# Patient Record
Sex: Female | Born: 1950 | Race: White | Hispanic: No | Smoking: Never smoker
Health system: Southern US, Community
[De-identification: ages and names within clinical notes are randomized; demographics above are authoritative.]

## PROBLEM LIST (undated history)

## (undated) DIAGNOSIS — G894 Chronic pain syndrome: Secondary | ICD-10-CM

## (undated) DIAGNOSIS — F11129 Opioid abuse with intoxication, unspecified: Secondary | ICD-10-CM

## (undated) DIAGNOSIS — F32A Depression, unspecified: Secondary | ICD-10-CM

## (undated) DIAGNOSIS — R29818 Other symptoms and signs involving the nervous system: Secondary | ICD-10-CM

## (undated) DIAGNOSIS — F1920 Other psychoactive substance dependence, uncomplicated: Secondary | ICD-10-CM

## (undated) DIAGNOSIS — M419 Scoliosis, unspecified: Secondary | ICD-10-CM

## (undated) DIAGNOSIS — Z8719 Personal history of other diseases of the digestive system: Secondary | ICD-10-CM

## (undated) DIAGNOSIS — F1190 Opioid use, unspecified, uncomplicated: Secondary | ICD-10-CM

## (undated) DIAGNOSIS — K219 Gastro-esophageal reflux disease without esophagitis: Secondary | ICD-10-CM

## (undated) DIAGNOSIS — R000 Tachycardia, unspecified: Secondary | ICD-10-CM

## (undated) DIAGNOSIS — M1990 Unspecified osteoarthritis, unspecified site: Secondary | ICD-10-CM

## (undated) DIAGNOSIS — R9431 Abnormal electrocardiogram [ECG] [EKG]: Secondary | ICD-10-CM

## (undated) DIAGNOSIS — M797 Fibromyalgia: Secondary | ICD-10-CM

## (undated) DIAGNOSIS — F419 Anxiety disorder, unspecified: Secondary | ICD-10-CM

## (undated) DIAGNOSIS — D649 Anemia, unspecified: Secondary | ICD-10-CM

## (undated) DIAGNOSIS — F1021 Alcohol dependence, in remission: Secondary | ICD-10-CM

## (undated) DIAGNOSIS — I10 Essential (primary) hypertension: Secondary | ICD-10-CM

## (undated) HISTORY — PX: TONSILLECTOMY: SUR1361

## (undated) HISTORY — PX: HERNIA REPAIR: SHX51

## (undated) HISTORY — PX: COSMETIC SURGERY: SHX468

## (undated) HISTORY — PX: MOUTH SURGERY: SHX715

## (undated) HISTORY — PX: FRACTURE SURGERY: SHX138

---

## 2014-01-03 DIAGNOSIS — F1021 Alcohol dependence, in remission: Secondary | ICD-10-CM | POA: Insufficient documentation

## 2014-06-08 DIAGNOSIS — I10 Essential (primary) hypertension: Secondary | ICD-10-CM | POA: Insufficient documentation

## 2014-07-31 DIAGNOSIS — M170 Bilateral primary osteoarthritis of knee: Secondary | ICD-10-CM | POA: Insufficient documentation

## 2014-07-31 DIAGNOSIS — J302 Other seasonal allergic rhinitis: Secondary | ICD-10-CM | POA: Insufficient documentation

## 2014-12-15 DIAGNOSIS — M129 Arthropathy, unspecified: Secondary | ICD-10-CM | POA: Insufficient documentation

## 2014-12-15 DIAGNOSIS — J309 Allergic rhinitis, unspecified: Secondary | ICD-10-CM | POA: Insufficient documentation

## 2014-12-15 DIAGNOSIS — F5104 Psychophysiologic insomnia: Secondary | ICD-10-CM | POA: Insufficient documentation

## 2014-12-23 DIAGNOSIS — M797 Fibromyalgia: Secondary | ICD-10-CM | POA: Insufficient documentation

## 2014-12-23 DIAGNOSIS — G894 Chronic pain syndrome: Secondary | ICD-10-CM | POA: Insufficient documentation

## 2015-12-17 ENCOUNTER — Other Ambulatory Visit: Payer: Self-pay | Admitting: Family Medicine

## 2015-12-17 DIAGNOSIS — Z1231 Encounter for screening mammogram for malignant neoplasm of breast: Secondary | ICD-10-CM

## 2016-01-07 ENCOUNTER — Encounter: Payer: Self-pay | Admitting: Radiology

## 2016-01-07 ENCOUNTER — Ambulatory Visit
Admission: RE | Admit: 2016-01-07 | Discharge: 2016-01-07 | Disposition: A | Discharge status: Home / Self Care | Payer: BLUE CROSS/BLUE SHIELD | Source: Ambulatory Visit | Attending: Family Medicine | Admitting: Family Medicine

## 2016-01-07 DIAGNOSIS — Z1231 Encounter for screening mammogram for malignant neoplasm of breast: Secondary | ICD-10-CM | POA: Diagnosis not present

## 2016-03-31 ENCOUNTER — Other Ambulatory Visit: Payer: Self-pay | Admitting: Family Medicine

## 2016-03-31 DIAGNOSIS — M8588 Other specified disorders of bone density and structure, other site: Secondary | ICD-10-CM

## 2016-06-02 ENCOUNTER — Emergency Department
Admission: EM | Admit: 2016-06-02 | Discharge: 2016-06-02 | Disposition: A | Discharge status: Home / Self Care | Payer: BLUE CROSS/BLUE SHIELD | Attending: Emergency Medicine | Admitting: Emergency Medicine

## 2016-06-02 ENCOUNTER — Emergency Department: Payer: BLUE CROSS/BLUE SHIELD

## 2016-06-02 ENCOUNTER — Encounter: Payer: Self-pay | Admitting: Emergency Medicine

## 2016-06-02 DIAGNOSIS — S0990XA Unspecified injury of head, initial encounter: Secondary | ICD-10-CM | POA: Diagnosis present

## 2016-06-02 DIAGNOSIS — W19XXXA Unspecified fall, initial encounter: Secondary | ICD-10-CM

## 2016-06-02 DIAGNOSIS — W01198A Fall on same level from slipping, tripping and stumbling with subsequent striking against other object, initial encounter: Secondary | ICD-10-CM | POA: Diagnosis not present

## 2016-06-02 DIAGNOSIS — S0083XA Contusion of other part of head, initial encounter: Secondary | ICD-10-CM | POA: Diagnosis not present

## 2016-06-02 DIAGNOSIS — Y929 Unspecified place or not applicable: Secondary | ICD-10-CM | POA: Diagnosis not present

## 2016-06-02 DIAGNOSIS — Z79899 Other long term (current) drug therapy: Secondary | ICD-10-CM | POA: Diagnosis not present

## 2016-06-02 DIAGNOSIS — Y999 Unspecified external cause status: Secondary | ICD-10-CM | POA: Diagnosis not present

## 2016-06-02 DIAGNOSIS — I10 Essential (primary) hypertension: Secondary | ICD-10-CM | POA: Diagnosis not present

## 2016-06-02 DIAGNOSIS — Y939 Activity, unspecified: Secondary | ICD-10-CM | POA: Insufficient documentation

## 2016-06-02 HISTORY — DX: Unspecified osteoarthritis, unspecified site: M19.90

## 2016-06-02 HISTORY — DX: Essential (primary) hypertension: I10

## 2016-06-02 HISTORY — DX: Fibromyalgia: M79.7

## 2016-06-02 NOTE — Discharge Instructions (Signed)

## 2016-06-02 NOTE — ED Notes (Signed)
Patient taken to CT scan.

## 2016-06-02 NOTE — ED Triage Notes (Signed)
Patient presents to ED via POV after a fall she had last night. Patient states she tripped and hit her head on a dresser. Patient states she went to bed and woke up with "raccoon eyes". Perorbital edema noted. Patient denies any symptoms at this time. No pain, dizziness, or vision changes. A&O x4. Patient takes a daily asa.

## 2016-06-02 NOTE — ED Provider Notes (Signed)
[facility] Emergency Department Provider Note  ____________________________________________  Time seen: Approximately 11:28 AM  I have reviewed the triage vital signs and the nursing notes.   HISTORY  Chief Complaint Fall   HPI Gina Matthews is a 66 y.o. female who presents for evaluation of face trauma status post mechanical fall. Patient reports yesterday evening she tripped on a towel on the floor and fell hitting her for head onto a drawer that was open on her vanity. She denies LOC. She takes aspirin but no other blood thinners. This morning she noticed significant oozing on her forehead and periorbitally bilaterally. She went to her PCP who sent her over here for imaging. She denies pain, dizziness, headache, face pain, changes in vision, chest pain, back pain, neck pain, abdominal pain, extremity pain. She denies feeling dizzy or having palpitations prior to her fall.  Past Medical History:  Diagnosis Date  . Arthritis   . Fibromyalgia   . Hypertension     There are no active problems to display for this patient.   History reviewed. No pertinent surgical history.  Prior to Admission medications   Medication Sig Start Date End Date Taking? Authorizing Provider  celecoxib (CELEBREX) 200 MG capsule Take 200 mg by mouth 2 (two) times daily. 05/14/16   Historical Provider, MD  DULoxetine (CYMBALTA) 30 MG capsule Take 30 mg by mouth daily. 05/21/16   Historical Provider, MD  gabapentin (NEURONTIN) 600 MG tablet Take 600 mg by mouth 3 (three) times daily. 04/03/16   Historical Provider, MD  lisinopril (PRINIVIL,ZESTRIL) 20 MG tablet Take 20 mg by mouth daily. 04/28/16   Historical Provider, MD  traMADol (ULTRAM) 50 MG tablet Take 50 mg by mouth every 8 (eight) hours as needed. 05/21/16   Historical Provider, MD  traZODone (DESYREL) 50 MG tablet Take 50 mg by mouth at bedtime. 05/18/16   Historical Provider, MD    Allergies Patient has no known  allergies.  Family History  Problem Relation Age of Onset  . Breast cancer Neg Hx     Social History Social History  Substance Use Topics  . Smoking status: Not on file  . Smokeless tobacco: Not on file  . Alcohol use Not on file    Review of Systems Constitutional: Negative for fever. Eyes: Negative for visual changes. ENT: Negative for facial injury or neck injury Cardiovascular: Negative for chest injury. Respiratory: Negative for shortness of breath. Negative for chest wall injury. Gastrointestinal: Negative for abdominal pain or injury. Genitourinary: Negative for dysuria. Musculoskeletal: Negative for back injury, negative for arm or leg pain. Skin: Negative for laceration/abrasions. Neurological: + head injury.   ____________________________________________   PHYSICAL EXAM:  VITAL SIGNS: ED Triage Vitals [06/02/16 1125]  Enc Vitals Group     BP (!) 147/102     Pulse Rate 93     Resp 17     Temp 98 F (36.7 C)     Temp Source Oral     SpO2 99 %     Weight 155 lb (70.3 kg)     Height 5\' 6"  (1.676 m)     Head Circumference      Peak Flow      Pain Score      Pain Loc      Pain Edu?      Excl. in GC?     Constitutional: Alert and oriented. No acute distress. Does not appear intoxicated. HEENT Head: Normocephalic, large forehead hematoma. Face: ttp over the  nasal bridge. Stable midface Ears: No hemotympanum bilaterally. No Battle sign Eyes: No eye injury. PERRL. B/l periorbital hematoma without tarsal plate sparing Nose: Nontender. No epistaxis. No rhinorrhea Mouth/Throat: Mucous membranes are moist. No oropharyngeal blood. No dental injury. Airway patent without stridor. Normal voice. Neck: no C-collar in place. No midline c-spine tenderness.  Cardiovascular: Normal rate, regular rhythm. Normal and symmetric distal pulses are present in all extremities. Pulmonary/Chest: Chest wall is stable and nontender to palpation/compression. Normal respiratory  effort. Breath sounds are normal. No crepitus.  Abdominal: Soft, nontender, non distended. Musculoskeletal: Nontender with normal full range of motion in all extremities. No deformities. No thoracic or lumbar midline spinal tenderness. Pelvis is stable. Skin: Skin is warm, dry and intact. No abrasions or contutions. Psychiatric: Speech and behavior are appropriate. Neurological: Normal speech and language. Moves all extremities to command. No gross focal neurologic deficits are appreciated.  Glascow Coma Score: 4 - Opens eyes on own 6 - Follows simple motor commands 5 - Alert and oriented GCS: 15   ____________________________________________   LABS (all labs ordered are listed, but only abnormal results are displayed)  Labs Reviewed - No data to display ____________________________________________  EKG  none  ____________________________________________  RADIOLOGY  CT head/ c-spine/ face: No acute intracranial abnormality.  Soft tissue swelling over the forehead and orbits. No facial or orbital fracture.  Severe kyphosis at the C3-4 level, likely related to severe degenerative disc disease. No acute bony abnormality. ____________________________________________   PROCEDURES  Procedure(s) performed: None Procedures Critical Care performed:  None ____________________________________________   INITIAL IMPRESSION / ASSESSMENT AND PLAN / ED COURSE   66 y.o. female who presents for evaluation of face trauma status post mechanical fall. Patient is neurologically intact, has a large forehead hematoma with bilateral periorbital hematoma without tarsal plate sparing. No signs or symptoms of basilar skull fracture. We'll get a head CT, CT cervical spine, and CT face.  Clinical Course as of Jun 02 1213  Tue Jun 02, 2016  1205 Imaging with no acute findings other than forehead hematoma. Will dc home on supportive care. Discuss signs and symptoms of delayed bleeding and  recommended patient returns to the ER if those develop.  [CV]    Clinical Course User Index [CV] Nita Sicklearolina Deunte Bledsoe, MD    Pertinent labs & imaging results that were available during my care of the patient were reviewed by me and considered in my medical decision making (see chart for details).    ____________________________________________   FINAL CLINICAL IMPRESSION(S) / ED DIAGNOSES  Final diagnoses:  Fall, initial encounter  Contusion of face, initial encounter  Traumatic hematoma of forehead, initial encounter      NEW MEDICATIONS STARTED DURING THIS VISIT:  New Prescriptions   No medications on file     Note:  This document was prepared using Dragon voice recognition software and may include unintentional dictation errors.    Nita Sicklearolina Kvion Shapley, MD 06/02/16 1215

## 2016-11-20 DIAGNOSIS — E871 Hypo-osmolality and hyponatremia: Secondary | ICD-10-CM | POA: Insufficient documentation

## 2017-02-22 ENCOUNTER — Other Ambulatory Visit: Payer: Self-pay | Admitting: Family Medicine

## 2017-02-22 DIAGNOSIS — Z1231 Encounter for screening mammogram for malignant neoplasm of breast: Secondary | ICD-10-CM

## 2017-03-26 ENCOUNTER — Ambulatory Visit
Admission: RE | Admit: 2017-03-26 | Discharge: 2017-03-26 | Disposition: A | Discharge status: Home / Self Care | Payer: BLUE CROSS/BLUE SHIELD | Source: Ambulatory Visit | Attending: Family Medicine | Admitting: Family Medicine

## 2017-03-26 DIAGNOSIS — Z1231 Encounter for screening mammogram for malignant neoplasm of breast: Secondary | ICD-10-CM | POA: Insufficient documentation

## 2017-03-30 ENCOUNTER — Other Ambulatory Visit: Payer: Self-pay | Admitting: Family Medicine

## 2017-03-30 DIAGNOSIS — R928 Other abnormal and inconclusive findings on diagnostic imaging of breast: Secondary | ICD-10-CM

## 2017-04-16 ENCOUNTER — Ambulatory Visit
Admission: RE | Admit: 2017-04-16 | Discharge: 2017-04-16 | Disposition: A | Discharge status: Home / Self Care | Payer: BLUE CROSS/BLUE SHIELD | Source: Ambulatory Visit | Attending: Family Medicine | Admitting: Family Medicine

## 2017-04-16 DIAGNOSIS — N6489 Other specified disorders of breast: Secondary | ICD-10-CM | POA: Insufficient documentation

## 2017-04-16 DIAGNOSIS — R928 Other abnormal and inconclusive findings on diagnostic imaging of breast: Secondary | ICD-10-CM | POA: Diagnosis present

## 2017-04-19 ENCOUNTER — Other Ambulatory Visit: Payer: Self-pay | Admitting: Family Medicine

## 2017-04-19 DIAGNOSIS — R921 Mammographic calcification found on diagnostic imaging of breast: Secondary | ICD-10-CM

## 2017-04-19 DIAGNOSIS — R928 Other abnormal and inconclusive findings on diagnostic imaging of breast: Secondary | ICD-10-CM

## 2017-04-22 ENCOUNTER — Ambulatory Visit
Admission: RE | Admit: 2017-04-22 | Discharge: 2017-04-22 | Disposition: A | Discharge status: Home / Self Care | Payer: BLUE CROSS/BLUE SHIELD | Source: Ambulatory Visit | Attending: Family Medicine | Admitting: Family Medicine

## 2017-04-22 ENCOUNTER — Other Ambulatory Visit: Payer: Self-pay | Admitting: Family Medicine

## 2017-04-22 DIAGNOSIS — R921 Mammographic calcification found on diagnostic imaging of breast: Secondary | ICD-10-CM | POA: Diagnosis not present

## 2017-04-22 DIAGNOSIS — R928 Other abnormal and inconclusive findings on diagnostic imaging of breast: Secondary | ICD-10-CM | POA: Insufficient documentation

## 2017-04-22 HISTORY — PX: BREAST BIOPSY: SHX20

## 2017-07-30 DIAGNOSIS — G8929 Other chronic pain: Secondary | ICD-10-CM | POA: Insufficient documentation

## 2017-07-30 DIAGNOSIS — M40204 Unspecified kyphosis, thoracic region: Secondary | ICD-10-CM | POA: Insufficient documentation

## 2017-07-30 DIAGNOSIS — M545 Low back pain, unspecified: Secondary | ICD-10-CM | POA: Insufficient documentation

## 2017-08-02 ENCOUNTER — Other Ambulatory Visit: Payer: Self-pay | Admitting: Pediatrics

## 2017-08-02 DIAGNOSIS — M40204 Unspecified kyphosis, thoracic region: Secondary | ICD-10-CM

## 2017-08-02 DIAGNOSIS — Z780 Asymptomatic menopausal state: Secondary | ICD-10-CM

## 2017-12-01 ENCOUNTER — Ambulatory Visit
Admission: RE | Admit: 2017-12-01 | Discharge: 2017-12-01 | Disposition: A | Discharge status: Home / Self Care | Payer: Medicare Other | Source: Ambulatory Visit | Attending: Family Medicine | Admitting: Family Medicine

## 2017-12-01 ENCOUNTER — Other Ambulatory Visit: Payer: Self-pay | Admitting: Family Medicine

## 2017-12-01 DIAGNOSIS — M25561 Pain in right knee: Principal | ICD-10-CM

## 2017-12-01 DIAGNOSIS — G8929 Other chronic pain: Secondary | ICD-10-CM

## 2018-02-15 ENCOUNTER — Other Ambulatory Visit: Payer: Self-pay | Admitting: Pediatrics

## 2018-02-15 DIAGNOSIS — Z780 Asymptomatic menopausal state: Secondary | ICD-10-CM

## 2018-03-09 ENCOUNTER — Encounter: Payer: Self-pay | Admitting: Emergency Medicine

## 2018-03-09 ENCOUNTER — Emergency Department: Payer: Medicare Other

## 2018-03-09 ENCOUNTER — Other Ambulatory Visit: Payer: Self-pay

## 2018-03-09 ENCOUNTER — Emergency Department
Admission: EM | Admit: 2018-03-09 | Discharge: 2018-03-09 | Disposition: A | Discharge status: Home / Self Care | Payer: Medicare Other | Attending: Emergency Medicine | Admitting: Emergency Medicine

## 2018-03-09 DIAGNOSIS — Z041 Encounter for examination and observation following transport accident: Secondary | ICD-10-CM | POA: Diagnosis not present

## 2018-03-09 DIAGNOSIS — R51 Headache: Secondary | ICD-10-CM | POA: Diagnosis present

## 2018-03-09 DIAGNOSIS — I10 Essential (primary) hypertension: Secondary | ICD-10-CM | POA: Insufficient documentation

## 2018-03-09 DIAGNOSIS — Z79899 Other long term (current) drug therapy: Secondary | ICD-10-CM | POA: Insufficient documentation

## 2018-03-09 DIAGNOSIS — M549 Dorsalgia, unspecified: Secondary | ICD-10-CM | POA: Diagnosis not present

## 2018-03-09 MED ORDER — METHOCARBAMOL 500 MG PO TABS: 500.0000 mg | ORAL_TABLET | Freq: Three times a day (TID) | ORAL | 0 refills | Status: AC | PRN

## 2018-03-09 MED ORDER — ACETAMINOPHEN 325 MG PO TABS
650.0000 mg | ORAL_TABLET | Freq: Once | ORAL | Status: AC
  Administered 2018-03-09: 650 mg via ORAL
  Filled 2018-03-09: qty 2
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
## 2018-03-09 NOTE — ED Notes (Signed)
Pt reports tightness with inspiration but no sharp pains
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
## 2018-03-09 NOTE — ED Notes (Signed)
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
## 2018-06-20 ENCOUNTER — Other Ambulatory Visit: Payer: Medicare Other

## 2018-09-09 ENCOUNTER — Other Ambulatory Visit: Payer: Self-pay

## 2018-09-09 ENCOUNTER — Emergency Department: Payer: Medicare Other

## 2018-09-09 DIAGNOSIS — M549 Dorsalgia, unspecified: Secondary | ICD-10-CM | POA: Diagnosis not present

## 2018-09-09 DIAGNOSIS — Z79899 Other long term (current) drug therapy: Secondary | ICD-10-CM | POA: Diagnosis not present

## 2018-09-09 DIAGNOSIS — M79604 Pain in right leg: Secondary | ICD-10-CM | POA: Insufficient documentation

## 2018-09-09 DIAGNOSIS — M25561 Pain in right knee: Secondary | ICD-10-CM | POA: Insufficient documentation

## 2018-09-09 DIAGNOSIS — G8929 Other chronic pain: Secondary | ICD-10-CM | POA: Insufficient documentation

## 2018-09-09 DIAGNOSIS — I10 Essential (primary) hypertension: Secondary | ICD-10-CM | POA: Diagnosis not present

## 2018-09-09 DIAGNOSIS — M5431 Sciatica, right side: Secondary | ICD-10-CM | POA: Insufficient documentation

## 2018-09-09 NOTE — ED Notes (Signed)
Patient transported to Ultrasound 

## 2018-09-09 NOTE — ED Triage Notes (Signed)
Patient reports right leg pain that started at knee and radiates up into hip and down to foot.  Also reports feels numb and tingling.

## 2018-09-10 ENCOUNTER — Emergency Department
Admission: EM | Admit: 2018-09-10 | Discharge: 2018-09-10 | Disposition: A | Discharge status: Home / Self Care | Payer: Medicare Other | Attending: Emergency Medicine | Admitting: Emergency Medicine

## 2018-09-10 ENCOUNTER — Emergency Department: Payer: Medicare Other

## 2018-09-10 DIAGNOSIS — M25561 Pain in right knee: Secondary | ICD-10-CM | POA: Diagnosis not present

## 2018-09-10 DIAGNOSIS — M5431 Sciatica, right side: Secondary | ICD-10-CM

## 2018-09-10 LAB — CBC WITH DIFFERENTIAL/PLATELET
Abs Immature Granulocytes: 0.02 10*3/uL (ref 0.00–0.07)
Basophils Absolute: 0 10*3/uL (ref 0.0–0.1)
Basophils Relative: 0 %
Eosinophils Absolute: 0 10*3/uL (ref 0.0–0.5)
Eosinophils Relative: 1 %
HCT: 34.4 % — ABNORMAL LOW (ref 36.0–46.0)
Hemoglobin: 10.9 g/dL — ABNORMAL LOW (ref 12.0–15.0)
Immature Granulocytes: 0 %
Lymphocytes Relative: 11 %
Lymphs Abs: 0.7 10*3/uL (ref 0.7–4.0)
MCH: 29 pg (ref 26.0–34.0)
MCHC: 31.7 g/dL (ref 30.0–36.0)
MCV: 91.5 fL (ref 80.0–100.0)
Monocytes Absolute: 0.8 10*3/uL (ref 0.1–1.0)
Monocytes Relative: 12 %
Neutro Abs: 4.8 10*3/uL (ref 1.7–7.7)
Neutrophils Relative %: 76 %
Platelets: 303 10*3/uL (ref 150–400)
RBC: 3.76 MIL/uL — ABNORMAL LOW (ref 3.87–5.11)
RDW: 13.2 % (ref 11.5–15.5)
WBC: 6.4 10*3/uL (ref 4.0–10.5)
nRBC: 0 % (ref 0.0–0.2)

## 2018-09-10 LAB — BASIC METABOLIC PANEL
Anion gap: 12 (ref 5–15)
BUN: 13 mg/dL (ref 8–23)
CO2: 20 mmol/L — ABNORMAL LOW (ref 22–32)
Calcium: 9.1 mg/dL (ref 8.9–10.3)
Chloride: 100 mmol/L (ref 98–111)
Creatinine, Ser: 0.59 mg/dL (ref 0.44–1.00)
GFR calc Af Amer: >60 mL/min (ref >60)
GFR calc non Af Amer: >60 mL/min (ref >60)
Glucose, Bld: 114 mg/dL — ABNORMAL HIGH (ref 70–99)
Potassium: 5 mmol/L (ref 3.5–5.1)
Sodium: 132 mmol/L — ABNORMAL LOW (ref 135–145)

## 2018-09-10 MED ORDER — OXYCODONE HCL 5 MG PO TABS: 5.0000 mg | ORAL_TABLET | Freq: Once | ORAL | Status: DC

## 2018-09-10 MED ORDER — FENTANYL CITRATE (PF) 100 MCG/2ML IJ SOLN
100.0000 ug | Freq: Once | INTRAMUSCULAR | Status: AC
  Administered 2018-09-10: 100 ug via INTRAVENOUS
  Filled 2018-09-10: qty 2

## 2018-09-10 MED ORDER — ONDANSETRON HCL 4 MG/2ML IJ SOLN
4.0000 mg | Freq: Once | INTRAMUSCULAR | Status: AC
  Administered 2018-09-10: 4 mg via INTRAVENOUS
  Filled 2018-09-10: qty 2

## 2018-09-10 MED ORDER — IOHEXOL 300 MG/ML  SOLN
100.0000 mL | Freq: Once | INTRAMUSCULAR | Status: AC | PRN
  Administered 2018-09-10: 100 mL via INTRAVENOUS

## 2018-09-10 MED ORDER — ACETAMINOPHEN 500 MG PO TABS: 1000.0000 mg | ORAL_TABLET | Freq: Once | ORAL | Status: DC

## 2018-09-10 NOTE — ED Provider Notes (Signed)
CT of the knee shows a Baker's cyst CT of the back shows DJD.  No acute changes will have patient follow-up with her doctor as planned.  No changes to prior discussion with patient by Dr. Luberta Robertson, Regan Rakers, MD 09/10/18 [phone]

## 2018-09-10 NOTE — ED Provider Notes (Signed)
[facility] Emergency Department Provider Note  ____________________________________________  Time seen: Approximately 5:45 AM  I have reviewed the triage vital signs and the nursing notes.   HISTORY  Chief Complaint Leg Pain   HPI Gina Matthews is a 68 y.o. female with a history of chronic bilateral knee pain, arthritis, fibromyalgia, scoliosis, chronic back pain who presents for evaluation of right knee pain.  Patient reports the pain started at 4 PM today.  She reports that she always has pain in her knees and she thought it was due to arthritis.  Usually she takes her tramadol and nonsteroidal at home and is able to manage the pain but today the pain was not manageable.  The pain is severe, worse with weightbearing, located behind her right knee radiating up and down the leg.  She is also complained of tingling of the lower leg which is new for her. No changes in her chronic back pain. No saddle anesthesia, weakness of bilateral lower extremities, urinary or bowel incontinence or retention.  Past Medical History:  Diagnosis Date  . Arthritis   . Fibromyalgia   . Hypertension     There are no active problems to display for this patient.   Past Surgical History:  Procedure Laterality Date  . BREAST BIOPSY Right 04/22/2017   Affirm Bx- path pending    Prior to Admission medications   Medication Sig Start Date End Date Taking? Authorizing Provider  celecoxib (CELEBREX) 200 MG capsule Take 200 mg by mouth 2 (two) times daily. 05/14/16   [provider]  DULoxetine (CYMBALTA) 30 MG capsule Take 30 mg by mouth daily. 05/21/16   [provider]  gabapentin (NEURONTIN) 600 MG tablet Take 600 mg by mouth 3 (three) times daily. 04/03/16   [provider]  lisinopril (PRINIVIL,ZESTRIL) 20 MG tablet Take 20 mg by mouth daily. 04/28/16   [provider]  traMADol (ULTRAM) 50 MG tablet Take 50 mg by mouth every 8 (eight) hours as  needed. 05/21/16   [provider]  traZODone (DESYREL) 50 MG tablet Take 50 mg by mouth at bedtime. 05/18/16   [provider]    Allergies Patient has no known allergies.  Family History  Problem Relation Age of Onset  . Breast cancer Neg Hx     Social History Social History   Tobacco Use  . Smoking status: Never Smoker  . Smokeless tobacco: Never Used  Substance Use Topics  . Alcohol use: Never    Frequency: Never  . Drug use: Never    Review of Systems  Constitutional: Negative for fever. Eyes: Negative for visual changes. ENT: Negative for sore throat. Neck: No neck pain  Cardiovascular: Negative for chest pain. Respiratory: Negative for shortness of breath. Gastrointestinal: Negative for abdominal pain, vomiting or diarrhea. Genitourinary: Negative for dysuria. Musculoskeletal: Negative for back pain. + R knee pain Skin: Negative for rash. Neurological: Negative for headaches, weakness or numbness. Psych: No SI or HI  ____________________________________________   PHYSICAL EXAM:  VITAL SIGNS: ED Triage Vitals  Enc Vitals Group     BP 09/09/18 2211 (!) 121/102     Pulse Rate 09/09/18 2211 76     Resp 09/09/18 2211 (!) 22     Temp 09/09/18 2211 97.8 F (36.6 C)     Temp Source 09/09/18 2211 Oral     SpO2 09/09/18 2211 100 %     Weight 09/09/18 2203 160 lb (72.6 kg)     Height 09/09/18  2203 5\' 5"  (1.651 m)     Head Circumference --      Peak Flow --      Pain Score --      Pain Loc --      Pain Edu? --      Excl. in GC? --     Constitutional: Alert and oriented, looks uncomfortable due to pain. HEENT:      Head: Normocephalic and atraumatic.         Eyes: Conjunctivae are normal. Sclera is non-icteric.       Mouth/Throat: Mucous membranes are moist.       Neck: Supple with no signs of meningismus. Cardiovascular: Regular rate and rhythm. No murmurs, gallops, or rubs. 2+ symmetrical distal pulses are present in all extremities. No  JVD. Respiratory: Normal respiratory effort. Lungs are clear to auscultation bilaterally. No wheezes, crackles, or rhonchi.  Gastrointestinal: Soft, non tender, and non distended with positive bowel sounds. No rebound or guarding. Musculoskeletal: Large palpable mass in the R popliteal fossa, no erythema or warmth, not particularly tender to palpation, patient has significant ttp over the R sciatica notch. No midline C/T/L spine tenderness Neurologic: Normal speech and language. Face is symmetric. Moving all extremities. No gross focal neurologic deficits are appreciated. Skin: Skin is warm, dry and intact. No rash noted. Psychiatric: Mood and affect are normal. Speech and behavior are normal.  ____________________________________________   LABS (all labs ordered are listed, but only abnormal results are displayed)  Labs Reviewed  CBC WITH DIFFERENTIAL/PLATELET - Abnormal; Notable for the following components:      Result Value   RBC 3.76 (*)    Hemoglobin 10.9 (*)    HCT 34.4 (*)    All other components within normal limits  BASIC METABOLIC PANEL - Abnormal; Notable for the following components:   Sodium 132 (*)    CO2 20 (*)    Glucose, Bld 114 (*)    All other components within normal limits   ____________________________________________  EKG  none  ____________________________________________  RADIOLOGY  I have personally reviewed the images performed during this visit and I agree with the Radiologist's read.   Interpretation by Radiologist:  [place] Venous Img Lower Unilateral Right  Result Date: 09/09/2018 CLINICAL DATA:  Right leg pain with numbness. EXAM: RIGHT LOWER EXTREMITY VENOUS DOPPLER ULTRASOUND TECHNIQUE: Gray-scale sonography with graded compression, as well as color Doppler and duplex ultrasound were performed to evaluate the lower extremity deep venous systems from the level of the common femoral vein and including the common femoral, femoral, profunda femoral,  popliteal and calf veins including the posterior tibial, peroneal and gastrocnemius veins when visible. The superficial great saphenous vein was also interrogated. Spectral Doppler was utilized to evaluate flow at rest and with distal augmentation maneuvers in the common femoral, femoral and popliteal veins. COMPARISON:  None. FINDINGS: Contralateral Common Femoral Vein: Respiratory phasicity is normal and symmetric with the symptomatic side. No evidence of thrombus. Normal compressibility. Common Femoral Vein: No evidence of thrombus. Normal compressibility, respiratory phasicity and response to augmentation. Saphenofemoral Junction: No evidence of thrombus. Normal compressibility and flow on color Doppler imaging. Profunda Femoral Vein: No evidence of thrombus. Normal compressibility and flow on color Doppler imaging. Femoral Vein: No evidence of thrombus. Normal compressibility, respiratory phasicity and response to augmentation. Popliteal Vein: No evidence of thrombus. Normal compressibility, respiratory phasicity and response to augmentation. Calf Veins: No evidence of thrombus. Normal compressibility and flow on color Doppler imaging. Superficial Great Saphenous Vein: No  evidence of thrombus. Normal compressibility. Venous Reflux:  None. Other Findings: There is a complex fluid collection within the right popliteal fossa measuring 5.2 x 3.2 x 3.4 cm. IMPRESSION: No evidence of deep venous thrombosis. Large complex fluid collection within the right popliteal fossa. Electronically Signed   By: Ted Mcalpineobrinka  Dimitrova M.D.   On: 09/09/2018 22:59     ____________________________________________   PROCEDURES  Procedure(s) performed: None Procedures Critical Care performed:  None ____________________________________________   INITIAL IMPRESSION / ASSESSMENT AND PLAN / ED COURSE  68 y.o. female with a history of chronic bilateral knee pain, arthritis, fibromyalgia, scoliosis, chronic back pain who  presents for evaluation of right knee pain.  Patient initially complaining of right knee pain but now she is not sure if the pain is coming from her buttock down to her leg one from her knee up to her buttock.  She has a large palpable mobile mass in her right popliteal fossa.  According to patient she has had this mass for a long time however seems bigger.  There is no erythema, warmth.  Limb is warm and well-perfused.  Motor exam is limited due to pain.  She is significantly tender to palpation over the right sciatic notch which reproduces her pain but no midline spine tenderness.  She has bilateral 2+ patella DTRs.  Doppler ultrasound was done prior to my evaluation showing a large complex fluid collection measuring 5 x 3 x 3 cm in the right popliteal fossa.  This is most likely a Baker's cyst however will get a CT to rule out abscess or malignancy.  He seems based on my exam that her pain is mostly sciatica with positive straight leg raise and tenderness over the right sciatic notch.  She has had problems with sciatica pain in the past.  There is no signs of cauda equina. CT of the knee and CT of the lumbar spine pending. Patient given fentanyl for pain.     _________________________ 6:59 AM on 09/10/2018 -----------------------------------------  CTs pending. Care transferred to Dr. Darnelle CatalanMalinda.       As part of my medical decision making, I reviewed the following data within the electronic MEDICAL RECORD NUMBER History obtained from family, Labs reviewed , Old chart reviewed, Radiograph reviewed , Notes from prior ED visits and [place] Controlled Substance Database    Pertinent labs & imaging results that were available during my care of the patient were reviewed by me and considered in my medical decision making (see chart for details).    ____________________________________________   FINAL CLINICAL IMPRESSION(S) / ED DIAGNOSES  Final diagnoses:  Right knee pain, unspecified chronicity  Right  sided sciatica      NEW MEDICATIONS STARTED DURING THIS VISIT:  ED Discharge Orders    None       Note:  This document was prepared using Dragon voice recognition software and may include unintentional dictation errors.    Don PerkingVeronese, [place], MD 09/10/18 0700

## 2018-09-10 NOTE — ED Notes (Signed)
Resting, await CT results.

## 2018-09-10 NOTE — Discharge Instructions (Addendum)
Take your pain medications as prescribed.  Return to the emergency room if you develop back pain, weakness of your legs, numbness in your groin region when wiping or cleaning, difficulty going to the bathroom for urination or bowel movement or if you become incontinent.  Otherwise follow-up with your doctor for further evaluation.

## 2018-09-10 NOTE — ED Notes (Signed)
Patient given update on wait time. Patient verbalizes understanding.  

## 2018-09-14 ENCOUNTER — Other Ambulatory Visit: Payer: Medicare Other

## 2018-10-01 DIAGNOSIS — S02401A Maxillary fracture, unspecified, initial encounter for closed fracture: Secondary | ICD-10-CM | POA: Insufficient documentation

## 2018-10-01 DIAGNOSIS — R55 Syncope and collapse: Secondary | ICD-10-CM | POA: Insufficient documentation

## 2018-10-01 DIAGNOSIS — S0993XA Unspecified injury of face, initial encounter: Secondary | ICD-10-CM | POA: Insufficient documentation

## 2018-10-01 DIAGNOSIS — R9431 Abnormal electrocardiogram [ECG] [EKG]: Secondary | ICD-10-CM | POA: Insufficient documentation

## 2018-11-01 DIAGNOSIS — M1711 Unilateral primary osteoarthritis, right knee: Secondary | ICD-10-CM | POA: Insufficient documentation

## 2019-04-24 ENCOUNTER — Other Ambulatory Visit: Payer: Self-pay | Admitting: Pediatrics

## 2019-04-24 DIAGNOSIS — Z780 Asymptomatic menopausal state: Secondary | ICD-10-CM

## 2019-04-26 ENCOUNTER — Other Ambulatory Visit: Payer: Self-pay | Admitting: Pediatrics

## 2019-04-26 DIAGNOSIS — R928 Other abnormal and inconclusive findings on diagnostic imaging of breast: Secondary | ICD-10-CM

## 2019-04-28 ENCOUNTER — Other Ambulatory Visit: Payer: Self-pay | Admitting: Pediatrics

## 2019-04-28 DIAGNOSIS — Z1231 Encounter for screening mammogram for malignant neoplasm of breast: Secondary | ICD-10-CM

## 2019-04-28 DIAGNOSIS — R928 Other abnormal and inconclusive findings on diagnostic imaging of breast: Secondary | ICD-10-CM

## 2019-05-11 ENCOUNTER — Other Ambulatory Visit: Payer: Medicare Other

## 2019-05-30 ENCOUNTER — Other Ambulatory Visit: Payer: Medicare Other

## 2019-06-22 ENCOUNTER — Ambulatory Visit
Admission: RE | Admit: 2019-06-22 | Discharge: 2019-06-22 | Disposition: A | Discharge status: Home / Self Care | Payer: Medicare Other | Source: Ambulatory Visit | Attending: Pediatrics | Admitting: Pediatrics

## 2019-06-22 DIAGNOSIS — R928 Other abnormal and inconclusive findings on diagnostic imaging of breast: Secondary | ICD-10-CM | POA: Diagnosis present

## 2019-06-22 DIAGNOSIS — Z1231 Encounter for screening mammogram for malignant neoplasm of breast: Secondary | ICD-10-CM

## 2019-06-22 DIAGNOSIS — Z780 Asymptomatic menopausal state: Secondary | ICD-10-CM | POA: Diagnosis present

## 2019-06-27 ENCOUNTER — Other Ambulatory Visit: Payer: Medicare Other

## 2019-07-17 DIAGNOSIS — M810 Age-related osteoporosis without current pathological fracture: Secondary | ICD-10-CM | POA: Insufficient documentation

## 2019-11-23 DIAGNOSIS — F331 Major depressive disorder, recurrent, moderate: Secondary | ICD-10-CM | POA: Insufficient documentation

## 2020-06-28 DIAGNOSIS — R000 Tachycardia, unspecified: Secondary | ICD-10-CM | POA: Insufficient documentation

## 2020-07-24 ENCOUNTER — Other Ambulatory Visit: Payer: Self-pay | Admitting: Pediatrics

## 2020-07-24 DIAGNOSIS — Z1231 Encounter for screening mammogram for malignant neoplasm of breast: Secondary | ICD-10-CM

## 2020-07-29 ENCOUNTER — Other Ambulatory Visit: Payer: Self-pay

## 2020-07-29 ENCOUNTER — Ambulatory Visit
Admission: RE | Admit: 2020-07-29 | Discharge: 2020-07-29 | Disposition: A | Discharge status: Home / Self Care | Payer: Medicare Other | Source: Ambulatory Visit | Attending: Pediatrics | Admitting: Pediatrics

## 2020-07-29 DIAGNOSIS — Z1231 Encounter for screening mammogram for malignant neoplasm of breast: Secondary | ICD-10-CM | POA: Insufficient documentation

## 2020-10-03 DIAGNOSIS — K219 Gastro-esophageal reflux disease without esophagitis: Secondary | ICD-10-CM | POA: Insufficient documentation

## 2020-10-03 DIAGNOSIS — K449 Diaphragmatic hernia without obstruction or gangrene: Secondary | ICD-10-CM | POA: Insufficient documentation

## 2021-01-17 NOTE — Discharge Instructions (Signed)
Instructions after Total Knee Replacement   Gina Matthews, Jr., M.D.     Dept. of Orthopaedics & Sports Medicine  [facility]  [address]  [place], [place]  [address]  Phone: [phone]   Fax: [phone]    DIET: Drink plenty of non-alcoholic fluids. Resume your normal diet. Include foods high in fiber.  ACTIVITY:  You may use crutches or a walker with weight-bearing as tolerated, unless instructed otherwise. You may be weaned off of the walker or crutches by your Physical Therapist.  Do NOT place pillows under the knee. Anything placed under the knee could limit your ability to straighten the knee.   Continue doing gentle exercises. Exercising will reduce the pain and swelling, increase motion, and prevent muscle weakness.   Please continue to use the TED compression stockings for 6 weeks. You may remove the stockings at night, but should reapply them in the morning. Do not drive or operate any equipment until instructed.  WOUND CARE:  Continue to use the PolarCare or ice packs periodically to reduce pain and swelling. You may bathe or shower after the staples are removed at the first office visit following surgery.  MEDICATIONS: You may resume your regular medications. Please take the pain medication as prescribed on the medication. Do not take pain medication on an empty stomach. You have been given a prescription for a blood thinner (Lovenox or Coumadin). Please take the medication as instructed. (NOTE: After completing a 2 week course of Lovenox, take one Enteric-coated aspirin once a day. This along with elevation will help reduce the possibility of phlebitis in your operated leg.) Do not drive or drink alcoholic beverages when taking pain medications.  CALL THE OFFICE FOR: Temperature above 101 degrees Excessive bleeding or drainage on the dressing. Excessive swelling, coldness, or paleness of the toes. Persistent nausea and vomiting.  FOLLOW-UP:  You  should have an appointment to return to the office in 10-14 days after surgery. Arrangements have been made for continuation of Physical Therapy (either home therapy or outpatient therapy).   [facility] Department Directory         [email or web address]       [email or web address]          Cardiology  Appointments: [place] - [phone] [place] - [phone]  Endocrinology  Appointments: [place] - [phone] [place] - [phone]  Gastroenterology  Appointments: [place] - [phone] [place] - [phone]        General Surgery   Appointments: [place] - [phone]  Internal Medicine/Family Medicine  Appointments: [place] - [phone] [place] - [phone] [place] - [phone]  Metabolic and Weigh Loss Surgery  Appointments: [place] - [phone]        Neurology  Appointments: [place] - [phone] [place] - [phone]  Neurosurgery  Appointments: [place] - [phone]  Obstetrics & Gynecology  Appointments: [place] - [phone] [place] - [phone]        Pediatrics  Appointments: [place] - [phone] [place] - [phone]  Physiatry  Appointments: [place] -[phone]  Physical Therapy  Appointments: [place] - [phone] [place] - [phone]        Podiatry  Appointments: [place] - [phone] [place] - [phone]  Pulmonology  Appointments: [place] - [phone]  Rheumatology  Appointments: [place] - [phone]        [place] Location: [facility]  [address] [place], [place]  [address]  [place] Location: [facility] [address] [place], [place]  [address]  [place] Location: [facility] [address] [place],   [address]    

## 2021-01-21 ENCOUNTER — Other Ambulatory Visit: Payer: Self-pay

## 2021-01-21 ENCOUNTER — Encounter
Admission: RE | Admit: 2021-01-21 | Discharge: 2021-01-21 | Disposition: A | Discharge status: Home / Self Care | Payer: Medicare Other | Source: Ambulatory Visit | Attending: Orthopedic Surgery | Admitting: Orthopedic Surgery

## 2021-01-21 VITALS — BP 164/86 | HR 70 | Temp 97.2°F | Resp 16 | Ht 63.0 in | Wt 150.0 lb

## 2021-01-21 DIAGNOSIS — Z791 Long term (current) use of non-steroidal anti-inflammatories (NSAID): Secondary | ICD-10-CM | POA: Diagnosis not present

## 2021-01-21 DIAGNOSIS — M1711 Unilateral primary osteoarthritis, right knee: Secondary | ICD-10-CM | POA: Insufficient documentation

## 2021-01-21 DIAGNOSIS — Z01818 Encounter for other preprocedural examination: Secondary | ICD-10-CM | POA: Diagnosis present

## 2021-01-21 DIAGNOSIS — I10 Essential (primary) hypertension: Secondary | ICD-10-CM | POA: Diagnosis not present

## 2021-01-21 DIAGNOSIS — Z01812 Encounter for preprocedural laboratory examination: Secondary | ICD-10-CM

## 2021-01-21 DIAGNOSIS — Z01810 Encounter for preprocedural cardiovascular examination: Secondary | ICD-10-CM | POA: Diagnosis not present

## 2021-01-21 HISTORY — DX: Anemia, unspecified: D64.9

## 2021-01-21 HISTORY — DX: Personal history of other diseases of the digestive system: Z87.19

## 2021-01-21 LAB — COMPREHENSIVE METABOLIC PANEL
ALT: 21 U/L (ref 0–44)
AST: 21 U/L (ref 15–41)
Albumin: 4 g/dL (ref 3.5–5.0)
Alkaline Phosphatase: 86 U/L (ref 38–126)
Anion gap: 9 (ref 5–15)
BUN: 10 mg/dL (ref 8–23)
CO2: 22 mmol/L (ref 22–32)
Calcium: 8.8 mg/dL — ABNORMAL LOW (ref 8.9–10.3)
Chloride: 97 mmol/L — ABNORMAL LOW (ref 98–111)
Creatinine, Ser: 0.46 mg/dL (ref 0.44–1.00)
GFR, Estimated: >60 mL/min (ref >60)
Glucose, Bld: 85 mg/dL (ref 70–99)
Potassium: 4.5 mmol/L (ref 3.5–5.1)
Sodium: 128 mmol/L — ABNORMAL LOW (ref 135–145)
Total Bilirubin: 0.7 mg/dL (ref 0.3–1.2)
Total Protein: 6.7 g/dL (ref 6.5–8.1)

## 2021-01-21 LAB — CBC
HCT: 39.7 % (ref 36.0–46.0)
Hemoglobin: 13.4 g/dL (ref 12.0–15.0)
MCH: 32.2 pg (ref 26.0–34.0)
MCHC: 33.8 g/dL (ref 30.0–36.0)
MCV: 95.4 fL (ref 80.0–100.0)
Platelets: 316 10*3/uL (ref 150–400)
RBC: 4.16 MIL/uL (ref 3.87–5.11)
RDW: 11.8 % (ref 11.5–15.5)
WBC: 4.6 10*3/uL (ref 4.0–10.5)
nRBC: 0 % (ref 0.0–0.2)

## 2021-01-21 LAB — SEDIMENTATION RATE: Sed Rate: 4 mm/hr (ref 0–30)

## 2021-01-21 LAB — TYPE AND SCREEN
ABO/RH(D): B POS
Antibody Screen: NEGATIVE

## 2021-01-21 LAB — URINALYSIS, ROUTINE W REFLEX MICROSCOPIC
Bilirubin Urine: NEGATIVE
Glucose, UA: NEGATIVE mg/dL
Hgb urine dipstick: NEGATIVE
Ketones, ur: NEGATIVE mg/dL
Leukocytes,Ua: NEGATIVE
Nitrite: NEGATIVE
Protein, ur: NEGATIVE mg/dL
Specific Gravity, Urine: 1.02 (ref 1.005–1.030)
pH: 6 (ref 5.0–8.0)

## 2021-01-21 LAB — C-REACTIVE PROTEIN: CRP: 0.5 mg/dL (ref <1.0)

## 2021-01-21 LAB — SURGICAL PCR SCREEN
MRSA, PCR: NEGATIVE
Staphylococcus aureus: NEGATIVE

## 2021-01-21 NOTE — Patient Instructions (Addendum)
Your procedure is scheduled on: Mon. 11/7 Report to registration desk in the medical mall then to Day Surgery. To find out your arrival time please call [phone] between 1PM - 3PM on Friday 11/4  Remember: Instructions that are not followed completely may result in serious medical risk,  up to and including death, or upon the discretion of your surgeon and anesthesiologist your  surgery may need to be rescheduled.     _X__ 1. Do not eat food after midnight the night before your procedure.                 No chewing gum or hard candies. You may drink clear liquids up to 2 hours                 before you are scheduled to arrive for your surgery- DO not drink clear                 liquids within 2 hours of the start of your surgery.                 Clear Liquids include:  water, apple juice without pulp, clear Gatorade, G2 or                  Gatorade Zero (avoid Red/Purple/Blue), Black Coffee or Tea (Do not add                 anything to coffee or tea). ___x__2.   Complete the "Ensure Clear Pre-surgery Clear Carbohydrate Drink"provided to you, 2 hours before arrival. **If you are diabetic you will be  provided with an alternative drink, Gatorade Zero or G2.  __X__2.  On the morning of surgery brush your teeth with toothpaste and water, you                may rinse your mouth with mouthwash if you wish.  Do not swallow any   toothpaste of mouthwash.     ___ 3.  No Alcohol for 24 hours before or after surgery.   ___ 4.  Do Not Smoke or use e-cigarettes For 24 Hours Prior to Your Surgery.                 Do not use any chewable tobacco products for at least 6 hours prior to                 Surgery.  __  5.  Do not use any recreational drugs (marijuana, cocaine, heroin, ecstasy,  MDMA or other) For at least one week prior to your surgery.    Combination of these drugs with anesthesia may have life threatening results.  ____  6.  Bring all medications with you on  the day of surgery if instructed.   __x__  7.  Notify your doctor if there is any change in your medical condition      (cold, fever, infections).     Do not wear jewelry, make-up, hairpins, clips or nail polish. Do not wear lotions, powders, or perfumes. You may wear deodorant. Do not shave 48 hours prior to surgery. Men may shave face and neck. Do not bring valuables to the hospital.    [facility] is not responsible for any belongings or valuables.  Contacts, dentures or bridgework may not be worn into surgery. Leave your suitcase in the car. After surgery it may be brought to your room. For patients admitted to the hospital, discharge time is determined  by your treatment team.   Patients discharged the day of surgery will not be allowed to drive home.   Make arrangements for someone to be with you for the first 24 hours of your Same Day Discharge.    Please read over the following fact sheets that you were given:   Incentive pirometer    _x___ Take these medicines the morning of surgery with A SIP OF WATER: Will Receive Gabapentin and Celebrex in Preop   1.  2. carvedilol (COREG) 6.25 MG tablet  3. cetirizine (ZYRTEC) 10 MG tablet if needed  4.DULoxetine HCl 60 MG CSDR  5.traMADol (ULTRAM) 50 MG tablet if needed  6.  ____ Fleet Enema (as directed)   __x__ Use CHG Soap (or wipes) as directed  ____ Use Benzoyl Peroxide Gel as instructed  ____ Use inhalers on the day of surgery  ____ Stop metformin 2 days prior to surgery    ____ Take 1/2 of usual insulin dose the night before surgery. No insulin the morning          of surgery.   ____ Stop Coumadin/Plavix/aspirin on   __x__ Stop Anti-inflammatories ibuprofen (ADVIL) 200 MG tablet aleve or aspirin products until after surgery   __x__ Stop supplements until after surgery.  Biotin 81275 MCG TABS, Multiple Vitamins-Minerals (MULTIVITAMIN WITH MINERALS) tablet  ____ Bring C-Pap to the hospital.    If you have any  questions regarding your pre-procedure instructions,  Please call Pre-admit Testing at [phone]

## 2021-01-23 ENCOUNTER — Other Ambulatory Visit
Admission: RE | Admit: 2021-01-23 | Discharge: 2021-01-23 | Disposition: A | Discharge status: Home / Self Care | Payer: Medicare Other | Source: Ambulatory Visit | Attending: Orthopedic Surgery | Admitting: Orthopedic Surgery

## 2021-01-23 ENCOUNTER — Other Ambulatory Visit: Payer: Self-pay

## 2021-01-23 DIAGNOSIS — Z20822 Contact with and (suspected) exposure to covid-19: Secondary | ICD-10-CM | POA: Insufficient documentation

## 2021-01-23 DIAGNOSIS — Z01812 Encounter for preprocedural laboratory examination: Secondary | ICD-10-CM | POA: Insufficient documentation

## 2021-01-24 LAB — SARS CORONAVIRUS 2 (TAT 6-24 HRS): SARS Coronavirus 2: NEGATIVE

## 2021-01-24 NOTE — H&P (Signed)
ORTHOPAEDIC HISTORY & PHYSICAL Michelene Gardener, [place] - 01/23/2021 9:30 AM EDT Formatting of this note is different from the original. [facility] Chief Complaint:   Chief Complaint  Patient presents with   Knee Pain  H & P RIGHT KNEE   History of Present Illness:   Gina Matthews is a 70 y.o. female that presents to clinic today for her preoperative history and evaluation. Patient presents unaccompanied. The patient is scheduled to undergo a right total knee arthroplasty on 01/27/21 by Dr. Ernest Pine. Her pain began several years ago. The pain is located primarily along the anterior aspect of the knee. She describes her pain as worse with stairs, as well as rising after sitting and squatting. She reports associated swelling with some giving way of the knee. She denies associated numbness or tingling, denies locking.   The patient's symptoms have progressed to the point that they decrease her quality of life. The patient has previously undergone conservative treatment including NSAIDS and injections to the knee without adequate control of her symptoms.  Patient states she can have a friend stay with her following surgery. Also states that her children have expressed a strong preference for patient to go to rehab facility following surgery for 1 week.  Denies significant cardiac history, history of blood clots, or lumbar surgery.   Past Medical, Surgical, Family, Social History, Allergies, Medications:   Past Medical History:  Past Medical History:  Diagnosis Date   Arthritis   Chronic pain of right knee 07/30/2017   Fibromyalgia   Gastroesophageal reflux disease with hiatal hernia 10/03/2020   Hypertension   Scoliosis   Past Surgical History:  Past Surgical History:  Procedure Laterality Date   CESAREAN SECTION 1980, 1982, 1984, 1986  x 4   foreign object extracted from stomach  swallowed an arrow as a young child   HERNIA REPAIR Left   inguinal   RHINOPLASTY  age 57 fracture repair   right foot surgery  age 52; open reduction of fracture   Current Medications:  Current Outpatient Medications  Medication Sig Dispense Refill   acetaminophen (TYLENOL) 500 MG tablet Take 1,000 mg by mouth every 8 (eight) hours as needed for Pain   alendronate (FOSAMAX) 70 MG tablet Take 70 mg by mouth every 7 (seven) days   amLODIPine (NORVASC) 5 MG tablet Take 1 tablet (5 mg total) by mouth once daily 90 tablet 3   b complex vitamins capsule Take 1 capsule by mouth once daily   biotin 10 mg Tab Take 1 tablet by mouth once daily   CALCIUM-VITAMIN D3 ORAL Take 1 caplet by mouth once daily   carvediloL (COREG) 6.25 MG tablet Take 1 tablet (6.25 mg total) by mouth 2 (two) times daily with meals 180 tablet 2   celecoxib (CELEBREX) 200 MG capsule Take 1 capsule by mouth twice daily 60 capsule 10   cetirizine (ZYRTEC) 10 mg capsule Take 10 mg by mouth once daily   cholecalciferol (VITAMIN D3) 1000 unit tablet Take 1,000 Units by mouth once daily   DULoxetine (CYMBALTA) 60 MG DR capsule Take 1 capsule (60 mg total) by mouth once daily 90 capsule 2   famotidine (PEPCID) 20 MG tablet Take 20 mg by mouth 2 (two) times daily as needed   ferrous sulfate 142 mg (45 mg iron) TbER Take 1 tablet by mouth once daily   gabapentin (NEURONTIN) 600 MG tablet 1 tab in AM, 1 at noon, 2 tabs PO  in PM 120 tablet 6   geriatric multivitamin-min tablet Take 1 tablet by mouth once daily   ibuprofen (MOTRIN) 200 MG tablet Take 600 mg by mouth 2 (two) times daily   lisinopriL (ZESTRIL) 40 MG tablet Take 1 tablet (40 mg total) by mouth once daily 90 tablet 2   traMADoL (ULTRAM) 50 mg tablet Take 1 tablet (50 mg total) by mouth Every 4 (four) hours for 304 days 180 tablet 0   traZODone (DESYREL) 50 MG tablet Take 1 tablet (50 mg total) by mouth nightly 90 tablet 2   cyanocobalamin, vitamin B-12, 1,000 mcg/mL Drop Place 1 drop under the tongue once daily   traMADoL  (ULTRAM) 50 mg tablet Take 1 tablet (50 mg total) by mouth Every 4 (four) hours for 304 days 180 tablet 0   traMADoL (ULTRAM) 50 mg tablet Take 1 tablet (50 mg total) by mouth Every 4 (four) hours for 304 days 180 tablet 0   No current facility-administered medications for this visit.   Allergies: No Known Allergies  Social History:  Social History   Socioeconomic History   Marital status: Widowed   Number of children: 4   Years of education: 14   Highest education level: Associate degree: occupational, Scientist, product/process development, or vocational program  Occupational History   Occupation: Retired- Charity fundraiser  Tobacco Use   Smoking status: Never Smoker   Smokeless tobacco: Never Used  Building services engineer Use: Never used  Substance and Sexual Activity   Alcohol use: No  Alcohol/week: 0.0 standard drinks   Drug use: No   Sexual activity: Defer  Partners: Male  Social History Narrative  Married, 4 children; 9 grandchildren  Used to live in [place], [place]  Cares for husband with Alzheimer's  LPN in outpt peds; formerly long-term care  Denies tobacco/ETOH/drug use   06/04/2020  Military Service: None  Likes/Enjoys/What fills your day?: Narrative  Home: One Story with bonus room  Your Bedrooms is on: First Level  Fewest Steps to enter the home: 5 with railings  Other persons in the home: Live alone  Pets: none  Medical equipment you use daily: None  Medical equipment available in the home: None  Dental: significant dental problems. Last screening Date: February 2022 Screening is: Up to date  Vision: Wears Reading glasses and Prescription Glasses. Last screening Date: March 2020 Screening is: Up to date  Hearing: Ringing in her ears. Last screening Date: March 2021 Screening is: Up to date  Dermatology: Denies any areas of concern on skin. Last screening Date: March 2020 Screening is: Up to date    Family History:  Family History  Problem Relation Age of Onset   Anxiety Mother   Depression Mother    Diabetes type II Mother   Hyperlipidemia (Elevated cholesterol) Mother   High blood pressure (Hypertension) Mother   Osteoarthritis Mother   Thyroid disease Mother   Coronary Artery Disease (Blocked arteries around heart) Father   Review of Systems:   A 10+ ROS was performed, reviewed, and the pertinent orthopaedic findings are documented in the HPI.   Physical Examination:   BP 124/70 (BP Location: Left upper arm, Patient Position: Sitting, BP Cuff Size: Adult)  Ht 165.1 cm (5\' 5" )  Wt 66.5 kg (146 lb 9.6 oz)  BMI 24.40 kg/m   Patient is a well-developed, well-nourished female in no acute distress. Patient has normal mood and affect. Patient is alert and oriented to person, place, and time.   HEENT: Atraumatic, normocephalic. Pupils  equal and reactive to light. Extraocular motion intact. Noninjected sclera.  Cardiovascular: Regular rate and rhythm, with no murmurs, rubs, or gallops. Distal pulses palpable. No carotid bruits.  Respiratory: Lungs clear to auscultation bilaterally.   Right Knee: Soft tissue swelling: mild Effusion: none Erythema: none Crepitance: moderate Tenderness: anterior Alignment: relative valgus Mediolateral laxity: stable Posterior sag: negative Patellar tracking: Good tracking without evidence of subluxation or tilt Atrophy: No significant atrophy.  Quadriceps tone was fair to good. Range of motion: 0/2/121 degrees  Sensation intact over the saphenous, lateral sural cutaneous, superficial fibular, and deep fibular nerve distributions.  Tests Performed/Reviewed:  X-rays  Anteroposterior, lateral, and sunrise views of the right knee were obtained. Images reveal severe loss of lateral compartment joint space with near bone-on-bone contact and significant osteophyte formation. Subchondral sclerosis is noted. Complete loss of patellofemoral joint space with erosion of the lateral trochlea also noted.  Impression:   ICD-10-CM  1. Primary  osteoarthritis of right knee M17.11   Plan:   The patient has end-stage degenerative changes of the right knee. It was explained to the patient that the condition is progressive in nature. Having failed conservative treatment, the patient has elected to proceed with a total joint arthroplasty. The patient will undergo a total joint arthroplasty with Dr. Ernest Pine. The risks of surgery, including blood clot and infection, were discussed with the patient. Measures to reduce these risks, including the use of anticoagulation, perioperative antibiotics, and early ambulation were discussed. The importance of postoperative physical therapy was discussed with the patient. The patient elects to proceed with surgery. The patient is instructed to stop all blood thinners prior to surgery. The patient is instructed to call the hospital the day before surgery to learn of the proper arrival time.   I did speak briefly with patient's son at the end of the appointment. I explained that final decision for going to rehab facility would be based on patient's progress with physical therapy in the hospital following surgery, but that as a rule we find that patients do better when they go home as long as there is adequate support.  Contact our office with any questions or concerns. Follow up as indicated, or sooner should any new problems arise, if conditions worsen, or if they are otherwise concerned.   Michelene Gardener, PA -C [facility] [address] [place], [place] [address] Phone: [phone]  This note was generated in part with voice recognition software and I apologize for any typographical errors that were not detected and corrected.  Electronically signed by Michelene Gardener, PA at 01/23/2021 5:10 PM EDT

## 2021-01-27 ENCOUNTER — Other Ambulatory Visit: Payer: Self-pay

## 2021-01-27 ENCOUNTER — Observation Stay
Admission: RE | Admit: 2021-01-27 | Discharge: 2021-01-29 | Disposition: A | Discharge status: Home / Self Care | Payer: Medicare Other | Source: Ambulatory Visit | Attending: Orthopedic Surgery | Admitting: Orthopedic Surgery

## 2021-01-27 ENCOUNTER — Ambulatory Visit: Payer: Medicare Other | Admitting: Urgent Care

## 2021-01-27 ENCOUNTER — Observation Stay: Payer: Medicare Other

## 2021-01-27 ENCOUNTER — Ambulatory Visit: Payer: Medicare Other | Admitting: Certified Registered Nurse Anesthetist

## 2021-01-27 ENCOUNTER — Encounter
Admission: RE | Disposition: A | Discharge status: Home / Self Care | Payer: Self-pay | Source: Ambulatory Visit | Attending: Orthopedic Surgery

## 2021-01-27 ENCOUNTER — Encounter: Payer: Self-pay | Admitting: Orthopedic Surgery

## 2021-01-27 DIAGNOSIS — Z96659 Presence of unspecified artificial knee joint: Secondary | ICD-10-CM

## 2021-01-27 DIAGNOSIS — Z79899 Other long term (current) drug therapy: Secondary | ICD-10-CM | POA: Insufficient documentation

## 2021-01-27 DIAGNOSIS — I10 Essential (primary) hypertension: Secondary | ICD-10-CM | POA: Diagnosis not present

## 2021-01-27 DIAGNOSIS — M1711 Unilateral primary osteoarthritis, right knee: Secondary | ICD-10-CM | POA: Diagnosis not present

## 2021-01-27 DIAGNOSIS — Z96651 Presence of right artificial knee joint: Secondary | ICD-10-CM

## 2021-01-27 DIAGNOSIS — M797 Fibromyalgia: Secondary | ICD-10-CM

## 2021-01-27 DIAGNOSIS — Z791 Long term (current) use of non-steroidal anti-inflammatories (NSAID): Secondary | ICD-10-CM

## 2021-01-27 HISTORY — PX: KNEE ARTHROPLASTY: SHX992

## 2021-01-27 LAB — ABO/RH: ABO/RH(D): B POS

## 2021-01-27 SURGERY — ARTHROPLASTY, KNEE, TOTAL, USING IMAGELESS COMPUTER-ASSISTED NAVIGATION
Anesthesia: Spinal | Site: Knee | Laterality: Right

## 2021-01-27 MED ORDER — PHENYLEPHRINE HCL (PRESSORS) 10 MG/ML IV SOLN
INTRAVENOUS | Status: DC | PRN
  Administered 2021-01-27 (×3): 100 ug via INTRAVENOUS

## 2021-01-27 MED ORDER — CELECOXIB 200 MG PO CAPS: 400.0000 mg | ORAL_CAPSULE | Freq: Once | ORAL | Status: AC

## 2021-01-27 MED ORDER — ALUM & MAG HYDROXIDE-SIMETH 200-200-20 MG/5ML PO SUSP
30.0000 mL | Freq: Once | ORAL | Status: AC
  Administered 2021-01-27: 30 mL via ORAL
  Filled 2021-01-27: qty 30

## 2021-01-27 MED ORDER — CARVEDILOL 12.5 MG PO TABS
ORAL_TABLET | ORAL | Status: AC
  Administered 2021-01-27: 6.25 mg via ORAL
  Filled 2021-01-27: qty 1

## 2021-01-27 MED ORDER — FENTANYL CITRATE (PF) 100 MCG/2ML IJ SOLN
INTRAMUSCULAR | Status: AC
  Filled 2021-01-27: qty 2

## 2021-01-27 MED ORDER — PHENYLEPHRINE HCL (PRESSORS) 10 MG/ML IV SOLN
INTRAVENOUS | Status: AC
  Filled 2021-01-27: qty 1

## 2021-01-27 MED ORDER — PROPOFOL 10 MG/ML IV BOLUS
INTRAVENOUS | Status: DC | PRN
  Administered 2021-01-27: 50 mg via INTRAVENOUS
  Administered 2021-01-27: 30 mg via INTRAVENOUS

## 2021-01-27 MED ORDER — DEXAMETHASONE SODIUM PHOSPHATE 10 MG/ML IJ SOLN: 8.0000 mg | Freq: Once | INTRAMUSCULAR | Status: DC

## 2021-01-27 MED ORDER — ACETAMINOPHEN 10 MG/ML IV SOLN
INTRAVENOUS | Status: DC | PRN
  Administered 2021-01-27: 1000 mg via INTRAVENOUS

## 2021-01-27 MED ORDER — GABAPENTIN 600 MG PO TABS
600.0000 mg | ORAL_TABLET | Freq: Two times a day (BID) | ORAL | Status: DC
  Administered 2021-01-28 – 2021-01-29 (×4): 600 mg via ORAL
  Filled 2021-01-27 (×4): qty 1

## 2021-01-27 MED ORDER — LORATADINE 10 MG PO TABS
10.0000 mg | ORAL_TABLET | Freq: Every day | ORAL | Status: DC
  Administered 2021-01-28 – 2021-01-29 (×2): 10 mg via ORAL
  Filled 2021-01-27 (×3): qty 1

## 2021-01-27 MED ORDER — FAMOTIDINE 20 MG PO TABS
ORAL_TABLET | ORAL | Status: AC
  Administered 2021-01-27: 20 mg via ORAL
  Filled 2021-01-27: qty 1

## 2021-01-27 MED ORDER — TRAMADOL HCL 50 MG PO TABS
50.0000 mg | ORAL_TABLET | ORAL | Status: DC | PRN
  Administered 2021-01-28: 50 mg via ORAL

## 2021-01-27 MED ORDER — PANTOPRAZOLE SODIUM 40 MG PO TBEC
40.0000 mg | DELAYED_RELEASE_TABLET | Freq: Two times a day (BID) | ORAL | Status: DC
  Administered 2021-01-27 – 2021-01-29 (×4): 40 mg via ORAL
  Filled 2021-01-27 (×6): qty 1

## 2021-01-27 MED ORDER — OXYCODONE HCL 5 MG PO TABS
10.0000 mg | ORAL_TABLET | ORAL | Status: DC | PRN
  Administered 2021-01-28 (×2): 10 mg via ORAL

## 2021-01-27 MED ORDER — CELECOXIB 200 MG PO CAPS
ORAL_CAPSULE | ORAL | Status: AC
  Administered 2021-01-27: 200 mg via ORAL
  Filled 2021-01-27: qty 1

## 2021-01-27 MED ORDER — MIDAZOLAM HCL 5 MG/5ML IJ SOLN
INTRAMUSCULAR | Status: DC | PRN
  Administered 2021-01-27: 2 mg via INTRAVENOUS

## 2021-01-27 MED ORDER — MENTHOL 3 MG MT LOZG
1.0000 | LOZENGE | OROMUCOSAL | Status: DC | PRN
  Filled 2021-01-27: qty 9

## 2021-01-27 MED ORDER — SODIUM CHLORIDE 0.9 % IV SOLN
INTRAVENOUS | Status: DC | PRN
  Administered 2021-01-27: 60 mL

## 2021-01-27 MED ORDER — OXYCODONE HCL 5 MG PO TABS
ORAL_TABLET | ORAL | Status: AC
  Administered 2021-01-27: 10 mg via ORAL
  Filled 2021-01-27: qty 2

## 2021-01-27 MED ORDER — LISINOPRIL 20 MG PO TABS
40.0000 mg | ORAL_TABLET | Freq: Every day | ORAL | Status: DC
  Administered 2021-01-28 – 2021-01-29 (×2): 40 mg via ORAL
  Filled 2021-01-27 (×3): qty 2

## 2021-01-27 MED ORDER — LACTATED RINGERS IV SOLN: INTRAVENOUS | Status: DC

## 2021-01-27 MED ORDER — CELECOXIB 200 MG PO CAPS
ORAL_CAPSULE | ORAL | Status: AC
  Administered 2021-01-27: 400 mg via ORAL
  Filled 2021-01-27: qty 2

## 2021-01-27 MED ORDER — DIPHENHYDRAMINE HCL 12.5 MG/5ML PO ELIX
12.5000 mg | ORAL_SOLUTION | ORAL | Status: DC | PRN
  Filled 2021-01-27: qty 10

## 2021-01-27 MED ORDER — TRAZODONE HCL 50 MG PO TABS
50.0000 mg | ORAL_TABLET | Freq: Every day | ORAL | Status: DC
  Administered 2021-01-27 – 2021-01-28 (×2): 50 mg via ORAL
  Filled 2021-01-27 (×3): qty 1

## 2021-01-27 MED ORDER — ONDANSETRON HCL 4 MG/2ML IJ SOLN: 4.0000 mg | Freq: Once | INTRAMUSCULAR | Status: DC | PRN

## 2021-01-27 MED ORDER — ACETAMINOPHEN 10 MG/ML IV SOLN
1000.0000 mg | Freq: Four times a day (QID) | INTRAVENOUS | Status: AC
  Administered 2021-01-28: 1000 mg via INTRAVENOUS
  Filled 2021-01-27: qty 100

## 2021-01-27 MED ORDER — PRONTOSAN WOUND IRRIGATION OPTIME
TOPICAL | Status: DC | PRN
  Administered 2021-01-27: 1

## 2021-01-27 MED ORDER — NEOMYCIN-POLYMYXIN B GU 40-200000 IR SOLN
Status: DC | PRN
  Administered 2021-01-27: 14 mL

## 2021-01-27 MED ORDER — BISACODYL 10 MG RE SUPP
10.0000 mg | Freq: Every day | RECTAL | Status: DC | PRN
  Filled 2021-01-27: qty 1

## 2021-01-27 MED ORDER — OXYCODONE HCL 5 MG PO TABS: 5.0000 mg | ORAL_TABLET | ORAL | Status: DC | PRN

## 2021-01-27 MED ORDER — FERROUS SULFATE 325 (65 FE) MG PO TABS
325.0000 mg | ORAL_TABLET | Freq: Two times a day (BID) | ORAL | Status: DC
  Administered 2021-01-28 – 2021-01-29 (×3): 325 mg via ORAL
  Filled 2021-01-27 (×4): qty 1

## 2021-01-27 MED ORDER — TRANEXAMIC ACID-NACL 1000-0.7 MG/100ML-% IV SOLN
1000.0000 mg | INTRAVENOUS | Status: AC
  Administered 2021-01-27: 1000 mg via INTRAVENOUS

## 2021-01-27 MED ORDER — METOCLOPRAMIDE HCL 10 MG PO TABS
10.0000 mg | ORAL_TABLET | Freq: Three times a day (TID) | ORAL | Status: DC
  Administered 2021-01-28: 10 mg via ORAL

## 2021-01-27 MED ORDER — VITAMIN D3 25 MCG (1000 UNIT) PO TABS
1000.0000 [IU] | ORAL_TABLET | Freq: Every day | ORAL | Status: DC
  Administered 2021-01-28 – 2021-01-29 (×2): 1000 [IU] via ORAL
  Filled 2021-01-27 (×3): qty 1

## 2021-01-27 MED ORDER — TRANEXAMIC ACID-NACL 1000-0.7 MG/100ML-% IV SOLN: 1000.0000 mg | Freq: Once | INTRAVENOUS | Status: AC

## 2021-01-27 MED ORDER — 0.9 % SODIUM CHLORIDE (POUR BTL) OPTIME
TOPICAL | Status: DC | PRN
  Administered 2021-01-27: 500 mL

## 2021-01-27 MED ORDER — FLEET ENEMA 7-19 GM/118ML RE ENEM: 1.0000 | ENEMA | Freq: Once | RECTAL | Status: DC | PRN

## 2021-01-27 MED ORDER — ACETAMINOPHEN 10 MG/ML IV SOLN
INTRAVENOUS | Status: AC
  Filled 2021-01-27: qty 100

## 2021-01-27 MED ORDER — MIDAZOLAM HCL 2 MG/2ML IJ SOLN
INTRAMUSCULAR | Status: AC
  Filled 2021-01-27: qty 2

## 2021-01-27 MED ORDER — CEFAZOLIN SODIUM-DEXTROSE 2-4 GM/100ML-% IV SOLN
2.0000 g | INTRAVENOUS | Status: AC
  Administered 2021-01-27: 2 g via INTRAVENOUS

## 2021-01-27 MED ORDER — OXYCODONE HCL 5 MG/5ML PO SOLN: 5.0000 mg | Freq: Once | ORAL | Status: DC | PRN

## 2021-01-27 MED ORDER — PROPOFOL 10 MG/ML IV BOLUS
INTRAVENOUS | Status: AC
  Filled 2021-01-27: qty 20

## 2021-01-27 MED ORDER — OXYCODONE HCL 5 MG PO TABS: 5.0000 mg | ORAL_TABLET | Freq: Once | ORAL | Status: DC | PRN

## 2021-01-27 MED ORDER — FAMOTIDINE 20 MG PO TABS: 20.0000 mg | ORAL_TABLET | Freq: Once | ORAL | Status: AC

## 2021-01-27 MED ORDER — LIDOCAINE HCL (CARDIAC) PF 100 MG/5ML IV SOSY
PREFILLED_SYRINGE | INTRAVENOUS | Status: DC | PRN
  Administered 2021-01-27: 60 mg via INTRAVENOUS

## 2021-01-27 MED ORDER — TRANEXAMIC ACID-NACL 1000-0.7 MG/100ML-% IV SOLN
INTRAVENOUS | Status: AC
  Filled 2021-01-27: qty 100

## 2021-01-27 MED ORDER — DULOXETINE HCL 60 MG PO CPEP
60.0000 mg | ORAL_CAPSULE | Freq: Every day | ORAL | Status: DC
  Administered 2021-01-28 – 2021-01-29 (×2): 60 mg via ORAL
  Filled 2021-01-27 (×3): qty 1

## 2021-01-27 MED ORDER — DEXAMETHASONE SODIUM PHOSPHATE 10 MG/ML IJ SOLN
INTRAMUSCULAR | Status: AC
  Filled 2021-01-27: qty 1

## 2021-01-27 MED ORDER — AMLODIPINE BESYLATE 5 MG PO TABS
5.0000 mg | ORAL_TABLET | Freq: Every day | ORAL | Status: DC
  Administered 2021-01-28 – 2021-01-29 (×2): 5 mg via ORAL
  Filled 2021-01-27 (×3): qty 1

## 2021-01-27 MED ORDER — TRANEXAMIC ACID-NACL 1000-0.7 MG/100ML-% IV SOLN
INTRAVENOUS | Status: AC
  Administered 2021-01-27: 1000 mg via INTRAVENOUS
  Filled 2021-01-27: qty 100

## 2021-01-27 MED ORDER — CARVEDILOL 12.5 MG PO TABS: 6.2500 mg | ORAL_TABLET | Freq: Two times a day (BID) | ORAL | Status: DC

## 2021-01-27 MED ORDER — PHENYLEPHRINE HCL-NACL 20-0.9 MG/250ML-% IV SOLN
INTRAVENOUS | Status: DC | PRN
  Administered 2021-01-27: 35 ug/min via INTRAVENOUS

## 2021-01-27 MED ORDER — CEFAZOLIN SODIUM-DEXTROSE 2-4 GM/100ML-% IV SOLN
INTRAVENOUS | Status: AC
  Filled 2021-01-27: qty 100

## 2021-01-27 MED ORDER — GABAPENTIN 300 MG PO CAPS
ORAL_CAPSULE | ORAL | Status: AC
  Administered 2021-01-27: 300 mg via ORAL
  Filled 2021-01-27: qty 1

## 2021-01-27 MED ORDER — FENTANYL CITRATE (PF) 100 MCG/2ML IJ SOLN
INTRAMUSCULAR | Status: DC | PRN
  Administered 2021-01-27 (×2): 50 ug via INTRAVENOUS

## 2021-01-27 MED ORDER — PROPOFOL 500 MG/50ML IV EMUL
INTRAVENOUS | Status: DC | PRN
  Administered 2021-01-27: 75 ug/kg/min via INTRAVENOUS

## 2021-01-27 MED ORDER — ONDANSETRON HCL 4 MG PO TABS: 4.0000 mg | ORAL_TABLET | Freq: Four times a day (QID) | ORAL | Status: DC | PRN

## 2021-01-27 MED ORDER — ENSURE PRE-SURGERY PO LIQD
296.0000 mL | Freq: Once | ORAL | Status: DC
  Filled 2021-01-27: qty 296

## 2021-01-27 MED ORDER — ADULT MULTIVITAMIN W/MINERALS CH
1.0000 | ORAL_TABLET | Freq: Every day | ORAL | Status: DC
Start: 2021-01-28 — End: 2021-01-29
  Administered 2021-01-28 – 2021-01-29 (×2): 1 via ORAL
  Filled 2021-01-27 (×3): qty 1

## 2021-01-27 MED ORDER — LIDOCAINE HCL (PF) 1 % IJ SOLN
INTRAMUSCULAR | Status: DC | PRN
  Administered 2021-01-27 (×3): 1 mL

## 2021-01-27 MED ORDER — DEXMEDETOMIDINE (PRECEDEX) IN NS 20 MCG/5ML (4 MCG/ML) IV SYRINGE
PREFILLED_SYRINGE | INTRAVENOUS | Status: DC | PRN
  Administered 2021-01-27: 4 ug via INTRAVENOUS

## 2021-01-27 MED ORDER — BUPIVACAINE HCL (PF) 0.25 % IJ SOLN
INTRAMUSCULAR | Status: DC | PRN
  Administered 2021-01-27: 60 mL

## 2021-01-27 MED ORDER — CHLORHEXIDINE GLUCONATE 0.12 % MT SOLN: 15.0000 mL | Freq: Once | OROMUCOSAL | Status: AC

## 2021-01-27 MED ORDER — FENTANYL CITRATE (PF) 100 MCG/2ML IJ SOLN: 25.0000 ug | INTRAMUSCULAR | Status: DC | PRN

## 2021-01-27 MED ORDER — SENNOSIDES-DOCUSATE SODIUM 8.6-50 MG PO TABS
1.0000 | ORAL_TABLET | Freq: Two times a day (BID) | ORAL | Status: DC
  Administered 2021-01-27 – 2021-01-29 (×4): 1 via ORAL
  Filled 2021-01-27 (×6): qty 1

## 2021-01-27 MED ORDER — ENOXAPARIN SODIUM 30 MG/0.3ML IJ SOSY
30.0000 mg | PREFILLED_SYRINGE | Freq: Two times a day (BID) | INTRAMUSCULAR | Status: DC
  Administered 2021-01-28 – 2021-01-29 (×3): 30 mg via SUBCUTANEOUS
  Filled 2021-01-27 (×5): qty 0.3

## 2021-01-27 MED ORDER — ACETAMINOPHEN 325 MG PO TABS: 325.0000 mg | ORAL_TABLET | Freq: Four times a day (QID) | ORAL | Status: DC | PRN

## 2021-01-27 MED ORDER — GABAPENTIN 600 MG PO TABS
1200.0000 mg | ORAL_TABLET | Freq: Every day | ORAL | Status: DC
  Administered 2021-01-27 – 2021-01-28 (×2): 1200 mg via ORAL
  Filled 2021-01-27 (×3): qty 2

## 2021-01-27 MED ORDER — ONDANSETRON HCL 4 MG/2ML IJ SOLN: 4.0000 mg | Freq: Four times a day (QID) | INTRAMUSCULAR | Status: DC | PRN

## 2021-01-27 MED ORDER — CELECOXIB 200 MG PO CAPS: 200.0000 mg | ORAL_CAPSULE | Freq: Two times a day (BID) | ORAL | Status: DC

## 2021-01-27 MED ORDER — SODIUM CHLORIDE 0.9 % IR SOLN
Status: DC | PRN
  Administered 2021-01-27: 3000 mL

## 2021-01-27 MED ORDER — MAGNESIUM HYDROXIDE 400 MG/5ML PO SUSP: 30.0000 mL | Freq: Every day | ORAL | Status: DC

## 2021-01-27 MED ORDER — PHENOL 1.4 % MT LIQD
1.0000 | OROMUCOSAL | Status: DC | PRN
  Filled 2021-01-27: qty 177

## 2021-01-27 MED ORDER — BUPIVACAINE HCL (PF) 0.5 % IJ SOLN
INTRAMUSCULAR | Status: DC | PRN
  Administered 2021-01-27: 3 mL via INTRATHECAL

## 2021-01-27 MED ORDER — CEFAZOLIN SODIUM-DEXTROSE 2-4 GM/100ML-% IV SOLN: 2.0000 g | Freq: Four times a day (QID) | INTRAVENOUS | Status: AC

## 2021-01-27 MED ORDER — GABAPENTIN 300 MG PO CAPS: 300.0000 mg | ORAL_CAPSULE | Freq: Once | ORAL | Status: AC

## 2021-01-27 MED ORDER — CEFAZOLIN SODIUM-DEXTROSE 2-4 GM/100ML-% IV SOLN
INTRAVENOUS | Status: AC
  Administered 2021-01-27: 2 g via INTRAVENOUS
  Filled 2021-01-27: qty 100

## 2021-01-27 MED ORDER — ONDANSETRON HCL 4 MG/2ML IJ SOLN
INTRAMUSCULAR | Status: DC | PRN
  Administered 2021-01-27: 4 mg via INTRAVENOUS

## 2021-01-27 MED ORDER — PROPOFOL 10 MG/ML IV BOLUS
INTRAVENOUS | Status: AC
  Filled 2021-01-27: qty 40

## 2021-01-27 MED ORDER — ORAL CARE MOUTH RINSE: 15.0000 mL | Freq: Once | OROMUCOSAL | Status: AC

## 2021-01-27 MED ORDER — ALUM & MAG HYDROXIDE-SIMETH 200-200-20 MG/5ML PO SUSP
30.0000 mL | ORAL | Status: DC | PRN
  Administered 2021-01-28: 30 mL via ORAL

## 2021-01-27 MED ORDER — SODIUM CHLORIDE 0.9 % IV SOLN: INTRAVENOUS | Status: DC

## 2021-01-27 MED ORDER — HYDROMORPHONE HCL 1 MG/ML IJ SOLN: 0.5000 mg | INTRAMUSCULAR | Status: DC | PRN

## 2021-01-27 MED ORDER — ACETAMINOPHEN 10 MG/ML IV SOLN
INTRAVENOUS | Status: AC
  Administered 2021-01-27: 1000 mg via INTRAVENOUS
  Filled 2021-01-27: qty 100

## 2021-01-27 MED ORDER — PROPOFOL 1000 MG/100ML IV EMUL
INTRAVENOUS | Status: AC
  Filled 2021-01-27: qty 100

## 2021-01-27 MED ORDER — CHLORHEXIDINE GLUCONATE 0.12 % MT SOLN
OROMUCOSAL | Status: AC
  Administered 2021-01-27: 15 mL via OROMUCOSAL
  Filled 2021-01-27: qty 15

## 2021-01-27 SURGICAL SUPPLY — 83 items
ATTUNE MED DOME PAT 32 KNEE (Knees) ×1 IMPLANT
ATTUNE MED DOME PAT 32MM KNEE (Knees) ×1 IMPLANT
ATTUNE PSFEM RTSZ5 NARCEM KNEE (Femur) ×2 IMPLANT
ATTUNE PSRP INSR SZ5 8 KNEE (Insert) ×1 IMPLANT
ATTUNE PSRP INSR SZ5 8MM KNEE (Insert) ×1 IMPLANT
BASE TIBIAL ROT PLAT SZ 5 KNEE (Knees) IMPLANT
BATTERY INSTRU NAVIGATION (MISCELLANEOUS) ×12 IMPLANT
BLADE SAW 70X12.5 (BLADE) ×3 IMPLANT
BLADE SAW 90X13X1.19 OSCILLAT (BLADE) ×3 IMPLANT
BLADE SAW 90X25X1.19 OSCILLAT (BLADE) ×3 IMPLANT
BONE CEMENT GENTAMICIN (Cement) ×6 IMPLANT
CEMENT BONE GENTAMICIN 40 (Cement) IMPLANT
COOLER POLAR GLACIER W/PUMP (MISCELLANEOUS) ×3 IMPLANT
CUFF TOURN SGL QUICK 24 (TOURNIQUET CUFF)
CUFF TOURN SGL QUICK 34 (TOURNIQUET CUFF)
CUFF TRNQT CYL 24X4X16.5-23 (TOURNIQUET CUFF) IMPLANT
CUFF TRNQT CYL 34X4.125X (TOURNIQUET CUFF) IMPLANT
DRAPE 3/4 80X56 (DRAPES) ×3 IMPLANT
DRAPE INCISE IOBAN 66X45 STRL (DRAPES) ×3 IMPLANT
DRSG DERMACEA 8X12 NADH (GAUZE/BANDAGES/DRESSINGS) ×3 IMPLANT
DRSG MEPILEX SACRM 8.7X9.8 (GAUZE/BANDAGES/DRESSINGS) ×3 IMPLANT
DRSG OPSITE POSTOP 4X14 (GAUZE/BANDAGES/DRESSINGS) ×3 IMPLANT
DRSG TEGADERM 4X4.75 (GAUZE/BANDAGES/DRESSINGS) ×3 IMPLANT
DURAPREP 26ML APPLICATOR (WOUND CARE) ×6 IMPLANT
ELECT CAUTERY BLADE 6.4 (BLADE) ×3 IMPLANT
ELECT REM PT RETURN 9FT ADLT (ELECTROSURGICAL) ×3
ELECTRODE REM PT RTRN 9FT ADLT (ELECTROSURGICAL) ×1 IMPLANT
EX-PIN ORTHOLOCK NAV 4X150 (PIN) ×6 IMPLANT
GLOVE SRG 8 PF TXTR STRL LF DI (GLOVE) ×1 IMPLANT
GLOVE SURG ENC TEXT LTX SZ7.5 (GLOVE) ×6 IMPLANT
GLOVE SURG UNDER POLY LF SZ7.5 (GLOVE) ×3 IMPLANT
GLOVE SURG UNDER POLY LF SZ8 (GLOVE) ×2
GOWN STRL REUS W/ TWL LRG LVL3 (GOWN DISPOSABLE) ×2 IMPLANT
GOWN STRL REUS W/ TWL XL LVL3 (GOWN DISPOSABLE) ×1 IMPLANT
GOWN STRL REUS W/TWL LRG LVL3 (GOWN DISPOSABLE) ×4
GOWN STRL REUS W/TWL XL LVL3 (GOWN DISPOSABLE) ×2
HEMOVAC 400CC 10FR (MISCELLANEOUS) ×3 IMPLANT
HOLDER FOLEY CATH W/STRAP (MISCELLANEOUS) ×3 IMPLANT
IRRIGATION SURGIPHOR STRL (IV SOLUTION) ×3 IMPLANT
IV NS IRRIG 3000ML ARTHROMATIC (IV SOLUTION) ×3 IMPLANT
KIT TURNOVER KIT A (KITS) ×3 IMPLANT
KNIFE SCULPS 14X20 (INSTRUMENTS) ×3 IMPLANT
LABEL OR SOLS (LABEL) ×3 IMPLANT
MANIFOLD NEPTUNE II (INSTRUMENTS) ×6 IMPLANT
NDL SAFETY ECLIPSE 18X1.5 (NEEDLE) ×1 IMPLANT
NDL SPNL 20GX3.5 QUINCKE YW (NEEDLE) ×2 IMPLANT
NEEDLE HYPO 18GX1.5 SHARP (NEEDLE) ×2
NEEDLE SPNL 20GX3.5 QUINCKE YW (NEEDLE) ×6 IMPLANT
NS IRRIG 500ML POUR BTL (IV SOLUTION) ×3 IMPLANT
PACK TOTAL KNEE (MISCELLANEOUS) ×3 IMPLANT
PAD ABD DERMACEA PRESS 5X9 (GAUZE/BANDAGES/DRESSINGS) ×4 IMPLANT
PAD CAST CTTN 4X4 STRL (SOFTGOODS) IMPLANT
PAD WRAPON POLAR KNEE (MISCELLANEOUS) ×1 IMPLANT
PADDING CAST COTTON 4X4 STRL (SOFTGOODS) ×2
PENCIL SMOKE EVACUATOR COATED (MISCELLANEOUS) ×3 IMPLANT
PIN DRILL FIX HALF THREAD (BIT) ×6 IMPLANT
PIN DRILL QUICK PACK ×4 IMPLANT
PIN FIXATION 1/8DIA X 3INL (PIN) ×3 IMPLANT
PULSAVAC PLUS IRRIG FAN TIP (DISPOSABLE) ×3
SOL PREP PVP 2OZ (MISCELLANEOUS) ×3
SOLUTION PREP PVP 2OZ (MISCELLANEOUS) ×1 IMPLANT
SPONGE DRAIN TRACH 4X4 STRL 2S (GAUZE/BANDAGES/DRESSINGS) ×3 IMPLANT
SPONGE T-LAP 18X18 ~~LOC~~+RFID (SPONGE) ×9 IMPLANT
STAPLER SKIN PROX 35W (STAPLE) ×3 IMPLANT
STOCKINETTE BIAS CUT 6 980064 (GAUZE/BANDAGES/DRESSINGS) ×2 IMPLANT
STOCKINETTE IMPERV 14X48 (MISCELLANEOUS) ×2 IMPLANT
STRAP TIBIA SHORT (MISCELLANEOUS) ×3 IMPLANT
SUCTION FRAZIER HANDLE 10FR (MISCELLANEOUS) ×2
SUCTION TUBE FRAZIER 10FR DISP (MISCELLANEOUS) ×1 IMPLANT
SUT VIC AB 0 CT1 36 (SUTURE) ×6 IMPLANT
SUT VIC AB 1 CT1 36 (SUTURE) ×6 IMPLANT
SUT VIC AB 2-0 CT2 27 (SUTURE) ×3 IMPLANT
SYR 20ML LL LF (SYRINGE) ×3 IMPLANT
SYR 30ML LL (SYRINGE) ×6 IMPLANT
TAPE PAPER 3X10 WHT MICROPORE (GAUZE/BANDAGES/DRESSINGS) ×2 IMPLANT
TIBIAL BASE ROT PLAT SZ 5 KNEE (Knees) ×3 IMPLANT
TIP FAN IRRIG PULSAVAC PLUS (DISPOSABLE) ×1 IMPLANT
TOWEL OR 17X26 4PK STRL BLUE (TOWEL DISPOSABLE) ×3 IMPLANT
TOWER CARTRIDGE SMART MIX (DISPOSABLE) ×3 IMPLANT
TRAY FOLEY MTR SLVR 16FR STAT (SET/KITS/TRAYS/PACK) ×3 IMPLANT
WATER STERILE IRR 1000ML POUR (IV SOLUTION) ×4 IMPLANT
WATER STERILE IRR 500ML POUR (IV SOLUTION) ×3 IMPLANT
WRAPON POLAR PAD KNEE (MISCELLANEOUS) ×3

## 2021-01-27 NOTE — Anesthesia Procedure Notes (Signed)
Spinal  Patient location during procedure: OR Start time: 01/27/2021 12:38 PM End time: 01/27/2021 12:58 PM Reason for block: surgical anesthesia Staffing Performed: anesthesiologist and resident/CRNA  Anesthesiologist: Ramsdell, Kathryn F, MD Resident/CRNA: Smith, Adam, CRNA Preanesthetic Checklist Completed: patient identified, IV checked, site marked, risks and benefits discussed, surgical consent, monitors and equipment checked, pre-op evaluation and timeout performed Spinal Block Patient position: sitting Prep: Betadine and ChloraPrep Patient monitoring: heart rate, cardiac monitor, continuous pulse ox and blood pressure Approach: midline Location: L4-5 Injection technique: single-shot Needle Needle type: Whitacre and Introducer  Needle gauge: 24 G Needle length: 9 cm Assessment Events: CSF return Additional Notes Negative paresthesia. Negative blood return. Positive free-flowing CSF. Expiration date of kit checked and confirmed. Patient tolerated procedure well, without complications. Difficult spinal due to scoliosis, required several attempts.        

## 2021-01-27 NOTE — H&P (Signed)
The patient has been re-examined, and the chart reviewed, and there have been no interval changes to the documented history and physical.    The risks, benefits, and alternatives have been discussed at length. The patient expressed understanding of the risks benefits and agreed with plans for surgical intervention.  [place]ans[place] Sigmon P. Mai Longnecker, Jr. M.D.    

## 2021-01-27 NOTE — Anesthesia Preprocedure Evaluation (Addendum)
Anesthesia Evaluation  Patient identified by MRN, date of birth, ID band Patient awake    Reviewed: Allergy & Precautions, H&P , NPO status , Patient's Chart, lab work & pertinent test results  History of Anesthesia Complications Negative for: history of anesthetic complications  Airway Mallampati: II  TM Distance: >3 FB Neck ROM: Full    Dental   Implants:   Pulmonary neg pulmonary ROS, neg sleep apnea, neg COPD,    Pulmonary exam normal        Cardiovascular hypertension, (-) angina(-) Past MI and (-) Cardiac Stents Normal cardiovascular exam(-) dysrhythmias      Neuro/Psych PSYCHIATRIC DISORDERS Depression Chronic pain negative neurological ROS     GI/Hepatic Neg liver ROS, hiatal hernia, GERD  ,  Endo/Other  negative endocrine ROS  Renal/GU negative Renal ROS  negative genitourinary   Musculoskeletal  (+) Arthritis , Fibromyalgia -  Abdominal   Peds  Hematology negative hematology ROS (+)   Anesthesia Other Findings Past Medical History: No date: Anemia No date: Arthritis No date: Fibromyalgia No date: History of hiatal hernia No date: Hypertension  Past Surgical History: No date: CESAREAN SECTION     Comment:  x 4 No date: COSMETIC SURGERY     Comment:  lip injury No date: FRACTURE SURGERY; Right     Comment:  foot ORIF No date: HERNIA REPAIR; Left     Comment:  inguinal No date: MOUTH SURGERY     Comment:  implants No date: TONSILLECTOMY     Reproductive/Obstetrics negative OB ROS                            Anesthesia Physical Anesthesia Plan  ASA: 2  Anesthesia Plan: Spinal   Post-op Pain Management:    Induction:   PONV Risk Score and Plan: Propofol infusion  Airway Management Planned: Natural Airway  Additional Equipment:   Intra-op Plan:   Post-operative Plan:   Informed Consent: I have reviewed the patients History and Physical, chart, labs and  discussed the procedure including the risks, benefits and alternatives for the proposed anesthesia with the patient or authorized representative who has indicated his/her understanding and acceptance.     Dental Advisory Given  Plan Discussed with: Anesthesiologist, CRNA and Surgeon  Anesthesia Plan Comments:        Anesthesia Quick Evaluation

## 2021-01-27 NOTE — Transfer of Care (Signed)
Immediate Anesthesia Transfer of Care Note  Patient: Gina Matthews  Procedure(s) Performed: COMPUTER ASSISTED TOTAL KNEE ARTHROPLASTY (Right: Knee)  Patient Location: PACU  Anesthesia Type:Spinal  Level of Consciousness: awake, alert  and oriented  Airway & Oxygen Therapy: Patient Spontanous Breathing  Post-op Assessment: Report given to RN and Post -op Vital signs reviewed and stable  Post vital signs: Reviewed and stable  Last Vitals:  Vitals Value Taken Time  BP 112/78 01/27/21 1647  Temp    Pulse 86 01/27/21 1650  Resp 23 01/27/21 1650  SpO2 96 % 01/27/21 1650  Vitals shown include unvalidated device data.  Last Pain:  Vitals:   01/27/21 1154  TempSrc: Temporal  PainSc: 0-No pain         Complications: No notable events documented.

## 2021-01-27 NOTE — Anesthesia Postprocedure Evaluation (Signed)
Anesthesia Post Note  Patient: Konni Kesinger  Procedure(s) Performed: COMPUTER ASSISTED TOTAL KNEE ARTHROPLASTY (Right: Knee)  Patient location during evaluation: PACU Anesthesia Type: Combined General/Spinal Level of consciousness: awake and alert Pain management: pain level controlled Vital Signs Assessment: post-procedure vital signs reviewed and stable Respiratory status: spontaneous breathing, nonlabored ventilation and respiratory function stable Cardiovascular status: blood pressure returned to baseline and stable Postop Assessment: no apparent nausea or vomiting and spinal receding Anesthetic complications: no   No notable events documented.   Last Vitals:  Vitals:   01/27/21 1825 01/27/21 2000  BP: 113/75 119/73  Pulse: 72 81  Resp: 18 18  Temp: (!) 36.3 C (!) 36.1 C  SpO2: 99% 99%    Last Pain:  Vitals:   01/27/21 2000  TempSrc: Temporal  PainSc:                  Foye Deer

## 2021-01-27 NOTE — Op Note (Signed)
OPERATIVE NOTE  DATE OF SURGERY:  01/27/2021  PATIENT NAME:  Gina Matthews   DOB: [birth year]  MRN: [id number]  PRE-OPERATIVE DIAGNOSIS: Degenerative arthrosis of the right knee, primary  POST-OPERATIVE DIAGNOSIS:  Same  PROCEDURE:  Right total knee arthroplasty using computer-assisted navigation  SURGEON:  Jena Gauss. M.D.  ASSISTANT: Baldwin Jamaica, PA-C (present and scrubbed throughout the case, critical for assistance with exposure, retraction, instrumentation, and closure)  ANESTHESIA: spinal  ESTIMATED BLOOD LOSS: 75 mL  FLUIDS REPLACED: 1300 mL of crystalloid  TOURNIQUET TIME: 98 minutes  DRAINS: 2 medium Hemovac drains  SOFT TISSUE RELEASES: Anterior cruciate ligament, posterior cruciate ligament, deep  medial collateral ligament, patellofemoral ligament  IMPLANTS UTILIZED: DePuy Attune size 5N posterior stabilized femoral component (cemented), size 5 rotating platform tibial component (cemented), 32 mm medialized dome patella (cemented), and an 8 mm stabilized rotating platform polyethylene insert.  INDICATIONS FOR SURGERY: Gina Matthews is a 70 y.o. year old female with a long history of progressive knee pain. X-rays demonstrated severe degenerative changes in tricompartmental fashion. The patient had not seen any significant improvement despite conservative nonsurgical intervention. After discussion of the risks and benefits of surgical intervention, the patient expressed understanding of the risks benefits and agree with plans for total knee arthroplasty.   The risks, benefits, and alternatives were discussed at length including but not limited to the risks of infection, bleeding, nerve injury, stiffness, blood clots, the need for revision surgery, cardiopulmonary complications, among others, and they were willing to proceed.  PROCEDURE IN DETAIL: The patient was brought into the operating room and, after adequate spinal anesthesia was achieved, a tourniquet was  placed on the patient's upper thigh. The patient's knee and leg were cleaned and prepped with alcohol and DuraPrep and draped in the usual sterile fashion. A "timeout" was performed as per usual protocol. The lower extremity was exsanguinated using an Esmarch, and the tourniquet was inflated to 300 mmHg. An anterior longitudinal incision was made followed by a standard mid vastus approach. The deep fibers of the medial collateral ligament were elevated in a subperiosteal fashion off of the medial flare of the tibia so as to maintain a continuous soft tissue sleeve. The patella was subluxed laterally and the patellofemoral ligament was incised. Inspection of the knee demonstrated severe degenerative changes with full-thickness loss of articular cartilage. Osteophytes were debrided using a rongeur. Anterior and posterior cruciate ligaments were excised. Two 4.0 mm Schanz pins were inserted in the femur and into the tibia for attachment of the array of trackers used for computer-assisted navigation. Hip center was identified using a circumduction technique. Distal landmarks were mapped using the computer. The distal femur and proximal tibia were mapped using the computer. The distal femoral cutting guide was positioned using computer-assisted navigation so as to achieve a 5 distal valgus cut. The femur was sized and it was felt that a size 5N femoral component was appropriate. A size 5 femoral cutting guide was positioned and the anterior cut was performed and verified using the computer. This was followed by completion of the posterior and chamfer cuts. Femoral cutting guide for the central box was then positioned in the center box cut was performed.  Attention was then directed to the proximal tibia. Medial and lateral menisci were excised. The extramedullary tibial cutting guide was positioned using computer-assisted navigation so as to achieve a 0 varus-valgus alignment and 3 posterior slope. The cut was  performed and verified using the computer. The proximal tibia was sized  and it was felt that a size 5 tibial tray was appropriate. Tibial and femoral trials were inserted followed by insertion of an 8 mm polyethylene insert. This allowed for excellent mediolateral soft tissue balancing both in flexion and in full extension. Finally, the patella was cut and prepared so as to accommodate a 32 mm medialized dome patella. A patella trial was placed and the knee was placed through a range of motion with excellent patellar tracking appreciated. The femoral trial was removed after debridement of posterior osteophytes. The central post-hole for the tibial component was reamed followed by insertion of a keel punch. Tibial trials were then removed. Cut surfaces of bone were irrigated with copious amounts of normal saline using pulsatile lavage and then suctioned dry. Polymethylmethacrylate cement with gentamicin was prepared in the usual fashion using a vacuum mixer. Cement was applied to the cut surface of the proximal tibia as well as along the undersurface of a size 5 rotating platform tibial component. Tibial component was positioned and impacted into place. Excess cement was removed using Personal assistant. Cement was then applied to the cut surfaces of the femur as well as along the posterior flanges of the size 5N femoral component. The femoral component was positioned and impacted into place. Excess cement was removed using Personal assistant. An 8 mm polyethylene trial was inserted and the knee was brought into full extension with steady axial compression applied. Finally, cement was applied to the backside of a 32 mm medialized dome patella and the patellar component was positioned and patellar clamp applied. Excess cement was removed using Personal assistant. After adequate curing of the cement, the tourniquet was deflated after a total tourniquet time of 98 minutes. Hemostasis was achieved using electrocautery. The knee  was irrigated with copious amounts of normal saline using pulsatile lavage followed by 500 ml of Surgiphor and then suctioned dry. 20 mL of 1.3% Exparel and 60 mL of 0.25% Marcaine in 40 mL of normal saline was injected along the posterior capsule, medial and lateral gutters, and along the arthrotomy site. An 8 mm stabilized rotating platform polyethylene insert was inserted and the knee was placed through a range of motion with excellent mediolateral soft tissue balancing appreciated and excellent patellar tracking noted. 2 medium drains were placed in the wound bed and brought out through separate stab incisions. The medial parapatellar portion of the incision was reapproximated using interrupted sutures of #1 Vicryl. Subcutaneous tissue was approximated in layers using first #0 Vicryl followed #2-0 Vicryl. The skin was approximated with skin staples. A sterile dressing was applied.  The patient tolerated the procedure well and was transported to the recovery room in stable condition.    Shahzad Thomann P. Angie Fava., M.D.

## 2021-01-28 ENCOUNTER — Encounter: Payer: Self-pay | Admitting: Orthopedic Surgery

## 2021-01-28 DIAGNOSIS — M1711 Unilateral primary osteoarthritis, right knee: Secondary | ICD-10-CM | POA: Diagnosis not present

## 2021-01-28 MED ORDER — OXYCODONE HCL 5 MG PO TABS
ORAL_TABLET | ORAL | Status: AC
  Administered 2021-01-28: 10 mg via ORAL
  Filled 2021-01-28: qty 2

## 2021-01-28 MED ORDER — TRAMADOL HCL 50 MG PO TABS
ORAL_TABLET | ORAL | Status: AC
  Filled 2021-01-28: qty 1

## 2021-01-28 MED ORDER — TRAMADOL HCL 50 MG PO TABS
ORAL_TABLET | ORAL | Status: AC
  Administered 2021-01-28: 50 mg via ORAL
  Filled 2021-01-28: qty 1

## 2021-01-28 MED ORDER — CELECOXIB 200 MG PO CAPS
ORAL_CAPSULE | ORAL | Status: AC
  Administered 2021-01-28: 200 mg via ORAL
  Filled 2021-01-28: qty 1

## 2021-01-28 MED ORDER — METOCLOPRAMIDE HCL 10 MG PO TABS
ORAL_TABLET | ORAL | Status: AC
  Administered 2021-01-28: 10 mg via ORAL
  Filled 2021-01-28: qty 1

## 2021-01-28 MED ORDER — OXYCODONE HCL 5 MG PO TABS
ORAL_TABLET | ORAL | Status: AC
  Filled 2021-01-28: qty 2

## 2021-01-28 MED ORDER — ALUM & MAG HYDROXIDE-SIMETH 200-200-20 MG/5ML PO SUSP
ORAL | Status: AC
  Filled 2021-01-28: qty 30

## 2021-01-28 MED ORDER — CARVEDILOL 12.5 MG PO TABS
ORAL_TABLET | ORAL | Status: AC
  Administered 2021-01-28: 6.25 mg via ORAL
  Filled 2021-01-28: qty 1

## 2021-01-28 MED ORDER — ACETAMINOPHEN 10 MG/ML IV SOLN
INTRAVENOUS | Status: AC
  Administered 2021-01-28: 1000 mg via INTRAVENOUS
  Filled 2021-01-28: qty 100

## 2021-01-28 MED ORDER — SODIUM CHLORIDE FLUSH 0.9 % IV SOLN
INTRAVENOUS | Status: AC
  Filled 2021-01-28: qty 10

## 2021-01-28 MED ORDER — CEFAZOLIN SODIUM-DEXTROSE 2-4 GM/100ML-% IV SOLN
INTRAVENOUS | Status: AC
  Administered 2021-01-28: 2 g via INTRAVENOUS
  Filled 2021-01-28: qty 100

## 2021-01-28 MED ORDER — METOCLOPRAMIDE HCL 10 MG PO TABS
ORAL_TABLET | ORAL | Status: AC
  Filled 2021-01-28: qty 1

## 2021-01-28 MED ORDER — MAGNESIUM HYDROXIDE 400 MG/5ML PO SUSP
ORAL | Status: AC
  Filled 2021-01-28: qty 30

## 2021-01-28 NOTE — Progress Notes (Signed)
Physical Therapy Treatment Patient Details Name: Gina Matthews MRN: [id number] DOB: 16-May-1950 Today's Date: 01/28/2021   History of Present Illness admitted for acute hospitalization s/p R TKA, WBAT (01/27/21)    PT Comments    Progressive increase in gait distance, cga/close sup to complete distance of 150' with RW.  Slow and deliberate, but no overt buckling or LOB. Good R knee control in loading. Will plan to initiate stair training required for discharge next date.     Recommendations for follow up therapy are one component of a multi-disciplinary discharge planning process, led by the attending physician.  Recommendations may be updated based on patient status, additional functional criteria and insurance authorization.  Follow Up Recommendations  Follow physician's recommendations for discharge plan and follow up therapies     Assistance Recommended at Discharge PRN  Equipment Recommendations  Rolling walker (2 wheels);BSC/3in1    Recommendations for Other Services       Precautions / Restrictions Precautions Precautions: None Restrictions Weight Bearing Restrictions: Yes RLE Weight Bearing: Weight bearing as tolerated     Mobility  Bed Mobility Overal bed mobility: Modified Independent Bed Mobility: Supine to Sit           General bed mobility comments: self-assists R LE as needed    Transfers Overall transfer level: Needs assistance Equipment used: Rolling walker (2 wheels) Transfers: Sit to/from Stand Sit to Stand: Min guard;Supervision           General transfer comment: mild use of momentum for initial lift off; definite use of hands required    Ambulation/Gait Ambulation/Gait assistance: Min guard Gait Distance (Feet): 150 Feet Assistive device: Rolling walker (2 wheels)         General Gait Details: step to gait pattern, intermittent reciprocal stepping pattern.  Decreased step height/length and overall foot clearance bilat; L LE  limited by decreased stance time/WBing R LE.  Maintains significant forward trunk flexion, mod WBing bilat UEs.  1-2 standing rest periods required to complete distance.   Stairs             Wheelchair Mobility    Modified Rankin (Stroke Patients Only)       Balance Overall balance assessment: Needs assistance Sitting-balance support: No upper extremity supported;Feet supported Sitting balance-Leahy Scale: Normal     Standing balance support: Bilateral upper extremity supported Standing balance-Leahy Scale: Fair                              Cognition Arousal/Alertness: Awake/alert Behavior During Therapy: WFL for tasks assessed/performed Overall Cognitive Status: Within Functional Limits for tasks assessed                                          Exercises Other Exercises Other Exercises: Seated LE therex, 1x10, active ROM: ankle pumps, LAQs with emphasis on knee flexion as able, marching. Good isolated muscle control with seated therex.    General Comments        Pertinent Vitals/Pain Pain Assessment: Faces Faces Pain Scale: Hurts little more Pain Location: R knee Pain Descriptors / Indicators: Aching;Guarding Pain Intervention(s): Limited activity within patient's tolerance;Premedicated before session;Monitored during session;Repositioned    Home Living                          Prior Function  PT Goals (current goals can now be found in the care plan section) Acute Rehab PT Goals Patient Stated Goal: to go home tomorrow when my support is available PT Goal Formulation: With patient Time For Goal Achievement: 02/11/21 Potential to Achieve Goals: Good Progress towards PT goals: Progressing toward goals    Frequency    BID      PT Plan Current plan remains appropriate    Co-evaluation              AM-PAC PT "6 Clicks" Mobility   Outcome Measure  Help needed turning from your back to  your side while in a flat bed without using bedrails?: None   Help needed moving to and from a bed to a chair (including a wheelchair)?: None Help needed standing up from a chair using your arms (e.g., wheelchair or bedside chair)?: A Little Help needed to walk in hospital room?: A Little Help needed climbing 3-5 steps with a railing? : A Little 6 Click Score: 17    End of Session Equipment Utilized During Treatment: Gait belt Activity Tolerance: Patient tolerated treatment well Patient left: in chair;with call bell/phone within reach Nurse Communication: Mobility status PT Visit Diagnosis: Other abnormalities of gait and mobility (R26.89);Pain Pain - Right/Left: Right Pain - part of body: Knee     Time: 1637-1700 PT Time Calculation (min) (ACUTE ONLY): 23 min  Charges:  $Gait Training: 8-22 mins $Therapeutic Exercise: 8-22 mins                     Graham Hyun H. Manson Passey, PT, DPT, NCS 01/28/21, 5:19 PM [phone]

## 2021-01-28 NOTE — Progress Notes (Signed)
  Subjective: 1 Day Post-Op Procedure(s) (LRB): COMPUTER ASSISTED TOTAL KNEE ARTHROPLASTY (Right) Patient reports pain as well-controlled.   Patient is well, and has had no acute complaints or problems Plan is to go Home after hospital stay. Negative for chest pain and shortness of breath Fever: no Gastrointestinal: negative for nausea and vomiting.  Patient has not had a bowel movement.  Objective: Vital signs in last 24 hours: Temp:  [96.7 F (35.9 C)-97.7 F (36.5 C)] 97.3 F (36.3 C) (11/08 0725) Pulse Rate:  [62-88] 68 (11/08 0725) Resp:  [10-23] 17 (11/08 0725) BP: (109-140)/(66-85) 118/68 (11/08 0725) SpO2:  [94 %-100 %] 98 % (11/08 0725) Weight:  [65.8 kg] 65.8 kg (11/07 1154)  Intake/Output from previous day:  Intake/Output Summary (Last 24 hours) at 01/28/2021 0835 Last data filed at 01/28/2021 0539 Gross per 24 hour  Intake 2420 ml  Output 1435 ml  Net 985 ml    Intake/Output this shift: No intake/output data recorded.  Labs: No results for input(s): HGB in the last 72 hours. No results for input(s): WBC, RBC, HCT, PLT in the last 72 hours. No results for input(s): NA, K, CL, CO2, BUN, CREATININE, GLUCOSE, CALCIUM in the last 72 hours. No results for input(s): LABPT, INR in the last 72 hours.   EXAM General - Patient is Alert, Appropriate, and Oriented Extremity - Neurovascular intact Dorsiflexion/Plantar flexion intact Compartment soft Dressing/Incision -Postoperative dressing remains in place., Polar Care in place and working. , Hemovac in place.  Motor Function - intact, moving foot and toes well on exam. Able to perform independent SLR.  Cardiovascular- Regular rate and rhythm, no murmurs/rubs/gallops Respiratory- Lungs clear to auscultation bilaterally Gastrointestinal- soft, nontender, and active bowel sounds   Assessment/Plan: 1 Day Post-Op Procedure(s) (LRB): COMPUTER ASSISTED TOTAL KNEE ARTHROPLASTY (Right) Active Problems:   Total knee  replacement status  Estimated body mass index is 24.89 kg/m as calculated from the following:   Height as of this encounter: 5\' 4"  (1.626 m).   Weight as of this encounter: 65.8 kg. Advance diet Up with therapy  Patient does not have support at home until tomorrow. Will monitor progress with therapy today.     DVT Prophylaxis - Lovenox, Ted hose, and foot pumps Weight-Bearing as tolerated to right leg  , PA-C [facility] 01/28/2021, 8:35 AM

## 2021-01-28 NOTE — Evaluation (Signed)
Physical Therapy Evaluation Patient Details Name: Gina Matthews MRN: [id number] DOB: [birth year] Today's Date: 01/28/2021  History of Present Illness  admitted for acute hospitalization s/p R TKA, WBAT (01/27/21)  Clinical Impression  Resting in bed upon arrival to room; alert and oriented, eager for OOB mobility (but quick to voice goal of staying overnight until support arrives next date).  Meds received prior to session; pain well-controlled (FACES 4/10) to R knee.  Demonstrates good post-op strength (at least 3-/5) and ROM (8-73 degrees) to R knee; excellent R quad activation and control.  Easily completes SLR with minimal/no lag. Able to complete bed mobility with close sup; sit/stand, basic transfers and gait (50') with RW, cga/min assist.  Demonstrates   step to progressing towards partially reciprocal stepping pattern; limited stance time and WBing R LE, but no overt buckling.  Mod WBing bilat UEs with forward flexed trunk posture (baseline scoliosis).  Anticipate good gait/mobility progression in subsequent sessions. Would benefit from skilled PT to address above deficits and promote optimal return to PLOF.; recommend discharge to home with follow up as recommended by attending physician.     Recommendations for follow up therapy are one component of a multi-disciplinary discharge planning process, led by the attending physician.  Recommendations may be updated based on patient status, additional functional criteria and insurance authorization.  Follow Up Recommendations Follow physician's recommendations for discharge plan and follow up therapies    Assistance Recommended at Discharge PRN  Functional Status Assessment Patient has had a recent decline in their functional status and demonstrates the ability to make significant improvements in function in a reasonable and predictable amount of time.  Equipment Recommendations  Rolling walker (2 wheels);BSC/3in1    Recommendations for  Other Services       Precautions / Restrictions Precautions Precautions: None Restrictions Weight Bearing Restrictions: Yes RLE Weight Bearing: Weight bearing as tolerated      Mobility  Bed Mobility Overal bed mobility: Needs Assistance Bed Mobility: Supine to Sit     Supine to sit: Supervision          Transfers Overall transfer level: Needs assistance Equipment used: Rolling walker (2 wheels) Transfers: Sit to/from Stand Sit to Stand: Min guard           General transfer comment: cuing for hand placement; educated in R LE positioning and WBing    Ambulation/Gait Ambulation/Gait assistance: Min guard Gait Distance (Feet): 50 Feet Assistive device: Rolling walker (2 wheels)         General Gait Details: step to progressing towards partially reciprocal stepping pattern; limited stance time and WBing R LE, but no overt buckling.  Mod WBing bilat UEs with forward flexed trunk posture (baseline scoliosis)  Stairs            Wheelchair Mobility    Modified Rankin (Stroke Patients Only)       Balance Overall balance assessment: Needs assistance Sitting-balance support: No upper extremity supported;Feet supported Sitting balance-Leahy Scale: Normal     Standing balance support: Bilateral upper extremity supported Standing balance-Leahy Scale: Fair                               Pertinent Vitals/Pain Pain Assessment: Faces Faces Pain Scale: Hurts little more Pain Location: R knee Pain Descriptors / Indicators: Aching;Guarding Pain Intervention(s): Limited activity within patient's tolerance;Monitored during session;Premedicated before session;Repositioned    Home Living Family/patient expects to be discharged to:: Private residence Living  Arrangements: Alone Available Help at Discharge: Family;Available PRN/intermittently Type of Home: House Home Access: Stairs to enter Entrance Stairs-Rails: Right Entrance Stairs-Number of  Steps: 5   Home Layout: One level (Bonus room upstairs; no essential needs upstairs) Home Equipment: None      Prior Function Prior Level of Function : 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  L LE grossly WFL.  Full sensory return noted)       Communication   Communication: No difficulties  Cognition Arousal/Alertness: Awake/alert Behavior During Therapy: WFL for tasks assessed/performed Overall Cognitive Status: Within Functional Limits for tasks assessed                                          General Comments      Exercises Total Joint Exercises Goniometric ROM: R knee: 8-73 degrees Other Exercises Other Exercises: Educated in role of PT and progressive mobility; reviewed WBing restrictions and ROM goals.  Patient voiced understanding. Other Exercises: Supine/seated LE therex, 1x5, for muscular strength/flexibility.  Good R quad activation and control; good SLR with minimal/no lag.   Assessment/Plan    PT Assessment Patient needs continued PT services  PT Problem List Decreased strength;Decreased range of motion;Decreased activity tolerance;Decreased balance;Decreased mobility;Decreased knowledge of use of DME;Decreased knowledge of precautions;Decreased safety awareness;Pain       PT Treatment Interventions DME instruction;Gait training;Stair training;Functional mobility training;Therapeutic activities;Therapeutic exercise;Balance training;Patient/family education    PT Goals (Current goals can be found in the Care Plan section)  Acute Rehab PT Goals Patient Stated Goal: to go home tomorrow when my support is available PT Goal Formulation: With  patient Time For Goal Achievement: 02/11/21 Potential to Achieve Goals: Good    Frequency BID   Barriers to discharge        Co-evaluation               AM-PAC PT "6 Clicks" Mobility  Outcome Measure Help needed turning from your back to your side while in a flat bed without using bedrails?: None Help needed moving from lying on your back to sitting on the side of a flat bed without using bedrails?: None Help needed moving to and from a bed to a chair (including a wheelchair)?: A Little Help needed standing up from a chair using your arms (e.g., wheelchair or bedside chair)?: A Little Help needed to walk in hospital room?: A Little Help needed climbing 3-5 steps with a railing? : A Little 6 Click Score: 20    End of Session Equipment Utilized During Treatment: Gait belt Activity Tolerance: Patient tolerated treatment well Patient left: in chair;with call bell/phone within reach Nurse Communication: Mobility status PT Visit Diagnosis: Other abnormalities of gait and mobility (R26.89);Pain Pain - Right/Left: Right Pain - part of body: Knee    Time: 0925-0952 PT Time Calculation (min) (ACUTE ONLY): 27 min   Charges:   PT Evaluation $PT Eval Moderate Complexity: 1 Mod PT Treatments $Therapeutic Exercise: 8-22 mins      Ravon Mortellaro H. Manson Passey, PT, DPT, NCS 01/28/21,  10:14 AM [phone]

## 2021-01-28 NOTE — Progress Notes (Signed)
Occupational Therapy Evaluation Patient Details Name: Gina Matthews MRN: [id number] DOB: 02/21/1951 Today's Date: 01/28/2021   History of Present Illness admitted for acute hospitalization s/p R TKA, WBAT (01/27/21)   Clinical Impression   Ms. Meuth was seen for OT evaluation this date. Prior to hospital admission, pt was independent. Pt lives alone but has friends who can assist during her recovery. Currently pt demonstrates impairments as described below (See OT problem list) which functionally limit her ability to perform ADL/self-care tasks. Pt currently requires MOD I for donning/doffing socks, sitting in chair. Pt requires CGA + RW + VCs for toilet t/f to regular height toilet, ambulating~ 69ft. Pt IND in perihygeine. Close SUP for grooming, toothbrushing standing sinkside, ~ 2 min. Pt would benefit from skilled OT services to address noted impairments and functional limitations (see below for any additional details) in order to maximize safety and independence while minimizing falls risk and caregiver burden. Upon hospital discharge, recommend no follow up OT.     Recommendations for follow up therapy are one component of a multi-disciplinary discharge planning process, led by the attending physician.  Recommendations may be updated based on patient status, additional functional criteria and insurance authorization.   Follow Up Recommendations  No OT follow up    Assistance Recommended at Discharge PRN  Functional Status Assessment  Patient has had a recent decline in their functional status and demonstrates the ability to make significant improvements in function in a reasonable and predictable amount of time.  Equipment Recommendations  BSC/3in1;Other (comment) (requests RW)    Recommendations for Other Services       Precautions / Restrictions Precautions Precautions: None Restrictions Weight Bearing Restrictions: Yes RLE Weight Bearing: Weight bearing as tolerated       Mobility Bed Mobility Overal bed mobility: Needs Assistance Bed Mobility: Supine to Sit     Supine to sit: Supervision     General bed mobility comments: Received and left in chair.    Transfers Overall transfer level: Needs assistance Equipment used: Rolling walker (2 wheels) Transfers: Sit to/from Stand Sit to Stand: Supervision           General transfer comment: cuing for hand placement; educated in R LE positioning and WBing      Balance Overall balance assessment: Needs assistance Sitting-balance support: No upper extremity supported;Feet supported Sitting balance-Leahy Scale: Normal     Standing balance support: Bilateral upper extremity supported Standing balance-Leahy Scale: Fair                             ADL either performed or assessed with clinical judgement   ADL Overall ADL's : Needs assistance/impaired                                       General ADL Comments: MOD I don/doff socks, sitting in chair. CGA + RW + VCs for functional mobility, ambulating household distances ~ 40 ft. CGA + RW + VCs for toilet t/f to reg height toilet. Close SUP for grooming, toothbrushing standing sinkside, ~ 2 min.      Pertinent Vitals/Pain Pain Assessment: 0-10 Pain Score: 7  Faces Pain Scale: Hurts little more Pain Location: R knee Pain Descriptors / Indicators: Aching;Guarding Pain Intervention(s): Limited activity within patient's tolerance;Premedicated before session;Repositioned     Hand Dominance     Extremity/Trunk Assessment Upper Extremity Assessment  Upper Extremity Assessment: Overall WFL for tasks assessed   Lower Extremity Assessment Lower Extremity Assessment: RLE deficits/detail RLE Deficits / Details: s/p R knee arthroplasty       Communication Communication Communication: No difficulties   Cognition Arousal/Alertness: Awake/alert Behavior During Therapy: WFL for tasks assessed/performed Overall  Cognitive Status: Within Functional Limits for tasks assessed                                       General Comments       Exercises Exercises: Other exercises Total Joint Exercises Goniometric ROM: R knee: 8-73 degrees Other Exercises Other Exercises: Pt educ re: OT role, polar care, d/c recs, falls prevention Other Exercises: Sit<>stand, polar care, toilet t/f, toothbrushing   Shoulder Instructions      Home Living Family/patient expects to be discharged to:: Private residence Living Arrangements: Alone Available Help at Discharge: Family;Available PRN/intermittently Type of Home: House Home Access: Stairs to enter [employer] of Steps: 5 Entrance Stairs-Rails: Right Home Layout: One level;Other (Comment) (Bonus room upstairs; no essential needs upstairs)     Bathroom Shower/Tub: Walk-in shower;Other (comment) (w/ grab bars)   Bathroom Toilet: Standard     Home Equipment: None          Prior Functioning/Environment Prior Level of Function : Independent/Modified Independent                        OT Problem List: Decreased strength;Decreased activity tolerance;Impaired balance (sitting and/or standing);Decreased knowledge of use of DME or AE      OT Treatment/Interventions:      OT Goals(Current goals can be found in the care plan section) Acute Rehab OT Goals Patient Stated Goal: to go home OT Goal Formulation: With patient Time For Goal Achievement: 02/11/21 Potential to Achieve Goals: Good ADL Goals Pt Will Perform Grooming: Independently;standing Pt Will Perform Lower Body Dressing: Independently;sit to/from stand Pt Will Transfer to Toilet: Independently;regular height toilet  OT Frequency:     Barriers to D/C:            Co-evaluation              AM-PAC OT "6 Clicks" Daily Activity     Outcome Measure Help from another person eating meals?: None Help from another person taking care of personal  grooming?: A Little Help from another person toileting, which includes using toliet, bedpan, or urinal?: A Little Help from another person bathing (including washing, rinsing, drying)?: A Little Help from another person to put on and taking off regular upper body clothing?: None Help from another person to put on and taking off regular lower body clothing?: A Little 6 Click Score: 20   End of Session Equipment Utilized During Treatment: Gait belt;Rolling walker (2 wheels) Nurse Communication: Patient requests pain meds  Activity Tolerance: Patient tolerated treatment well Patient left: in chair  OT Visit Diagnosis: Unsteadiness on feet (R26.81);Other abnormalities of gait and mobility (R26.89);Muscle weakness (generalized) (M62.81)                Time: 7628-3151 OT Time Calculation (min): 25 min Charges:    [employer], Adine Madura 01/28/2021, 12:15 PM

## 2021-01-28 NOTE — Progress Notes (Signed)
Patient up to restroom. Ambulating well. Using walker. Patient denies any needs at this time. Patient back in bed with bone foam in place, foot pumps removed for allotted time. Polar care refilled with ice.

## 2021-01-29 DIAGNOSIS — M1711 Unilateral primary osteoarthritis, right knee: Secondary | ICD-10-CM | POA: Diagnosis not present

## 2021-01-29 MED ORDER — CARVEDILOL 12.5 MG PO TABS
ORAL_TABLET | ORAL | Status: AC
  Administered 2021-01-29: 6.25 mg via ORAL
  Filled 2021-01-29: qty 1

## 2021-01-29 MED ORDER — GABAPENTIN 300 MG PO CAPS
ORAL_CAPSULE | ORAL | Status: AC
  Administered 2021-01-29: 600 mg
  Filled 2021-01-29: qty 2

## 2021-01-29 MED ORDER — OXYCODONE HCL 5 MG PO TABS
ORAL_TABLET | ORAL | Status: AC
  Administered 2021-01-29: 10 mg via ORAL
  Filled 2021-01-29: qty 2

## 2021-01-29 MED ORDER — ENOXAPARIN SODIUM 40 MG/0.4ML IJ SOSY: 40.0000 mg | PREFILLED_SYRINGE | INTRAMUSCULAR | 0 refills | Status: DC

## 2021-01-29 MED ORDER — METOCLOPRAMIDE HCL 10 MG PO TABS
ORAL_TABLET | ORAL | Status: AC
  Administered 2021-01-29: 10 mg via ORAL
  Filled 2021-01-29: qty 1

## 2021-01-29 MED ORDER — OXYCODONE HCL 5 MG PO TABS
5.0000 mg | ORAL_TABLET | ORAL | 0 refills | Status: DC | PRN
Start: 2021-01-29 — End: 2022-04-02

## 2021-01-29 MED ORDER — CELECOXIB 200 MG PO CAPS
ORAL_CAPSULE | ORAL | Status: AC
  Administered 2021-01-29: 200 mg via ORAL
  Filled 2021-01-29: qty 1

## 2021-01-29 MED ORDER — TRAMADOL HCL 50 MG PO TABS: 50.0000 mg | ORAL_TABLET | ORAL | 0 refills | Status: DC | PRN

## 2021-01-29 MED ORDER — CELECOXIB 200 MG PO CAPS: 200.0000 mg | ORAL_CAPSULE | Freq: Two times a day (BID) | ORAL | 0 refills | Status: DC

## 2021-01-29 MED ORDER — TRAMADOL HCL 50 MG PO TABS
ORAL_TABLET | ORAL | Status: AC
  Filled 2021-01-29: qty 2

## 2021-01-29 NOTE — TOC Initial Note (Signed)
Transition of Care [facility]) - Initial/Assessment Note    Patient Details  Name: Vala Raffo MRN: [id number] Date of Birth: 06-Dec-1950  [facility] of [facility]) CM/SW Contact:    Joseph Art, LCSWA Phone Number: 01/29/2021, 11:55 AM  Clinical Narrative:                        Patient will be discharging with DME: bedside commode and rolling walker.  DME request sent.  Patient Goals and CMS Choice        Expected Discharge Plan and Services           Expected Discharge Date: 01/29/21                                    Prior Living Arrangements/Services                       Activities of Daily Living Home Assistive Devices/Equipment: None ADL Screening (condition at time of admission) Patient's cognitive ability adequate to safely complete daily activities?: Yes Is the patient deaf or have difficulty hearing?: No Does the patient have difficulty seeing, even when wearing glasses/contacts?: No Does the patient have difficulty concentrating, remembering, or making decisions?: No Patient able to express need for assistance with ADLs?: Yes Does the patient have difficulty dressing or bathing?: No Independently performs ADLs?: Yes (appropriate for developmental age) Does the patient have difficulty walking or climbing stairs?: No Weakness of Legs: None Weakness of Arms/Hands: None  Permission Sought/Granted                  Emotional Assessment              Admission diagnosis:  Total knee replacement status [Z96.659] Patient Active Problem List   Diagnosis Date Noted   Total knee replacement status 01/27/2021   Gastroesophageal reflux disease with hiatal hernia 10/03/2020   Tachycardia 06/28/2020   Major depressive disorder, recurrent, moderate (HCC) 11/23/2019   Age-related osteoporosis without current pathological fracture 07/17/2019   Primary osteoarthritis of right knee 11/01/2018   Abnormal electrocardiogram (ECG) (EKG)  10/01/2018   Dental injury 10/01/2018   Maxillary fracture (HCC) 10/01/2018   Syncope 10/01/2018   Kyphosis of thoracic region 07/30/2017   Chronic midline low back pain without sciatica 07/30/2017   Hyponatremia 11/20/2016   Chronic pain syndrome 12/23/2014   Fibromyalgia muscle pain 12/23/2014   Allergic rhinitis 12/15/2014   Arthritis involving multiple sites 12/15/2014   Chronic insomnia 12/15/2014   Arthritis of both knees 07/31/2014   Seasonal allergies 07/31/2014   Hypertension, essential, benign 06/08/2014   Alcohol dependence in early, early partial, sustained full, or sustained partial remission (HCC) 01/03/2014   PCP:  Orene Desanctis, MD Pharmacy:   [facility] [address] ([address]), [place] - [address] [place]) [place] [address] Phone: [phone] Fax: [phone]     Social Determinants of Health (SDOH) Interventions    Readmission Risk Interventions No flowsheet data found.

## 2021-01-29 NOTE — Progress Notes (Signed)
Physical Therapy Treatment Patient Details Name: Gina Matthews MRN: [id number] DOB: 1950/10/18 Today's Date: 01/29/2021   History of Present Illness admitted for acute hospitalization s/p R TKA, WBAT (01/27/21)    PT Comments    Pt was awake, alert and oriented and sitting in chair upon PT arrival. Pt was able to perform STS with use of hands, ambulate 200 feet, and perform stair navigation to simulate entering her home upon discharge. Pt  did require CGA with stair navigation. Pt has met all of her initial PT goals and has been cleared for D/C by PA. Ot will continue to require further stair navigation training with [facility] PT but is currently safe to access her home with assistance via her daughter. Pt was provided with extensive HEP as well as instruction in HEP to be completed 2-3 times per day and all her and her and her daughter's questions were addressed and answered prior to end of therapy session. Pt will benefit from continued PT in the home setting following d/c from hospital.   Recommendations for follow up therapy are one component of a multi-disciplinary discharge planning process, led by the attending physician.  Recommendations may be updated based on patient status, additional functional criteria and insurance authorization.  Follow Up Recommendations  Follow physician's recommendations for discharge plan and follow up therapies     Assistance Recommended at Discharge PRN  Equipment Recommendations  Rolling walker (2 wheels);BSC/3in1    Recommendations for Other Services       Precautions / Restrictions       Mobility  Bed Mobility Overal bed mobility: Independent Bed Mobility: Sit to Supine       Sit to supine: Independent        Transfers Overall transfer level: Modified independent Equipment used: Rolling walker (2 wheels) Transfers: Sit to/from Stand Sit to Stand: Min guard           General transfer comment: definite use of hands required     Ambulation/Gait Ambulation/Gait assistance: Min guard Gait Distance (Feet): 200 Feet Assistive device: Rolling walker (2 wheels) Gait Pattern/deviations: Step-to pattern;Decreased step length - right;Decreased step length - left       General Gait Details: step to gait pattern, intermittent reciprocal stepping pattern.  Decreased step height/length and overall foot clearance bilat; L LE limited by decreased stance time/WBing R LE.  Maintains significant forward trunk flexion, mod WBing bilat UEs.  1-2 standing rest periods required to complete distance.   Stairs Stairs: Yes Stairs assistance: Min guard Stair Management: One rail Right;Step to pattern (utlized B UE on R handrail) Number of Stairs: 4 General stair comments: Significant B UE reliance on R handrail   Wheelchair Mobility    Modified Rankin (Stroke Patients Only)       Balance Overall balance assessment: Needs assistance Sitting-balance support: No upper extremity supported;Feet supported Sitting balance-Leahy Scale: Normal     Standing balance support: Bilateral upper extremity supported Standing balance-Leahy Scale: Fair                              Cognition Arousal/Alertness: Awake/alert Behavior During Therapy: WFL for tasks assessed/performed Overall Cognitive Status: Within Functional Limits for tasks assessed                                          Exercises Total Joint  Exercises Quad Sets: Strengthening;Right;10 reps Heel Slides: Right;AROM;10 reps Straight Leg Raises: 5 reps;Strengthening;Right Goniometric ROM: R knee 6-93    General Comments        Pertinent Vitals/Pain Pain Assessment: 0-10 Pain Score: 7  Pain Location: R knee Pain Descriptors / Indicators: Aching;Guarding Pain Intervention(s): Limited activity within patient's tolerance;Monitored during session    Home Living Family/patient expects to be discharged to:: Private residence Living  Arrangements: Children                      Prior Function            PT Goals (current goals can now be found in the care plan section) Acute Rehab PT Goals Patient Stated Goal: D/C today PT Goal Formulation: With patient Time For Goal Achievement: 02/11/21 Potential to Achieve Goals: Good Progress towards PT goals: Goals met and updated - see care plan    Frequency    BID      PT Plan Current plan remains appropriate    Co-evaluation              AM-PAC PT "6 Clicks" Mobility   Outcome Measure  Help needed turning from your back to your side while in a flat bed without using bedrails?: None Help needed moving from lying on your back to sitting on the side of a flat bed without using bedrails?: None Help needed moving to and from a bed to a chair (including a wheelchair)?: None   Help needed to walk in hospital room?: A Little Help needed climbing 3-5 steps with a railing? : A Little 6 Click Score: 18    End of Session Equipment Utilized During Treatment: Gait belt Activity Tolerance: Patient tolerated treatment well Patient left: in bed;with call bell/phone within reach Nurse Communication: Mobility status PT Visit Diagnosis: Other abnormalities of gait and mobility (R26.89);Pain Pain - Right/Left: Right Pain - part of body: Knee     Time: 4276-7011 PT Time Calculation (min) (ACUTE ONLY): 32 min  Charges:  $Therapeutic Exercise: 8-22 mins $Therapeutic Activity: 8-22 mins                     Gina Matthews PT, DPT     Gina Matthews 01/29/2021, 12:57 PM

## 2021-01-29 NOTE — Progress Notes (Signed)
  Subjective: 2 Days Post-Op Procedure(s) (LRB): COMPUTER ASSISTED TOTAL KNEE ARTHROPLASTY (Right) Patient reports pain as moderate.   Patient is well, and has had no acute complaints or problems Plan is to go Home after hospital stay. Negative for chest pain and shortness of breath Fever: no Gastrointestinal: negative for nausea and vomiting.  Patient has not had a bowel movement.  Objective: Vital signs in last 24 hours: Temp:  [97.3 F (36.3 C)-97.9 F (36.6 C)] 97.9 F (36.6 C) (11/09 0400) Pulse Rate:  [68-76] 75 (11/09 0400) Resp:  [16-17] 16 (11/09 0400) BP: (112-142)/(60-74) 142/74 (11/09 0000) SpO2:  [98 %-100 %] 100 % (11/09 0400)  Intake/Output from previous day:  Intake/Output Summary (Last 24 hours) at 01/29/2021 0707 Last data filed at 01/29/2021 0653 Gross per 24 hour  Intake 800 ml  Output 950 ml  Net -150 ml    Intake/Output this shift: No intake/output data recorded.  Labs: No results for input(s): HGB in the last 72 hours. No results for input(s): WBC, RBC, HCT, PLT in the last 72 hours. No results for input(s): NA, K, CL, CO2, BUN, CREATININE, GLUCOSE, CALCIUM in the last 72 hours. No results for input(s): LABPT, INR in the last 72 hours.   EXAM General - Patient is Alert, Appropriate, and Oriented Extremity - Neurovascular intact Dorsiflexion/Plantar flexion intact Compartment soft Dressing/Incision -Postoperative dressing remains in place., Polar Care in place and working. , Hemovac in place. , Following removal of post-op dressing, no bloody drainage noted  Motor Function - intact, moving foot and toes well on exam.    Cardiovascular- Regular rate and rhythm, no murmurs/rubs/gallops Respiratory- Lungs clear to auscultation bilaterally Gastrointestinal- soft, nontender, and active bowel sounds   Assessment/Plan: 2 Days Post-Op Procedure(s) (LRB): COMPUTER ASSISTED TOTAL KNEE ARTHROPLASTY (Right) Active Problems:   Total knee replacement  status  Estimated body mass index is 24.89 kg/m as calculated from the following:   Height as of this encounter: 5\' 4"  (1.626 m).   Weight as of this encounter: 65.8 kg. Advance diet Up with therapy  D/c after completion of PT goals and BM.  Work on  Post-op dressing removed. , Hemovac removed., and Mini compression dressing applied.   DVT Prophylaxis - Lovenox, Ted hose, and foot pumps Weight-Bearing as tolerated to right leg  [employer], PA-C [facility] Orthopaedic Surgery 01/29/2021, 7:07 AM

## 2021-01-29 NOTE — Discharge Summary (Signed)
Physician Discharge Summary  Patient ID: Gina Matthews MRN: [id number] DOB/AGE: Feb 10, 1951 70 y.o.  Admit date: 01/27/2021 Discharge date: 01/29/2021  Admission Diagnoses:  Total knee replacement status [Z96.659]  Surgeries:Procedure(s): Right total knee arthroplasty using computer-assisted navigation   SURGEON:  Jena Gauss. M.D.   ASSISTANT: Baldwin Jamaica, PA-C (present and scrubbed throughout the case, critical for assistance with exposure, retraction, instrumentation, and closure)   ANESTHESIA: spinal   ESTIMATED BLOOD LOSS: 75 mL   FLUIDS REPLACED: 1300 mL of crystalloid   TOURNIQUET TIME: 98 minutes   DRAINS: 2 medium Hemovac drains   SOFT TISSUE RELEASES: Anterior cruciate ligament, posterior cruciate ligament, deep  medial collateral ligament, patellofemoral ligament   IMPLANTS UTILIZED: DePuy Attune size 5N posterior stabilized femoral component (cemented), size 5 rotating platform tibial component (cemented), 32 mm medialized dome patella (cemented), and an 8 mm stabilized rotating platform polyethylene insert.  Discharge Diagnoses: Patient Active Problem List   Diagnosis Date Noted   Total knee replacement status 01/27/2021   Gastroesophageal reflux disease with hiatal hernia 10/03/2020   Tachycardia 06/28/2020   Major depressive disorder, recurrent, moderate (HCC) 11/23/2019   Age-related osteoporosis without current pathological fracture 07/17/2019   Primary osteoarthritis of right knee 11/01/2018   Abnormal electrocardiogram (ECG) (EKG) 10/01/2018   Dental injury 10/01/2018   Maxillary fracture (HCC) 10/01/2018   Syncope 10/01/2018   Kyphosis of thoracic region 07/30/2017   Chronic midline low back pain without sciatica 07/30/2017   Hyponatremia 11/20/2016   Chronic pain syndrome 12/23/2014   Fibromyalgia muscle pain 12/23/2014   Allergic rhinitis 12/15/2014   Arthritis involving multiple sites 12/15/2014   Chronic insomnia 12/15/2014   Arthritis of  both knees 07/31/2014   Seasonal allergies 07/31/2014   Hypertension, essential, benign 06/08/2014   Alcohol dependence in early, early partial, sustained full, or sustained partial remission (HCC) 01/03/2014    Past Medical History:  Diagnosis Date   Anemia    Arthritis    Fibromyalgia    History of hiatal hernia    Hypertension      Transfusion:    Consultants (if any):   Discharged Condition: Improved  Hospital Course: Gina Matthews is an 70 y.o. female who was admitted 01/27/2021 with a diagnosis of right knee osteoarthritis and went to the operating room on 01/27/2021 and underwent right total knee arthroplasty. The patient received perioperative antibiotics for prophylaxis (see below). The patient tolerated the procedure well and was transported to PACU in stable condition. After meeting PACU criteria, the patient was subsequently transferred to the Orthopaedics/Rehabilitation unit.   The patient received DVT prophylaxis in the form of early mobilization, Lovenox, Foot Pumps, and TED hose. A sacral pad had been placed and heels were elevated off of the bed with rolled towels in order to protect skin integrity. Foley catheter was discontinued on postoperative day #0. Wound drains were discontinued on postoperative day #2. The surgical incision was healing well without signs of infection.  Physical therapy was initiated postoperatively for transfers, gait training, and strengthening. Occupational therapy was initiated for activities of daily living and evaluation for assisted devices. Rehabilitation goals were reviewed in detail with the patient. The patient made steady progress with physical therapy and physical therapy recommended discharge to Home.   The patient achieved the preliminary goals of this hospitalization and was felt to be medically and orthopaedically appropriate for discharge.  She was given perioperative antibiotics:  Anti-infectives (From admission, onward)     Start     Dose/Rate Route  Frequency Ordered Stop   01/27/21 2000  ceFAZolin (ANCEF) IVPB 2g/100 mL premix        2 g 200 mL/hr over 30 Minutes Intravenous Every 6 hours 01/27/21 1848 01/28/21 0313   01/27/21 1949  ceFAZolin (ANCEF) 2-4 GM/100ML-% IVPB       Note to Pharmacy: Rayann Heman   : cabinet override      01/27/21 1949 01/28/21 0114   01/27/21 1127  ceFAZolin (ANCEF) 2-4 GM/100ML-% IVPB       Note to Pharmacy: Mikey Bussing   : cabinet override      01/27/21 1127 01/27/21 2021   01/27/21 0600  ceFAZolin (ANCEF) IVPB 2g/100 mL premix        2 g 200 mL/hr over 30 Minutes Intravenous On call to O.R. 01/27/21 0406 01/27/21 1305     .  Recent vital signs:  Vitals:   01/29/21 0000 01/29/21 0400  BP: (!) 142/74   Pulse: 68 75  Resp: 16 16  Temp: 97.7 F (36.5 C) 97.9 F (36.6 C)  SpO2: 99% 100%    Recent laboratory studies:  No results for input(s): WBC, HGB, HCT, PLT, K, CL, CO2, BUN, CREATININE, GLUCOSE, CALCIUM, LABPT, INR in the last 72 hours.  Diagnostic Studies: DG Knee Right Port  Result Date: 01/27/2021 CLINICAL DATA:  Postop right knee EXAM: PORTABLE RIGHT KNEE - 1-2 VIEW COMPARISON:  CT right knee dated 09/10/2018 FINDINGS: Right knee arthroplasty. Overlying skin staples, mild soft tissue gas, and surgical drain. No fracture or dislocation is seen. IMPRESSION: Status post right knee arthroplasty, without evidence of complication. Electronically Signed   By: Charline Bills M.D.   On: 01/27/2021 17:22    Discharge Medications:   Allergies as of 01/29/2021   No Known Allergies      Medication List     STOP taking these medications    ibuprofen 200 MG tablet Commonly known as: ADVIL       TAKE these medications    acetaminophen 500 MG tablet Commonly known as: TYLENOL Take 1,000 mg by mouth every 6 (six) hours as needed for mild pain or moderate pain.   alendronate 70 MG tablet Commonly known as: FOSAMAX Take 70 mg by mouth once a week. Take  with a full glass of water on an empty stomach.   amLODipine 5 MG tablet Commonly known as: NORVASC Take 5 mg by mouth daily.   b complex vitamins capsule Take 1 capsule by mouth daily.   Biotin 10272 MCG Tabs Take 10,000 mcg by mouth daily.   carvedilol 6.25 MG tablet Commonly known as: COREG Take 6.25 mg by mouth 2 (two) times daily.   celecoxib 200 MG capsule Commonly known as: CELEBREX Take 1 capsule (200 mg total) by mouth 2 (two) times daily.   cetirizine 10 MG tablet Commonly known as: ZYRTEC Take 10 mg by mouth daily as needed for allergies.   DULoxetine HCl 60 MG Csdr Take 60 mg by mouth daily.   enoxaparin 40 MG/0.4ML injection Commonly known as: LOVENOX Inject 0.4 mLs (40 mg total) into the skin daily for 14 days.   gabapentin 600 MG tablet Commonly known as: NEURONTIN Take 600-1,200 mg by mouth See admin instructions. Take 600 mg in the morning and noon and 1200 mg at bedtime   lisinopril 40 MG tablet Commonly known as: ZESTRIL Take 40 mg by mouth daily.   multivitamin with minerals tablet Take 1 tablet by mouth daily. Senior   oxyCODONE 5 MG immediate  release tablet Commonly known as: Oxy IR/ROXICODONE Take 1 tablet (5 mg total) by mouth every 4 (four) hours as needed for severe pain.   SLOW FE PO Take 1 tablet by mouth daily.   traMADol 50 MG tablet Commonly known as: ULTRAM Take 50 mg by mouth every 4 (four) hours. What changed: Another medication with the same name was added. Make sure you understand how and when to take each.   traMADol 50 MG tablet Commonly known as: ULTRAM Take 1 tablet (50 mg total) by mouth every 4 (four) hours as needed for moderate pain. What changed: You were already taking a medication with the same name, and this prescription was added. Make sure you understand how and when to take each.   traZODone 50 MG tablet Commonly known as: DESYREL Take 50 mg by mouth at bedtime.   Vitamin D3 25 MCG tablet Commonly known  as: Vitamin D Take 1,000 Units by mouth daily.               Durable Medical Equipment  (From admission, onward)           Start     Ordered   01/27/21 1849  DME Walker rolling  Once       Question:  Patient needs a walker to treat with the following condition  Answer:  Total knee replacement status   01/27/21 1848   01/27/21 1849  DME Bedside commode  Once       Question:  Patient needs a bedside commode to treat with the following condition  Answer:  Total knee replacement status   01/27/21 1848            Disposition: Home with home health PT     Follow-up Information     Madelyn Flavors, PA-C Follow up on 02/11/2021.   Specialty: Orthopedic Surgery Why: at 10:45am Contact information: [address] [facility] [place] [address] [phone]         Donato Heinz, MD Follow up on 03/13/2021.   Specialty: Orthopedic Surgery Why: at 10:00am Contact information: [address] [facility] [address] [facility] [place] [address] [phone]                  Lasandra Beech, PA-C 01/29/2021, 7:10 AM

## 2021-02-21 DIAGNOSIS — Z8619 Personal history of other infectious and parasitic diseases: Secondary | ICD-10-CM | POA: Insufficient documentation

## 2021-07-24 ENCOUNTER — Other Ambulatory Visit: Payer: Self-pay | Admitting: Pediatrics

## 2021-07-24 DIAGNOSIS — Z1231 Encounter for screening mammogram for malignant neoplasm of breast: Secondary | ICD-10-CM

## 2021-08-25 ENCOUNTER — Ambulatory Visit
Admission: RE | Admit: 2021-08-25 | Discharge: 2021-08-25 | Disposition: A | Discharge status: Home / Self Care | Payer: Medicare Other | Source: Ambulatory Visit | Attending: Pediatrics | Admitting: Pediatrics

## 2021-08-25 DIAGNOSIS — Z1231 Encounter for screening mammogram for malignant neoplasm of breast: Secondary | ICD-10-CM | POA: Diagnosis not present

## 2022-02-01 DIAGNOSIS — M1612 Unilateral primary osteoarthritis, left hip: Secondary | ICD-10-CM | POA: Insufficient documentation

## 2022-03-23 NOTE — Discharge Instructions (Signed)
Instructions after Total Hip Replacement     Eola Waldrep P. Becker Christopher, Jr., M.D.     Dept. of Orthopaedics & Sports Medicine  [facility]  [address]  [place], [place]  [address]  Phone: [phone]   Fax: [phone]    DIET: . Drink plenty of non-alcoholic fluids. . Resume your normal diet. Include foods high in fiber.  ACTIVITY:  . You may use crutches or a walker with weight-bearing as tolerated, unless instructed otherwise. . You may be weaned off of the walker or crutches by your Physical Therapist.  . Do NOT reach below the level of your knees or cross your legs until allowed.    . Continue doing gentle exercises. Exercising will reduce the pain and swelling, increase motion, and prevent muscle weakness.   . Please continue to use the TED compression stockings for 6 weeks. You may remove the stockings at night, but should reapply them in the morning. . Do not drive or operate any equipment until instructed.  WOUND CARE:  . Continue to use ice packs periodically to reduce pain and swelling. . Keep the incision clean and dry. . You may bathe or shower after the staples are removed at the first office visit following surgery.  MEDICATIONS: . You may resume your regular medications. . Please take the pain medication as prescribed on the medication. . Do not take pain medication on an empty stomach. . You have been given a prescription for a blood thinner to prevent blood clots. Please take the medication as instructed. (NOTE: After completing a 2 week course of Lovenox, take one Enteric-coated aspirin once a day.) . Pain medications and iron supplements can cause constipation. Use a stool softener (Senokot or Colace) on a daily basis and a laxative (dulcolax or miralax) as needed. . Do not drive or drink alcoholic beverages when taking pain medications.  CALL THE OFFICE FOR: . Temperature above 101 degrees . Excessive bleeding or drainage on the dressing. . Excessive  swelling, coldness, or paleness of the toes. . Persistent nausea and vomiting.  FOLLOW-UP:  . You should have an appointment to return to the office in 6 weeks after surgery. . Arrangements have been made for continuation of Physical Therapy (either home therapy or outpatient therapy).     [facility] Department Directory         [email or web address]       [email or web address]          Cardiology  Appointments: [place] - [phone] [place] - [phone]  Endocrinology  Appointments: [place] - [phone] [place] - [phone]  Gastroenterology  Appointments: [place] - [phone] [place] - [phone]        General Surgery   Appointments: [place] - [phone]  Internal Medicine/Family Medicine  Appointments: [place] - [phone] [place] - [phone] [place] - [phone]  Metabolic and Weigh Loss Surgery  Appointments: [place] - [phone]        Neurology  Appointments: [place] - [phone] [place] - [phone]  Neurosurgery  Appointments: [place] - [phone]  Obstetrics & Gynecology  Appointments: [place] - [phone] [place] - [phone]        Pediatrics  Appointments: [place] - [phone] [place] - [phone]  Physiatry  Appointments: [place] -[phone]  Physical Therapy  Appointments: [place] - [phone] [place] - [phone]        Podiatry  Appointments: [place] - [phone] [place] - [phone]  Pulmonology  Appointments: [place] - [phone]  Rheumatology  Appointments: [place] - [phone]         Location: [facility]  [address] [place], [place]  [address]  [place] Location: [facility] [address] [place], [place]  [address]  [place] Location: [facility] [address] [place], [place]  [address]    

## 2022-04-02 ENCOUNTER — Encounter
Admission: RE | Admit: 2022-04-02 | Discharge: 2022-04-02 | Disposition: A | Discharge status: Home / Self Care | Payer: Medicare Other | Source: Ambulatory Visit | Attending: Orthopedic Surgery | Admitting: Orthopedic Surgery

## 2022-04-02 VITALS — BP 108/75 | HR 63 | Resp 14 | Ht 64.0 in | Wt 159.8 lb

## 2022-04-02 DIAGNOSIS — M1612 Unilateral primary osteoarthritis, left hip: Secondary | ICD-10-CM | POA: Diagnosis not present

## 2022-04-02 DIAGNOSIS — R9431 Abnormal electrocardiogram [ECG] [EKG]: Secondary | ICD-10-CM | POA: Diagnosis not present

## 2022-04-02 DIAGNOSIS — R000 Tachycardia, unspecified: Secondary | ICD-10-CM | POA: Insufficient documentation

## 2022-04-02 DIAGNOSIS — F1021 Alcohol dependence, in remission: Secondary | ICD-10-CM | POA: Insufficient documentation

## 2022-04-02 DIAGNOSIS — E871 Hypo-osmolality and hyponatremia: Secondary | ICD-10-CM | POA: Insufficient documentation

## 2022-04-02 DIAGNOSIS — I10 Essential (primary) hypertension: Secondary | ICD-10-CM | POA: Insufficient documentation

## 2022-04-02 DIAGNOSIS — Z01818 Encounter for other preprocedural examination: Secondary | ICD-10-CM | POA: Diagnosis present

## 2022-04-02 DIAGNOSIS — Z01812 Encounter for preprocedural laboratory examination: Secondary | ICD-10-CM

## 2022-04-02 HISTORY — DX: Abnormal electrocardiogram (ECG) (EKG): R94.31

## 2022-04-02 HISTORY — DX: Scoliosis, unspecified: M41.9

## 2022-04-02 HISTORY — DX: Tachycardia, unspecified: R00.0

## 2022-04-02 HISTORY — DX: Gastro-esophageal reflux disease without esophagitis: K21.9

## 2022-04-02 HISTORY — DX: Depression, unspecified: F32.A

## 2022-04-02 LAB — SEDIMENTATION RATE: Sed Rate: 4 mm/hr (ref 0–30)

## 2022-04-02 LAB — URINALYSIS, ROUTINE W REFLEX MICROSCOPIC
Bilirubin Urine: NEGATIVE
Glucose, UA: NEGATIVE mg/dL
Hgb urine dipstick: NEGATIVE
Ketones, ur: NEGATIVE mg/dL
Leukocytes,Ua: NEGATIVE
Nitrite: NEGATIVE
Protein, ur: NEGATIVE mg/dL
Specific Gravity, Urine: 1.024 (ref 1.005–1.030)
pH: 5 (ref 5.0–8.0)

## 2022-04-02 LAB — CBC WITH DIFFERENTIAL/PLATELET
Abs Immature Granulocytes: 0.01 10*3/uL (ref 0.00–0.07)
Basophils Absolute: 0.1 10*3/uL (ref 0.0–0.1)
Basophils Relative: 1 %
Eosinophils Absolute: 0.2 10*3/uL (ref 0.0–0.5)
Eosinophils Relative: 4 %
HCT: 39.7 % (ref 36.0–46.0)
Hemoglobin: 12.8 g/dL (ref 12.0–15.0)
Immature Granulocytes: 0 %
Lymphocytes Relative: 26 %
Lymphs Abs: 1.6 10*3/uL (ref 0.7–4.0)
MCH: 31.1 pg (ref 26.0–34.0)
MCHC: 32.2 g/dL (ref 30.0–36.0)
MCV: 96.6 fL (ref 80.0–100.0)
Monocytes Absolute: 0.7 10*3/uL (ref 0.1–1.0)
Monocytes Relative: 10 %
Neutro Abs: 3.8 10*3/uL (ref 1.7–7.7)
Neutrophils Relative %: 59 %
Platelets: 302 10*3/uL (ref 150–400)
RBC: 4.11 MIL/uL (ref 3.87–5.11)
RDW: 11.8 % (ref 11.5–15.5)
WBC: 6.4 10*3/uL (ref 4.0–10.5)
nRBC: 0 % (ref 0.0–0.2)

## 2022-04-02 LAB — TYPE AND SCREEN
ABO/RH(D): B POS
Antibody Screen: NEGATIVE

## 2022-04-02 LAB — SURGICAL PCR SCREEN
MRSA, PCR: NEGATIVE
Staphylococcus aureus: NEGATIVE

## 2022-04-02 LAB — COMPREHENSIVE METABOLIC PANEL
ALT: 17 U/L (ref 0–44)
AST: 21 U/L (ref 15–41)
Albumin: 3.9 g/dL (ref 3.5–5.0)
Alkaline Phosphatase: 63 U/L (ref 38–126)
Anion gap: 8 (ref 5–15)
BUN: 13 mg/dL (ref 8–23)
CO2: 25 mmol/L (ref 22–32)
Calcium: 8.9 mg/dL (ref 8.9–10.3)
Chloride: 101 mmol/L (ref 98–111)
Creatinine, Ser: 0.61 mg/dL (ref 0.44–1.00)
GFR, Estimated: >60 mL/min (ref >60)
Glucose, Bld: 83 mg/dL (ref 70–99)
Potassium: 4.1 mmol/L (ref 3.5–5.1)
Sodium: 134 mmol/L — ABNORMAL LOW (ref 135–145)
Total Bilirubin: 0.5 mg/dL (ref 0.3–1.2)
Total Protein: 6.6 g/dL (ref 6.5–8.1)

## 2022-04-02 LAB — C-REACTIVE PROTEIN: CRP: <0.5 mg/dL (ref <1.0)

## 2022-04-02 NOTE — Patient Instructions (Addendum)
Your procedure is scheduled on:04-15-22 Wednesday Report to the Registration Desk on the 1st floor of the [facility].Then proceed to the 2nd floor Surgery Desk To find out your arrival time, please call [phone] between 1PM - 3PM on:04-14-22 Tuesday If your arrival time is 6:00 am, do not arrive prior to that time as the [facility] entrance doors do not open until 6:00 am.  REMEMBER: Instructions that are not followed completely may result in serious medical risk, up to and including death; or upon the discretion of your surgeon and anesthesiologist your surgery may need to be rescheduled.  Do not eat food after midnight the night before surgery.  No gum chewing, lozengers or hard candies.  You may however, drink CLEAR liquids up to 2 hours before you are scheduled to arrive for your surgery. Do not drink anything within 2 hours of your scheduled arrival time.  Clear liquids include: - water  - apple juice without pulp - gatorade (not RED colors) - black coffee or tea (Do NOT add milk or creamers to the coffee or tea) Do NOT drink anything that is not on this list.  In addition, your doctor has ordered for you to drink the provided  Ensure Pre-Surgery Clear Carbohydrate Drink Drinking this carbohydrate drink up to two hours before surgery helps to reduce insulin resistance and improve patient outcomes. Please complete drinking 2 hours prior to scheduled arrival time.  TAKE THESE MEDICATIONS THE MORNING OF SURGERY WITH A SIP OF WATER: -carvedilol (COREG)  -DULoxetine   One week prior to surgery: Stop Anti-inflammatories (NSAIDS) such as Advil, Aleve, Ibuprofen, Motrin, Naproxen, Naprosyn and Aspirin based products such as Excedrin, Goodys Powder, BC Powder.You may however, continue to take Tylenol if needed for pain up until the day of surgery. You can continue your celecoxib (CELEBREX) up until the day prior to surgery  Stop ANY OVER THE COUNTER supplements/vitamins 7 days  prior to surgery (Biotin, B Complex, Ferrous Sulfate, Multivitamin and Vitamin D3)  No Alcohol for 24 hours before or after surgery.  No Smoking including e-cigarettes for 24 hours prior to surgery.  No chewable tobacco products for at least 6 hours prior to surgery.  No nicotine patches on the day of surgery.  Do not use any "recreational" drugs for at least a week prior to your surgery.  Please be advised that the combination of cocaine and anesthesia may have negative outcomes, up to and including death. If you test positive for cocaine, your surgery will be cancelled.  On the morning of surgery brush your teeth with toothpaste and water, you may rinse your mouth with mouthwash if you wish. Do not swallow any toothpaste or mouthwash.  Use CHG Soap as directed on instruction sheet.  Do not wear jewelry, make-up, hairpins, clips or nail polish.  Do not wear lotions, powders, or perfumes.   Do not shave body from the neck down 48 hours prior to surgery just in case you cut yourself which could leave a site for infection.  Also, freshly shaved skin may become irritated if using the CHG soap.  Contact lenses, hearing aids and dentures may not be worn into surgery.  Do not bring valuables to the hospital. [facility] is not responsible for any missing/lost belongings or valuables.   Notify your doctor if there is any change in your medical condition (cold, fever, infection).  Wear comfortable clothing (specific to your surgery type) to the hospital.  After surgery, you can help prevent lung  complications by doing breathing exercises.  Take deep breaths and cough every 1-2 hours. Your doctor may order a device called an Incentive Spirometer to help you take deep breaths. When coughing or sneezing, hold a pillow firmly against your incision with both hands. This is called "splinting." Doing this helps protect your incision. It also decreases belly discomfort.  If you are being admitted  to the hospital overnight, leave your suitcase in the car. After surgery it may be brought to your room.  If you are being discharged the day of surgery, you will not be allowed to drive home. You will need a responsible adult (18 years or older) to drive you home and stay with you that night.   If you are taking public transportation, you will need to have a responsible adult (18 years or older) with you. Please confirm with your physician that it is acceptable to use public transportation.   Please call the Pre-admissions Testing Dept. at [phone] if you have any questions about these instructions.  Surgery Visitation Policy:  Patients undergoing a surgery or procedure may have two family members or support persons with them as long as the person is not COVID-19 positive or experiencing its symptoms.   Inpatient Visitation:    Visiting hours are 7 a.m. to 8 p.m. Up to four visitors are allowed at one time in a patient room. The visitors may rotate out with other people during the day. One designated support person (adult) may remain overnight.  Due to an increase in RSV and influenza rates and associated hospitalizations, children ages 62 and under will not be able to visit patients in [facility]. Masks continue to be strongly recommended.   How to Use an Incentive Spirometer An incentive spirometer is a tool that measures how well you are filling your lungs with each breath. Learning to take long, deep breaths using this tool can help you keep your lungs clear and active. This may help to reverse or lessen your chance of developing breathing (pulmonary) problems, especially infection. You may be asked to use a spirometer: After a surgery. If you have a lung problem or a history of smoking. After a long period of time when you have been unable to move or be active. If the spirometer includes an indicator to show the highest number that you have reached, your health  care provider or respiratory therapist will help you set a goal. Keep a log of your progress as told by your health care provider. What are the risks? Breathing too quickly may cause dizziness or cause you to pass out. Take your time so you do not get dizzy or light-headed. If you are in pain, you may need to take pain medicine before doing incentive spirometry. It is harder to take a deep breath if you are having pain. How to use your incentive spirometer  Sit up on the edge of your bed or on a chair. Hold the incentive spirometer so that it is in an upright position. Before you use the spirometer, breathe out normally. Place the mouthpiece in your mouth. Make sure your lips are closed tightly around it. Breathe in slowly and as deeply as you can through your mouth, causing the piston or the ball to rise toward the top of the chamber. Hold your breath for 3-5 seconds, or for as long as possible. If the spirometer includes a coach indicator, use this to guide you in breathing. Slow down your breathing if  the indicator goes above the marked areas. Remove the mouthpiece from your mouth and breathe out normally. The piston or ball will return to the bottom of the chamber. Rest for a few seconds, then repeat the steps 10 or more times. Take your time and take a few normal breaths between deep breaths so that you do not get dizzy or light-headed. Do this every 1-2 hours when you are awake. If the spirometer includes a goal marker to show the highest number you have reached (best effort), use this as a goal to work toward during each repetition. After each set of 10 deep breaths, cough a few times. This will help to make sure that your lungs are clear. If you have an incision on your chest or abdomen from surgery, place a pillow or a rolled-up towel firmly against the incision when you cough. This can help to reduce pain while taking deep breaths and coughing. General tips When you are able to get out  of bed: Walk around often. Continue to take deep breaths and cough in order to clear your lungs. Keep using the incentive spirometer until your health care provider says it is okay to stop using it. If you have been in the hospital, you may be told to keep using the spirometer at home. Contact a health care provider if: You are having difficulty using the spirometer. You have trouble using the spirometer as often as instructed. Your pain medicine is not giving enough relief for you to use the spirometer as told. You have a fever. Get help right away if: You develop shortness of breath. You develop a cough with bloody mucus from the lungs. You have fluid or blood coming from an incision site after you cough. Summary An incentive spirometer is a tool that can help you learn to take long, deep breaths to keep your lungs clear and active. You may be asked to use a spirometer after a surgery, if you have a lung problem or a history of smoking, or if you have been inactive for a long period of time. Use your incentive spirometer as instructed every 1-2 hours while you are awake. If you have an incision on your chest or abdomen, place a pillow or a rolled-up towel firmly against your incision when you cough. This will help to reduce pain. Get help right away if you have shortness of breath, you cough up bloody mucus, or blood comes from your incision when you cough. This information is not intended to replace advice given to you by your health care provider. Make sure you discuss any questions you have with your health care provider. Document Revised: 05/29/2019 Document Reviewed: 05/29/2019 Elsevier Patient Education  [employer].

## 2022-04-12 NOTE — H&P (Signed)
ORTHOPAEDIC HISTORY & PHYSICAL Regino Bellow, PA - 04/06/2022 9:15 AM EST Formatting of this note is different from the original. Images from the original note were not included. Chief Complaint Chief Complaint Patient presents with Hip Pain H & P LEFT HIP  Reason for Visit Gina Matthews is a 72 y.o. who presents today for a history and physical. She is to undergo a left total hip arthroplasty on 04/15/2022. Since her last visit here to clinic there is been no change in her condition. Patient states that her pain continues to intensify to the point she is having difficulty with everyday activities of daily living and wishes to proceed with surgery.  She reports a 7-8 month history of left hip and buttocks pain. She has been evaluated by Neurosurgery for possible lumbar issues and was receiving lumbar epidural steroid injections without any significant improvement. The pain is worse with weight bearing and radiates from the left buttocks around the lateral thigh to the anterior thigh and groin. The hip pain limits the patient's ability to ambulate long distances. The patient has not appreciated any significant improvement despite Tylenol, NSAIDs, gabapentin, tramadol, activity modification, and ambulatory aids. She is using a walker for ambulation.  Past Medical History Past Medical History: Diagnosis Date Arthritis Chronic pain of right knee 07/30/2017 Fibromyalgia Gastroesophageal reflux disease with hiatal hernia 10/03/2020 History of shingles 02/21/2021 Hypertension Scoliosis  Past Surgical History Past Surgical History: Procedure Laterality Date Right total knee arthroplasty using computer-assisted navigation 01/27/2021 Dr Marry Guan CESAREAN SECTION 1980, 1982, 1984, 1986 x 4 foreign object extracted from stomach swallowed an arrow as a young child HERNIA REPAIR Left inguinal RHINOPLASTY age 50 fracture repair right foot surgery age 71; open reduction of fracture  Past  Family History Family History Problem Relation Age of Onset Anxiety Mother Depression Mother Diabetes type II Mother Hyperlipidemia (Elevated cholesterol) Mother High blood pressure (Hypertension) Mother Osteoarthritis Mother Thyroid disease Mother Coronary Artery Disease (Blocked arteries around heart) Father  Medications Current Outpatient Medications Ordered in Epic Medication Sig Dispense Refill acetaminophen (TYLENOL) 500 MG tablet Take 1,000 mg by mouth every 8 (eight) hours as needed for Pain alendronate (FOSAMAX) 70 MG tablet Take 1 tablet by mouth once a week 12 tablet 3 amLODIPine (NORVASC) 10 MG tablet Take 1 tablet (10 mg total) by mouth once daily 90 tablet 3 amLODIPine (NORVASC) 5 MG tablet Take 5 mg by mouth every evening b complex vitamins capsule Take 1 capsule by mouth once daily biotin 10 mg Tab Take 1 tablet by mouth once daily CALCIUM-VITAMIN D3 ORAL Take 1 caplet by mouth once daily carvediloL (COREG) 6.25 MG tablet TAKE 1 TABLET BY MOUTH TWICE DAILY WITH MEALS 180 tablet 3 celecoxib (CELEBREX) 200 MG capsule Take 1 capsule by mouth twice daily 60 capsule 2 cetirizine (ZYRTEC) 10 mg capsule Take 10 mg by mouth once daily as needed cholecalciferol (VITAMIN D3) 1000 unit tablet Take 1,000 Units by mouth once daily DULoxetine (CYMBALTA) 60 MG DR capsule Take 1 capsule by mouth once daily 90 capsule 2 famotidine (PEPCID) 20 MG tablet Take 20 mg by mouth 2 (two) times daily as needed ferrous sulfate 142 mg (45 mg iron) TbER Take 1 tablet by mouth once daily gabapentin (NEURONTIN) 600 MG tablet 2 tab in AM, 1 tab at noon, 2 tab in evening 150 tablet 11 geriatric multivitamin-min tablet Take 1 tablet by mouth once daily ibuprofen (MOTRIN) 200 MG tablet Take 400 mg by mouth every 4 (four) hours as needed for Pain  lisinopriL (ZESTRIL) 40 MG tablet Take 1 tablet by mouth once daily 90 tablet 3 [START ON 04/29/2022] traMADoL (ULTRAM) 50 mg tablet Take 1 tablet (50 mg total)  by mouth every 4 (four) hours 180 tablet 0 traZODone (DESYREL) 50 MG tablet Take 1 tablet by mouth nightly 90 tablet 3 [START ON 05/27/2022] traMADoL (ULTRAM) 50 mg tablet Take 1 tablet (50 mg total) by mouth every 4 (four) hours 180 tablet 0  No current Epic-ordered facility-administered medications on file.  Allergies No Known Allergies  Review of Systems A comprehensive 14 point ROS was performed, reviewed, and the pertinent orthopaedic findings are documented in the HPI.  Exam BP 124/80 (BP Location: Left upper arm, Patient Position: Sitting, BP Cuff Size: Adult)  Ht 165.1 cm (5\' 5" )  Wt 72.1 kg (159 lb)  BMI 26.46 kg/m  General: Well-developed well-nourished female seen in no acute distress. Patient presents today in a wheelchair.  HEENT: Atraumatic,normocephalic. Pupils are equal and reactive to light. Oropharynx is clear with moist mucosa  Lungs: Clear to auscultation bilaterally  Cardiovascular: Regular rate and rhythm. Normal S1, S2. No murmurs. No appreciable gallops or rubs. Peripheral pulses are palpable.  Abdomen: Soft, non-tender, nondistended. Bowel sounds present  Extremity: Left Hip: Pelvic tilt: Negative Limb lengths: Equal with the patient standing Soft tissue swelling: Negative Erythema: Negative Crepitance: Positive Tenderness: Greater trochanter is nontender to palpation. Severe pain is elicited by axial compression or extremes of rotation. Atrophy: No atrophy. Fair to good hip flexor and abductor strength. Range of Motion: EXT/FLEX: 0/0/110 ADD/ABD: 20/0/20 IR/ER: 20/0/20   Neurological:  The patient is alert and oriented Sensation to light touch appears to be intact and within normal limits Gross motor strength appeared to be equal to 5/5  Vascular :  Peripheral pulses felt to be palpable. Capillary refill appears to be intact and within normal limits  X-ray  X-rays taken on 01/27/2022 and [facility] clinic shows significant narrowing of  the cartilage space with bone-on-bone articulation. Subchondral sclerosis and subchondral cyst formation are noted. Osteophyte formation is present.  Impression  1. Degenerative arthrosis left hip  Plan  1. I have gone over the patient's medication on today's visit. She is not taking any medications that need to be discontinued. 2. Past medical history was reviewed 3. Did discuss postop rehab course 4. Return to clinic as scheduled postop. Sooner if any problems  This note was generated in part with voice recognition software and I apologize for any typographical errors that were not detected and corrected   Watt Climes PA Electronically signed by Regino Bellow, PA at 04/06/2022 8:58 AM EST

## 2022-04-15 ENCOUNTER — Ambulatory Visit: Payer: Medicare Other

## 2022-04-15 ENCOUNTER — Other Ambulatory Visit: Payer: Self-pay

## 2022-04-15 ENCOUNTER — Encounter
Admission: RE | Disposition: A | Discharge status: Home Health | Payer: Self-pay | Source: Home / Self Care | Attending: Orthopedic Surgery

## 2022-04-15 ENCOUNTER — Ambulatory Visit: Payer: Medicare Other | Admitting: Urgent Care

## 2022-04-15 ENCOUNTER — Observation Stay
Admission: RE | Admit: 2022-04-15 | Discharge: 2022-04-16 | Disposition: A | Discharge status: Home Health | Payer: Medicare Other | Attending: Orthopedic Surgery | Admitting: Orthopedic Surgery

## 2022-04-15 ENCOUNTER — Observation Stay: Payer: Medicare Other

## 2022-04-15 ENCOUNTER — Encounter: Payer: Self-pay | Admitting: Orthopedic Surgery

## 2022-04-15 DIAGNOSIS — Z79899 Other long term (current) drug therapy: Secondary | ICD-10-CM | POA: Insufficient documentation

## 2022-04-15 DIAGNOSIS — Z96642 Presence of left artificial hip joint: Secondary | ICD-10-CM

## 2022-04-15 DIAGNOSIS — Z96651 Presence of right artificial knee joint: Secondary | ICD-10-CM | POA: Insufficient documentation

## 2022-04-15 DIAGNOSIS — I10 Essential (primary) hypertension: Secondary | ICD-10-CM | POA: Diagnosis not present

## 2022-04-15 DIAGNOSIS — R000 Tachycardia, unspecified: Secondary | ICD-10-CM

## 2022-04-15 DIAGNOSIS — F1021 Alcohol dependence, in remission: Secondary | ICD-10-CM

## 2022-04-15 DIAGNOSIS — R9431 Abnormal electrocardiogram [ECG] [EKG]: Secondary | ICD-10-CM

## 2022-04-15 DIAGNOSIS — M1612 Unilateral primary osteoarthritis, left hip: Secondary | ICD-10-CM | POA: Diagnosis present

## 2022-04-15 DIAGNOSIS — E871 Hypo-osmolality and hyponatremia: Secondary | ICD-10-CM

## 2022-04-15 HISTORY — PX: TOTAL HIP ARTHROPLASTY: SHX124

## 2022-04-15 SURGERY — ARTHROPLASTY, HIP, TOTAL,POSTERIOR APPROACH
Anesthesia: Spinal | Site: Hip | Laterality: Left

## 2022-04-15 MED ORDER — DEXAMETHASONE SODIUM PHOSPHATE 10 MG/ML IJ SOLN
INTRAMUSCULAR | Status: AC
  Filled 2022-04-15: qty 1

## 2022-04-15 MED ORDER — CEFAZOLIN SODIUM-DEXTROSE 2-4 GM/100ML-% IV SOLN
2.0000 g | INTRAVENOUS | Status: AC
  Administered 2022-04-15: 2 g via INTRAVENOUS

## 2022-04-15 MED ORDER — METOCLOPRAMIDE HCL 10 MG PO TABS
ORAL_TABLET | ORAL | Status: AC
  Filled 2022-04-15: qty 1

## 2022-04-15 MED ORDER — GABAPENTIN 300 MG PO CAPS: 600.0000 mg | ORAL_CAPSULE | Freq: Every day | ORAL | Status: DC

## 2022-04-15 MED ORDER — ACETAMINOPHEN 10 MG/ML IV SOLN
1000.0000 mg | Freq: Four times a day (QID) | INTRAVENOUS | Status: AC
  Administered 2022-04-15 – 2022-04-16 (×2): 1000 mg via INTRAVENOUS

## 2022-04-15 MED ORDER — METOCLOPRAMIDE HCL 10 MG PO TABS
ORAL_TABLET | ORAL | Status: AC
  Administered 2022-04-15: 10 mg via ORAL
  Filled 2022-04-15: qty 1

## 2022-04-15 MED ORDER — OXYCODONE HCL 5 MG PO TABS
5.0000 mg | ORAL_TABLET | Freq: Once | ORAL | Status: AC | PRN
  Administered 2022-04-15: 5 mg via ORAL

## 2022-04-15 MED ORDER — CEFAZOLIN SODIUM-DEXTROSE 2-4 GM/100ML-% IV SOLN
INTRAVENOUS | Status: AC
  Administered 2022-04-15: 2 g via INTRAVENOUS
  Filled 2022-04-15: qty 100

## 2022-04-15 MED ORDER — LIDOCAINE HCL (PF) 2 % IJ SOLN
INTRAMUSCULAR | Status: AC
  Filled 2022-04-15: qty 5

## 2022-04-15 MED ORDER — MAGNESIUM HYDROXIDE 400 MG/5ML PO SUSP: 30.0000 mL | Freq: Every day | ORAL | Status: DC

## 2022-04-15 MED ORDER — CARVEDILOL 12.5 MG PO TABS: 6.2500 mg | ORAL_TABLET | Freq: Two times a day (BID) | ORAL | Status: DC

## 2022-04-15 MED ORDER — SENNOSIDES-DOCUSATE SODIUM 8.6-50 MG PO TABS
ORAL_TABLET | ORAL | Status: AC
  Filled 2022-04-15: qty 1

## 2022-04-15 MED ORDER — CEFAZOLIN SODIUM-DEXTROSE 2-4 GM/100ML-% IV SOLN
INTRAVENOUS | Status: AC
  Filled 2022-04-15: qty 100

## 2022-04-15 MED ORDER — MIDAZOLAM HCL 2 MG/2ML IJ SOLN
INTRAMUSCULAR | Status: AC
  Filled 2022-04-15: qty 2

## 2022-04-15 MED ORDER — ROCURONIUM BROMIDE 10 MG/ML (PF) SYRINGE
PREFILLED_SYRINGE | INTRAVENOUS | Status: AC
  Filled 2022-04-15: qty 10

## 2022-04-15 MED ORDER — FLEET ENEMA 7-19 GM/118ML RE ENEM: 1.0000 | ENEMA | Freq: Once | RECTAL | Status: DC | PRN

## 2022-04-15 MED ORDER — ORAL CARE MOUTH RINSE: 15.0000 mL | Freq: Once | OROMUCOSAL | Status: AC

## 2022-04-15 MED ORDER — PROPOFOL 1000 MG/100ML IV EMUL
INTRAVENOUS | Status: AC
  Filled 2022-04-15: qty 100

## 2022-04-15 MED ORDER — ONDANSETRON HCL 4 MG/2ML IJ SOLN
INTRAMUSCULAR | Status: DC | PRN
  Administered 2022-04-15: 4 mg via INTRAVENOUS

## 2022-04-15 MED ORDER — FENTANYL CITRATE (PF) 100 MCG/2ML IJ SOLN
INTRAMUSCULAR | Status: DC | PRN
  Administered 2022-04-15 (×3): 25 ug via INTRAVENOUS
  Administered 2022-04-15: 50 ug via INTRAVENOUS
  Administered 2022-04-15: 25 ug via INTRAVENOUS

## 2022-04-15 MED ORDER — FAMOTIDINE 20 MG PO TABS: 20.0000 mg | ORAL_TABLET | Freq: Once | ORAL | Status: AC

## 2022-04-15 MED ORDER — ALUM & MAG HYDROXIDE-SIMETH 200-200-20 MG/5ML PO SUSP: 30.0000 mL | ORAL | Status: DC | PRN

## 2022-04-15 MED ORDER — OXYCODONE HCL 5 MG/5ML PO SOLN: 5.0000 mg | Freq: Once | ORAL | Status: AC | PRN

## 2022-04-15 MED ORDER — STERILE WATER FOR IRRIGATION IR SOLN
Status: DC | PRN
  Administered 2022-04-15: 1000 mL

## 2022-04-15 MED ORDER — FENTANYL CITRATE (PF) 100 MCG/2ML IJ SOLN
INTRAMUSCULAR | Status: AC
  Filled 2022-04-15: qty 2

## 2022-04-15 MED ORDER — SENNOSIDES-DOCUSATE SODIUM 8.6-50 MG PO TABS
ORAL_TABLET | ORAL | Status: AC
  Administered 2022-04-15: 1 via ORAL
  Filled 2022-04-15: qty 1

## 2022-04-15 MED ORDER — ROCURONIUM BROMIDE 100 MG/10ML IV SOLN
INTRAVENOUS | Status: DC | PRN
  Administered 2022-04-15 (×2): 30 mg via INTRAVENOUS
  Administered 2022-04-15: 20 mg via INTRAVENOUS
  Administered 2022-04-15: 30 mg via INTRAVENOUS
  Administered 2022-04-15: 20 mg via INTRAVENOUS

## 2022-04-15 MED ORDER — SUCCINYLCHOLINE CHLORIDE 200 MG/10ML IV SOSY
PREFILLED_SYRINGE | INTRAVENOUS | Status: DC | PRN
  Administered 2022-04-15: 80 mg via INTRAVENOUS

## 2022-04-15 MED ORDER — GABAPENTIN 300 MG PO CAPS: 1200.0000 mg | ORAL_CAPSULE | Freq: Two times a day (BID) | ORAL | Status: DC

## 2022-04-15 MED ORDER — ONDANSETRON HCL 4 MG/2ML IJ SOLN
INTRAMUSCULAR | Status: AC
  Filled 2022-04-15: qty 2

## 2022-04-15 MED ORDER — AMLODIPINE BESYLATE 5 MG PO TABS
5.0000 mg | ORAL_TABLET | Freq: Every evening | ORAL | Status: DC
  Administered 2022-04-15: 5 mg via ORAL

## 2022-04-15 MED ORDER — HYDROMORPHONE HCL 1 MG/ML IJ SOLN
INTRAMUSCULAR | Status: AC
  Filled 2022-04-15: qty 1

## 2022-04-15 MED ORDER — MAGNESIUM HYDROXIDE 400 MG/5ML PO SUSP
ORAL | Status: AC
  Administered 2022-04-15: 30 mL via ORAL
  Filled 2022-04-15: qty 30

## 2022-04-15 MED ORDER — LISINOPRIL 20 MG PO TABS: 40.0000 mg | ORAL_TABLET | ORAL | Status: DC

## 2022-04-15 MED ORDER — LIDOCAINE HCL (CARDIAC) PF 100 MG/5ML IV SOSY
PREFILLED_SYRINGE | INTRAVENOUS | Status: DC | PRN
  Administered 2022-04-15: 60 mg via INTRAVENOUS

## 2022-04-15 MED ORDER — MIDAZOLAM HCL 5 MG/5ML IJ SOLN
INTRAMUSCULAR | Status: DC | PRN
  Administered 2022-04-15: 2 mg via INTRAVENOUS

## 2022-04-15 MED ORDER — TRANEXAMIC ACID-NACL 1000-0.7 MG/100ML-% IV SOLN
INTRAVENOUS | Status: AC
  Filled 2022-04-15: qty 100

## 2022-04-15 MED ORDER — CEFAZOLIN SODIUM-DEXTROSE 2-4 GM/100ML-% IV SOLN: 2.0000 g | Freq: Four times a day (QID) | INTRAVENOUS | Status: AC

## 2022-04-15 MED ORDER — OXYCODONE HCL 5 MG PO TABS
10.0000 mg | ORAL_TABLET | ORAL | Status: DC | PRN
  Administered 2022-04-16: 10 mg via ORAL

## 2022-04-15 MED ORDER — DULOXETINE HCL 60 MG PO CPEP
60.0000 mg | ORAL_CAPSULE | ORAL | Status: DC
  Administered 2022-04-16: 60 mg via ORAL
  Filled 2022-04-15: qty 1

## 2022-04-15 MED ORDER — CHLORHEXIDINE GLUCONATE 0.12 % MT SOLN: 15.0000 mL | Freq: Once | OROMUCOSAL | Status: AC

## 2022-04-15 MED ORDER — OXYCODONE HCL 5 MG PO TABS
ORAL_TABLET | ORAL | Status: AC
  Administered 2022-04-15: 10 mg via ORAL
  Filled 2022-04-15: qty 2

## 2022-04-15 MED ORDER — ACETAMINOPHEN 10 MG/ML IV SOLN
INTRAVENOUS | Status: AC
  Filled 2022-04-15: qty 100

## 2022-04-15 MED ORDER — PHENYLEPHRINE 80 MCG/ML (10ML) SYRINGE FOR IV PUSH (FOR BLOOD PRESSURE SUPPORT)
PREFILLED_SYRINGE | INTRAVENOUS | Status: AC
  Filled 2022-04-15: qty 10

## 2022-04-15 MED ORDER — LACTATED RINGERS IV SOLN: INTRAVENOUS | Status: DC

## 2022-04-15 MED ORDER — PANTOPRAZOLE SODIUM 40 MG PO TBEC
DELAYED_RELEASE_TABLET | ORAL | Status: AC
  Administered 2022-04-15: 40 mg via ORAL
  Filled 2022-04-15: qty 1

## 2022-04-15 MED ORDER — GABAPENTIN 300 MG PO CAPS
ORAL_CAPSULE | ORAL | Status: AC
  Administered 2022-04-15: 1200 mg via ORAL
  Filled 2022-04-15: qty 4

## 2022-04-15 MED ORDER — GABAPENTIN 300 MG PO CAPS
ORAL_CAPSULE | ORAL | Status: AC
  Administered 2022-04-15: 300 mg via ORAL
  Filled 2022-04-15: qty 1

## 2022-04-15 MED ORDER — ACETAMINOPHEN 325 MG PO TABS: 325.0000 mg | ORAL_TABLET | Freq: Four times a day (QID) | ORAL | Status: DC | PRN

## 2022-04-15 MED ORDER — CELECOXIB 200 MG PO CAPS
ORAL_CAPSULE | ORAL | Status: AC
  Administered 2022-04-15: 200 mg via ORAL
  Filled 2022-04-15: qty 1

## 2022-04-15 MED ORDER — ACETAMINOPHEN 10 MG/ML IV SOLN
INTRAVENOUS | Status: AC
  Administered 2022-04-15: 1000 mg via INTRAVENOUS
  Filled 2022-04-15: qty 100

## 2022-04-15 MED ORDER — DEXAMETHASONE SODIUM PHOSPHATE 10 MG/ML IJ SOLN
INTRAMUSCULAR | Status: DC | PRN
  Administered 2022-04-15: 10 mg via INTRAVENOUS

## 2022-04-15 MED ORDER — CELECOXIB 200 MG PO CAPS: 200.0000 mg | ORAL_CAPSULE | Freq: Two times a day (BID) | ORAL | Status: DC

## 2022-04-15 MED ORDER — AMLODIPINE BESYLATE 5 MG PO TABS
ORAL_TABLET | ORAL | Status: AC
  Filled 2022-04-15: qty 1

## 2022-04-15 MED ORDER — BISACODYL 10 MG RE SUPP: 10.0000 mg | Freq: Every day | RECTAL | Status: DC | PRN

## 2022-04-15 MED ORDER — DEXAMETHASONE SODIUM PHOSPHATE 10 MG/ML IJ SOLN
INTRAMUSCULAR | Status: AC
  Administered 2022-04-15: 8 mg via INTRAVENOUS
  Filled 2022-04-15: qty 1

## 2022-04-15 MED ORDER — EPHEDRINE SULFATE (PRESSORS) 50 MG/ML IJ SOLN
INTRAMUSCULAR | Status: DC | PRN
  Administered 2022-04-15 (×2): 5 mg via INTRAVENOUS

## 2022-04-15 MED ORDER — ENSURE PRE-SURGERY PO LIQD
296.0000 mL | Freq: Once | ORAL | Status: AC
  Administered 2022-04-15: 296 mL via ORAL
  Filled 2022-04-15: qty 296

## 2022-04-15 MED ORDER — KETAMINE HCL 10 MG/ML IJ SOLN
INTRAMUSCULAR | Status: DC | PRN
  Administered 2022-04-15: 25 mg via INTRAVENOUS
  Administered 2022-04-15: 10 mg via INTRAVENOUS

## 2022-04-15 MED ORDER — PANTOPRAZOLE SODIUM 40 MG PO TBEC: 40.0000 mg | DELAYED_RELEASE_TABLET | Freq: Two times a day (BID) | ORAL | Status: DC

## 2022-04-15 MED ORDER — ORAL CARE MOUTH RINSE: 15.0000 mL | OROMUCOSAL | Status: DC | PRN

## 2022-04-15 MED ORDER — CELECOXIB 200 MG PO CAPS
400.0000 mg | ORAL_CAPSULE | Freq: Once | ORAL | Status: AC
  Administered 2022-04-15: 400 mg via ORAL

## 2022-04-15 MED ORDER — ENOXAPARIN SODIUM 30 MG/0.3ML IJ SOSY: 30.0000 mg | PREFILLED_SYRINGE | Freq: Two times a day (BID) | INTRAMUSCULAR | Status: DC

## 2022-04-15 MED ORDER — HYDROMORPHONE HCL 1 MG/ML IJ SOLN
0.5000 mg | INTRAMUSCULAR | Status: DC | PRN
  Administered 2022-04-15 (×2): 1 mg via INTRAVENOUS

## 2022-04-15 MED ORDER — PHENYLEPHRINE HCL-NACL 20-0.9 MG/250ML-% IV SOLN
INTRAVENOUS | Status: AC
  Filled 2022-04-15: qty 250

## 2022-04-15 MED ORDER — SEVOFLURANE IN SOLN
RESPIRATORY_TRACT | Status: AC
  Filled 2022-04-15: qty 250

## 2022-04-15 MED ORDER — FERROUS SULFATE 325 (65 FE) MG PO TABS
325.0000 mg | ORAL_TABLET | Freq: Two times a day (BID) | ORAL | Status: DC
  Administered 2022-04-15: 325 mg via ORAL

## 2022-04-15 MED ORDER — 0.9 % SODIUM CHLORIDE (POUR BTL) OPTIME
TOPICAL | Status: DC | PRN
  Administered 2022-04-15: 1000 mL

## 2022-04-15 MED ORDER — FERROUS SULFATE 325 (65 FE) MG PO TABS
ORAL_TABLET | ORAL | Status: AC
  Filled 2022-04-15: qty 1

## 2022-04-15 MED ORDER — SUGAMMADEX SODIUM 200 MG/2ML IV SOLN
INTRAVENOUS | Status: DC | PRN
  Administered 2022-04-15: 145.2 mg via INTRAVENOUS

## 2022-04-15 MED ORDER — HYDROMORPHONE HCL 1 MG/ML IJ SOLN
0.2500 mg | INTRAMUSCULAR | Status: DC | PRN
  Administered 2022-04-15 (×4): 0.5 mg via INTRAVENOUS

## 2022-04-15 MED ORDER — SURGIPHOR WOUND IRRIGATION SYSTEM - OPTIME: TOPICAL | Status: DC | PRN

## 2022-04-15 MED ORDER — PROPOFOL 10 MG/ML IV BOLUS
INTRAVENOUS | Status: DC | PRN
  Administered 2022-04-15: 140 mg via INTRAVENOUS

## 2022-04-15 MED ORDER — CHLORHEXIDINE GLUCONATE 0.12 % MT SOLN
OROMUCOSAL | Status: AC
  Administered 2022-04-15: 15 mL via OROMUCOSAL
  Filled 2022-04-15: qty 15

## 2022-04-15 MED ORDER — CELECOXIB 200 MG PO CAPS
ORAL_CAPSULE | ORAL | Status: AC
  Filled 2022-04-15: qty 2

## 2022-04-15 MED ORDER — ACETAMINOPHEN 10 MG/ML IV SOLN
INTRAVENOUS | Status: DC | PRN
  Administered 2022-04-15: 1000 mg via INTRAVENOUS

## 2022-04-15 MED ORDER — OXYCODONE HCL 5 MG PO TABS
ORAL_TABLET | ORAL | Status: AC
  Filled 2022-04-15: qty 1

## 2022-04-15 MED ORDER — HYDROMORPHONE HCL 1 MG/ML IJ SOLN
INTRAMUSCULAR | Status: DC | PRN
  Administered 2022-04-15: .25 mg via INTRAVENOUS
  Administered 2022-04-15: .75 mg via INTRAVENOUS

## 2022-04-15 MED ORDER — ONDANSETRON HCL 4 MG PO TABS: 4.0000 mg | ORAL_TABLET | Freq: Four times a day (QID) | ORAL | Status: DC | PRN

## 2022-04-15 MED ORDER — PHENOL 1.4 % MT LIQD: 1.0000 | OROMUCOSAL | Status: DC | PRN

## 2022-04-15 MED ORDER — ONDANSETRON HCL 4 MG/2ML IJ SOLN: 4.0000 mg | Freq: Four times a day (QID) | INTRAMUSCULAR | Status: DC | PRN

## 2022-04-15 MED ORDER — PHENYLEPHRINE HCL-NACL 20-0.9 MG/250ML-% IV SOLN
INTRAVENOUS | Status: DC | PRN
  Administered 2022-04-15: 20 ug/min via INTRAVENOUS

## 2022-04-15 MED ORDER — METOCLOPRAMIDE HCL 10 MG PO TABS
10.0000 mg | ORAL_TABLET | Freq: Three times a day (TID) | ORAL | Status: DC
  Administered 2022-04-15: 10 mg via ORAL

## 2022-04-15 MED ORDER — CARVEDILOL 12.5 MG PO TABS
ORAL_TABLET | ORAL | Status: AC
  Administered 2022-04-15: 6.25 mg via ORAL
  Filled 2022-04-15: qty 1

## 2022-04-15 MED ORDER — MENTHOL 3 MG MT LOZG: 1.0000 | LOZENGE | OROMUCOSAL | Status: DC | PRN

## 2022-04-15 MED ORDER — OXYCODONE HCL 5 MG PO TABS: 5.0000 mg | ORAL_TABLET | ORAL | Status: DC | PRN

## 2022-04-15 MED ORDER — SODIUM CHLORIDE 0.9 % IR SOLN
Status: DC | PRN
  Administered 2022-04-15: 3000 mL

## 2022-04-15 MED ORDER — TRAZODONE HCL 50 MG PO TABS
50.0000 mg | ORAL_TABLET | Freq: Every day | ORAL | Status: DC
  Administered 2022-04-15: 50 mg via ORAL
  Filled 2022-04-15: qty 1

## 2022-04-15 MED ORDER — DIPHENHYDRAMINE HCL 12.5 MG/5ML PO ELIX: 12.5000 mg | ORAL_SOLUTION | ORAL | Status: DC | PRN

## 2022-04-15 MED ORDER — KETAMINE HCL 50 MG/5ML IJ SOSY
PREFILLED_SYRINGE | INTRAMUSCULAR | Status: AC
  Filled 2022-04-15: qty 5

## 2022-04-15 MED ORDER — CHLORHEXIDINE GLUCONATE 4 % EX LIQD: 60.0000 mL | Freq: Once | CUTANEOUS | Status: DC

## 2022-04-15 MED ORDER — OXYCODONE HCL 5 MG PO TABS
ORAL_TABLET | ORAL | Status: AC
  Administered 2022-04-15: 5 mg via ORAL
  Filled 2022-04-15: qty 1

## 2022-04-15 MED ORDER — DEXAMETHASONE SODIUM PHOSPHATE 10 MG/ML IJ SOLN: 8.0000 mg | Freq: Once | INTRAMUSCULAR | Status: AC

## 2022-04-15 MED ORDER — GABAPENTIN 300 MG PO CAPS: 300.0000 mg | ORAL_CAPSULE | Freq: Once | ORAL | Status: AC

## 2022-04-15 MED ORDER — PROPOFOL 10 MG/ML IV BOLUS
INTRAVENOUS | Status: AC
  Filled 2022-04-15: qty 20

## 2022-04-15 MED ORDER — TRANEXAMIC ACID-NACL 1000-0.7 MG/100ML-% IV SOLN
1000.0000 mg | Freq: Once | INTRAVENOUS | Status: AC
  Administered 2022-04-15: 1000 mg via INTRAVENOUS

## 2022-04-15 MED ORDER — IRRISEPT - 450ML BOTTLE WITH 0.05% CHG IN STERILE WATER, USP 99.95% OPTIME
TOPICAL | Status: DC | PRN
  Administered 2022-04-15: 450 mL

## 2022-04-15 MED ORDER — SENNOSIDES-DOCUSATE SODIUM 8.6-50 MG PO TABS: 1.0000 | ORAL_TABLET | Freq: Two times a day (BID) | ORAL | Status: DC

## 2022-04-15 MED ORDER — PHENYLEPHRINE 80 MCG/ML (10ML) SYRINGE FOR IV PUSH (FOR BLOOD PRESSURE SUPPORT)
PREFILLED_SYRINGE | INTRAVENOUS | Status: DC | PRN
  Administered 2022-04-15 (×3): 80 ug via INTRAVENOUS
  Administered 2022-04-15 (×2): 160 ug via INTRAVENOUS
  Administered 2022-04-15: 40 ug via INTRAVENOUS
  Administered 2022-04-15 (×2): 80 ug via INTRAVENOUS
  Administered 2022-04-15: 40 ug via INTRAVENOUS
  Administered 2022-04-15: 80 ug via INTRAVENOUS

## 2022-04-15 MED ORDER — TRAMADOL HCL 50 MG PO TABS
50.0000 mg | ORAL_TABLET | ORAL | Status: DC | PRN
  Administered 2022-04-15: 100 mg via ORAL

## 2022-04-15 MED ORDER — SODIUM CHLORIDE 0.9 % IV SOLN: INTRAVENOUS | Status: DC

## 2022-04-15 MED ORDER — FAMOTIDINE 20 MG PO TABS
ORAL_TABLET | ORAL | Status: AC
  Administered 2022-04-15: 20 mg via ORAL
  Filled 2022-04-15: qty 1

## 2022-04-15 MED ORDER — TRANEXAMIC ACID-NACL 1000-0.7 MG/100ML-% IV SOLN
1000.0000 mg | INTRAVENOUS | Status: AC
  Administered 2022-04-15: 1000 mg via INTRAVENOUS

## 2022-04-15 MED ORDER — TRAMADOL HCL 50 MG PO TABS
ORAL_TABLET | ORAL | Status: AC
  Filled 2022-04-15: qty 2

## 2022-04-15 SURGICAL SUPPLY — 57 items
BLADE SAW 90X25X1.19 OSCILLAT (BLADE) ×1 IMPLANT
CUP ACETBLR 54 OD 100 SERIES (Hips) IMPLANT
DRAPE 3/4 80X56 (DRAPES) ×1 IMPLANT
DRAPE INCISE IOBAN 66X60 STRL (DRAPES) ×1 IMPLANT
DRSG MEPILEX SACRM 8.7X9.8 (GAUZE/BANDAGES/DRESSINGS) ×1 IMPLANT
DRSG NON-ADHERENT DERMACEA 3X4 (GAUZE/BANDAGES/DRESSINGS) ×1 IMPLANT
DRSG OPSITE POSTOP 4X12 (GAUZE/BANDAGES/DRESSINGS) ×1 IMPLANT
DRSG OPSITE POSTOP 4X14 (GAUZE/BANDAGES/DRESSINGS) IMPLANT
DRSG TEGADERM 4X4.75 (GAUZE/BANDAGES/DRESSINGS) ×1 IMPLANT
DURAPREP 26ML APPLICATOR (WOUND CARE) ×2 IMPLANT
ELECT CAUTERY BLADE 6.4 (BLADE) ×1 IMPLANT
ELECT REM PT RETURN 9FT ADLT (ELECTROSURGICAL) ×1
ELECTRODE REM PT RTRN 9FT ADLT (ELECTROSURGICAL) ×1 IMPLANT
GLOVE BIO SURGEON STRL SZ7.5 (GLOVE) ×2 IMPLANT
GLOVE BIOGEL M STRL SZ7.5 (GLOVE) ×2 IMPLANT
GLOVE SRG 8 PF TXTR STRL LF DI (GLOVE) ×1 IMPLANT
GLOVE SURG UNDER POLY LF SZ8 (GLOVE) ×1
GOWN STRL REUS W/ TWL LRG LVL3 (GOWN DISPOSABLE) ×2 IMPLANT
GOWN STRL REUS W/TWL LRG LVL3 (GOWN DISPOSABLE) ×2
GOWN TOGA ZIPPER T7+ PEEL AWAY (MISCELLANEOUS) ×1 IMPLANT
HEAD M SROM 36MM PLUS 1.5 (Hips) IMPLANT
HEMOVAC 400CC 10FR (MISCELLANEOUS) ×1 IMPLANT
HOLDER FOLEY CATH W/STRAP (MISCELLANEOUS) ×1 IMPLANT
HOOD PEEL AWAY T7 (MISCELLANEOUS) ×1 IMPLANT
IV NS IRRIG 3000ML ARTHROMATIC (IV SOLUTION) ×1 IMPLANT
JET LAVAGE IRRISEPT WOUND (IRRIGATION / IRRIGATOR) ×1
KIT PEG BOARD PINK (KITS) ×1 IMPLANT
KIT TURNOVER KIT A (KITS) ×1 IMPLANT
LAVAGE JET IRRISEPT WOUND (IRRIGATION / IRRIGATOR) IMPLANT
LINER MARATHON 10D 36X54 (Hips) IMPLANT
LINER MARATHON 10DEG 36X54 (Hips) ×1 IMPLANT
MANIFOLD NEPTUNE II (INSTRUMENTS) ×2 IMPLANT
NDL SAFETY ECLIP 18X1.5 (MISCELLANEOUS) ×1 IMPLANT
NS IRRIG 1000ML POUR BTL (IV SOLUTION) ×1 IMPLANT
PACK HIP PROSTHESIS (MISCELLANEOUS) ×1 IMPLANT
PIN STEIN THRED 5/32 (Pin) IMPLANT
PULSAVAC PLUS IRRIG FAN TIP (DISPOSABLE) ×1
SOL PREP PVP 2OZ (MISCELLANEOUS) ×1
SOLUTION IRRIG SURGIPHOR (IV SOLUTION) ×1 IMPLANT
SOLUTION PREP PVP 2OZ (MISCELLANEOUS) ×1 IMPLANT
SPONGE DRAIN TRACH 4X4 STRL 2S (GAUZE/BANDAGES/DRESSINGS) ×1 IMPLANT
SROM M HEAD 36MM PLUS 1.5 (Hips) ×1 IMPLANT
STAPLER SKIN PROX 35W (STAPLE) ×1 IMPLANT
STEM FEMORAL SZ 6MM STD ACTIS (Stem) IMPLANT
SUT ETHIBOND #5 BRAIDED 30INL (SUTURE) ×1 IMPLANT
SUT VIC AB 0 CT1 36 (SUTURE) ×1 IMPLANT
SUT VIC AB 1 CT1 36 (SUTURE) ×2 IMPLANT
SUT VIC AB 2-0 CT1 27 (SUTURE) ×1
SUT VIC AB 2-0 CT1 TAPERPNT 27 (SUTURE) ×1 IMPLANT
SYR 20ML LL LF (SYRINGE) ×1 IMPLANT
TAPE CLOTH 3X10 WHT NS LF (GAUZE/BANDAGES/DRESSINGS) ×1 IMPLANT
TAPE TRANSPORE STRL 2 31045 (GAUZE/BANDAGES/DRESSINGS) ×1 IMPLANT
TIP FAN IRRIG PULSAVAC PLUS (DISPOSABLE) ×1 IMPLANT
TOWEL OR 17X26 4PK STRL BLUE (TOWEL DISPOSABLE) IMPLANT
TRAP FLUID SMOKE EVACUATOR (MISCELLANEOUS) ×1 IMPLANT
TRAY FOLEY MTR SLVR 16FR STAT (SET/KITS/TRAYS/PACK) ×1 IMPLANT
WATER STERILE IRR 1000ML POUR (IV SOLUTION) ×1 IMPLANT

## 2022-04-15 NOTE — Transfer of Care (Signed)
Immediate Anesthesia Transfer of Care Note  Patient: Lesbia Ottaway  Procedure(s) Performed: TOTAL HIP ARTHROPLASTY (Left: Hip)  Patient Location: PACU  Anesthesia Type:General  Level of Consciousness: awake, alert , and oriented  Airway & Oxygen Therapy: Patient Spontanous Breathing and Patient connected to face mask oxygen  Post-op Assessment: Report given to RN and Post -op Vital signs reviewed and stable  Post vital signs: Reviewed and stable  Last Vitals:  Vitals Value Taken Time  BP 114/58 04/15/22 1100  Temp    Pulse 104 04/15/22 1102  Resp 18 04/15/22 1102  SpO2 100 % 04/15/22 1102  Vitals shown include unvalidated device data.  Last Pain:  Vitals:   04/15/22 0627  TempSrc: Oral  PainSc: 8          Complications: No notable events documented.

## 2022-04-15 NOTE — Anesthesia Preprocedure Evaluation (Addendum)
Anesthesia Evaluation  Patient identified by MRN, date of birth, ID band Patient awake    Reviewed: Allergy & Precautions, NPO status , Patient's Chart, lab work & pertinent test results  History of Anesthesia Complications Negative for: history of anesthetic complications  Airway Mallampati: II  TM Distance: >3 FB Neck ROM: full    Dental   Permanent implants upper and lower :   Pulmonary neg pulmonary ROS   Pulmonary exam normal        Cardiovascular Exercise Tolerance: Poor hypertension, On Medications and On Home Beta Blockers Normal cardiovascular exam     Neuro/Psych  PSYCHIATRIC DISORDERS  Depression     Neuromuscular disease    GI/Hepatic Neg liver ROS, hiatal hernia,GERD  Medicated and Controlled,,  Endo/Other  negative endocrine ROS    Renal/GU      Musculoskeletal  (+) Arthritis ,  Fibromyalgia -, narcotic dependentScoliosis    Abdominal   Peds  Hematology negative hematology ROS (+)   Anesthesia Other Findings Past Medical History: No date: Abnormal EKG No date: Anemia No date: Arthritis No date: Depression No date: Fibromyalgia No date: GERD (gastroesophageal reflux disease) No date: History of hiatal hernia No date: Hypertension No date: Scoliosis     Comment:  had trouble getting spinal in for TKA in 2022 No date: Tachycardia  Past Surgical History: No date: CESAREAN SECTION     Comment:  x 4 No date: COSMETIC SURGERY     Comment:  lip injury No date: FRACTURE SURGERY; Right     Comment:  foot ORIF-age 72 No date: HERNIA REPAIR; Left     Comment:  inguinal 01/27/2021: KNEE ARTHROPLASTY; Right     Comment:  Procedure: COMPUTER ASSISTED TOTAL KNEE ARTHROPLASTY;                Surgeon: Dereck Leep, MD;  Location: [facility] ORS;                Service: Orthopedics;  Laterality: Right; No date: MOUTH SURGERY     Comment:  implants No date: TONSILLECTOMY  BMI    Body Mass Index: 27.46  kg/m      Reproductive/Obstetrics negative OB ROS                             Anesthesia Physical Anesthesia Plan  ASA: 2  Anesthesia Plan: Spinal   Post-op Pain Management: Gabapentin PO (pre-op)* and Celebrex PO (pre-op)*   Induction: Intravenous  PONV Risk Score and Plan: 2 and Ondansetron, Dexamethasone and Treatment may vary due to age or medical condition  Airway Management Planned: Natural Airway and Nasal Cannula  Additional Equipment:   Intra-op Plan:   Post-operative Plan:   Informed Consent: I have reviewed the patients History and Physical, chart, labs and discussed the procedure including the risks, benefits and alternatives for the proposed anesthesia with the patient or authorized representative who has indicated his/her understanding and acceptance.     Dental Advisory Given  Plan Discussed with: Anesthesiologist, CRNA and Surgeon  Anesthesia Plan Comments: (Patient reports no bleeding problems and no anticoagulant use.  Plan for spinal with backup GA  Patient consented for risks of anesthesia including but not limited to:  - adverse reactions to medications - damage to eyes, teeth, lips or other oral mucosa - nerve damage due to positioning  - risk of bleeding, infection and or nerve damage from spinal that could lead to paralysis - risk of  headache or failed spinal - damage to teeth, lips or other oral mucosa - sore throat or hoarseness - damage to heart, brain, nerves, lungs, other parts of body or loss of life  Patient voiced understanding.)       Anesthesia Quick Evaluation

## 2022-04-15 NOTE — TOC Progression Note (Addendum)
Transition of Care [facility]) - Progression Note    Patient Details  Name: Gina Matthews MRN: [id number] Date of Birth: 1951/03/04  Transition of Care [facility]) CM/SW [place], RN Phone Number: 04/15/2022, 4:12 PM  Clinical Narrative:    The patient is set up with [place] for [facility] services prior to Surgery by surgeons office DME she has at home per PT is  [employer] (2 wheels);Cane - single point;Shower seat;Grab bars - tub/shower She is a  retired Marine scientist  She reports to the bedside nurse that she has a RW and a 3 in 1        Expected Discharge Plan and Services                                               Social Determinants of Health (SDOH) Interventions SDOH Screenings   Food Insecurity: No Food Insecurity (04/15/2022)  Housing: Low Risk  (04/15/2022)  Transportation Needs: No Transportation Needs (04/15/2022)  Utilities: Not At Risk (04/15/2022)  Tobacco Use: Low Risk  (04/15/2022)    Readmission Risk Interventions     No data to display

## 2022-04-15 NOTE — Anesthesia Procedure Notes (Signed)
Procedure Name: Intubation Date/Time: 04/15/2022 7:52 AM  Performed by: Demetrius Charity, CRNAPre-anesthesia Checklist: Patient identified, Patient being monitored, Timeout performed, Emergency Drugs available and Suction available Patient Re-evaluated:Patient Re-evaluated prior to induction Oxygen Delivery Method: Circle system utilized Preoxygenation: Pre-oxygenation with 100% oxygen Induction Type: IV induction Ventilation: Mask ventilation without difficulty Laryngoscope Size: McGraph and 4 Grade View: Grade III Tube type: Oral Tube size: 7.0 mm Number of attempts: 1 Airway Equipment and Method: Bougie stylet and Video-laryngoscopy Placement Confirmation: ETT inserted through vocal cords under direct vision, positive ETCO2 and breath sounds checked- equal and bilateral Secured at: 22 cm Tube secured with: Tape Dental Injury: Teeth and Oropharynx as per pre-operative assessment  Difficulty Due To: Difficult Airway- due to anterior larynx and Difficult Airway- due to reduced neck mobility Future Recommendations: Recommend- induction with short-acting agent, and alternative techniques readily available Comments: DL x 2 as pt had anterior, deep airway.  Able to get view of arytenoids and use a drivable bougie to place ETT.  BBS, ETCO2 noted.  O2 sats maintained throughout attempts.

## 2022-04-15 NOTE — Anesthesia Postprocedure Evaluation (Signed)
Anesthesia Post Note  Patient: Gina Matthews  Procedure(s) Performed: TOTAL HIP ARTHROPLASTY (Left: Hip)  Patient location during evaluation: PACU Anesthesia Type: Spinal Level of consciousness: awake and alert Pain management: pain level controlled Vital Signs Assessment: post-procedure vital signs reviewed and stable Respiratory status: spontaneous breathing and respiratory function stable Cardiovascular status: blood pressure returned to baseline and stable Postop Assessment: spinal receding Anesthetic complications: no   No notable events documented.   Last Vitals:  Vitals:   04/15/22 1115 04/15/22 1130  BP: 132/71 123/69  Pulse: 100 99  Resp: 15 14  Temp:    SpO2: 100% 99%    Last Pain:  Vitals:   04/15/22 1130  TempSrc:   PainSc: 10-Worst pain ever                 [name]

## 2022-04-15 NOTE — Op Note (Signed)
OPERATIVE NOTE  DATE OF SURGERY:  04/15/2022  PATIENT NAME:  Gina Matthews   DOB: 12/30/1950  MRN: [id number]  PRE-OPERATIVE DIAGNOSIS: Degenerative arthrosis of the left hip, primary  POST-OPERATIVE DIAGNOSIS:  Same  PROCEDURE:  Left total hip arthroplasty  SURGEON:  Marciano Sequin. M.D.  ANESTHESIA: general  ESTIMATED BLOOD LOSS: 150 mL  FLUIDS REPLACED: 1500 mL of crystalloid  DRAINS: 2 medium Hemovac drains  IMPLANTS UTILIZED: DePuy size 6 standard offset Actis femoral stem, 54 mm OD Pinnacle 100 acetabular component, +4 mm 10 degree Pinnacle Marathon polyethylene insert, and a 36 mm M-SPEC +1.5 mm hip ball  INDICATIONS FOR SURGERY: Gina Matthews is a 72 y.o. year old female with a long history of progressive hip and groin  pain. X-rays demonstrated severe degenerative changes. The patient had not seen any significant improvement despite conservative nonsurgical intervention. After discussion of the risks and benefits of surgical intervention, the patient expressed understanding of the risks benefits and agree with plans for total hip arthroplasty.   The risks, benefits, and alternatives were discussed at length including but not limited to the risks of infection, bleeding, nerve injury, stiffness, blood clots, the need for revision surgery, limb length inequality, dislocation, cardiopulmonary complications, among others, and they were willing to proceed.  PROCEDURE IN DETAIL: The patient was brought into the operating room and, after adequate general anesthesia was achieved, the patient was placed in a right lateral decubitus position. Axillary roll was placed and all bony prominences were well-padded. The patient's left hip was cleaned and prepped with alcohol and DuraPrep and draped in the usual sterile fashion. A "timeout" was performed as per usual protocol. A lateral curvilinear incision was made gently curving towards the posterior superior iliac spine. The IT band was  incised in line with the skin incision and the fibers of the gluteus maximus were split in line. The piriformis tendon was identified, skeletonized, and incised at its insertion to the proximal femur and reflected posteriorly. A T type posterior capsulotomy was performed. Prior to dislocation of the femoral head, a threaded Steinmann pin was inserted through a separate stab incision into the pelvis superior to the acetabulum and bent in the form of a stylus so as to assess limb length and hip offset throughout the procedure. The femoral head was then dislocated posteriorly. Inspection of the femoral head demonstrated severe degenerative changes with full-thickness loss of articular cartilage. The femoral neck cut was performed using an oscillating saw. The anterior capsule was elevated off of the femoral neck using a periosteal elevator. Attention was then directed to the acetabulum. The remnant of the labrum was excised using electrocautery. Inspection of the acetabulum also demonstrated significant degenerative changes. The acetabulum was reamed in sequential fashion up to a 53 mm diameter. Good punctate bleeding bone was encountered. A 54 mm Pinnacle 100 acetabular component was positioned and impacted into place. Good scratch fit was appreciated. A neutral polyethylene trial was inserted.  Attention was then directed to the proximal femur.  Femoral broaches were inserted in a sequential fashion up to a size 6 broach. Calcar region was planed and a trial reduction was performed using a standard offset neck and a 36 mm hip ball with a +1.5 mm neck length.  Reasonably good stability was appreciated but it was elected to trial with a +4 mm 10 degree trial polyethylene with the high side at the 4 o'clock position.  Good equalization of limb lengths and hip offset was appreciated and excellent  stability was noted both anteriorly and posteriorly. Trial components were removed. The acetabular shell was irrigated with  copious amounts of normal saline with antibiotic solution and suctioned dry. A +4 mm 10 degree Pinnacle Marathon polyethylene insert was positioned with the high side at the 10 o'clock position and impacted into place. Next, a size 6 standard offset Actis femoral stem was positioned and impacted into place. Excellent scratch fit was appreciated. A trial reduction was again performed with a 36 mm hip ball with a +1.5 mm neck length. Again, good equalization of limb lengths was appreciated and excellent stability appreciated both anteriorly and posteriorly. The hip was then dislocated and the trial hip ball was removed. The Morse taper was cleaned and dried. A 36 mm M-SPEC hip ball with a +1.5 mm neck length was placed on the trunnion and impacted into place. The hip was then reduced and placed through range of motion. Excellent stability was appreciated both anteriorly and posteriorly.  The wound was irrigated with copious amounts of normal saline followed by 450 ml of Surgiphor and suctioned dry. Good hemostasis was appreciated. The posterior capsulotomy was repaired using #5 Ethibond. Piriformis tendon was reapproximated to the undersurface of the gluteus medius tendon using #5 Ethibond. The IT band was reapproximated using interrupted sutures of #1 Vicryl. Subcutaneous tissue was approximated using first #0 Vicryl followed by #2-0 Vicryl. The skin was closed with skin staples.  The patient tolerated the procedure well and was transported to the recovery room in stable condition.   Marciano Sequin., M.D.

## 2022-04-15 NOTE — Interval H&P Note (Signed)
History and Physical Interval Note:  04/15/2022 11:00 AM  Gina Matthews  has presented today for surgery, with the diagnosis of PRIMARY OSTEOARTHRITIS OF LEFT HIP.Marland Kitchen  The various methods of treatment have been discussed with the patient and family. After consideration of risks, benefits and other options for treatment, the patient has consented to  Procedure(s): TOTAL HIP ARTHROPLASTY (Left) as a surgical intervention.  The patient's history has been reviewed, patient examined, no change in status, stable for surgery.  I have reviewed the patient's chart and labs.  Questions were answered to the patient's satisfaction.     [place]

## 2022-04-15 NOTE — Evaluation (Addendum)
Physical Therapy Evaluation Patient Details Name: Gina Matthews MRN: [id number] DOB: December 14, 1950 Today's Date: 04/15/2022  History of Present Illness  Patient is a 72 year old with degenerative arthrosis of left hip, s/p left total hip arthroplasty. History of fibromyalgia, scoliosis  Clinical Impression  Patient is agreeable to PT evaluation. She has been using a rolling walker at home due to left hip pain with ambulation. She lives alone but plans to have a friend stay with her for several days once at home to assist as needed.  Today, the patient was able to sit up and stand with minimal assistance. She ambulated a few feet with the rolling walker with flexed posture and decreased step length, steadying assistance provided. Reviewed hip precautions and tips to maintain during functional mobility.  Activity tolerance limited by pain and fatigue with activity. The patient is motivated to return home tomorrow if possible. PT will continue to follow to maximize independence.      Recommendations for follow up therapy are one component of a multi-disciplinary discharge planning process, led by the attending physician.  Recommendations may be updated based on patient status, additional functional criteria and insurance authorization.  Follow Up Recommendations Home health PT      Assistance Recommended at Discharge Intermittent Supervision/Assistance  Patient can return home with the following  A little help with walking and/or transfers;A little help with bathing/dressing/bathroom;Assist for transportation;Help with stairs or ramp for entrance;Assistance with cooking/housework    Equipment Recommendations None recommended by PT  Recommendations for Other Services       Functional Status Assessment Patient has had a recent decline in their functional status and demonstrates the ability to make significant improvements in function in a reasonable and predictable amount of time.      Precautions / Restrictions Precautions Precautions: Posterior Hip;Fall Precaution Booklet Issued: Yes (comment) Restrictions Weight Bearing Restrictions: Yes LLE Weight Bearing: Weight bearing as tolerated      Mobility  Bed Mobility Overal bed mobility: Needs Assistance Bed Mobility: Supine to Sit, Sit to Supine     Supine to sit: Min assist Sit to supine: Min assist   General bed mobility comments: assistance mostly for LLE support. verbal cues for techique to maintain hip precuations with activity    Transfers Overall transfer level: Needs assistance Equipment used: Rolling walker (2 wheels) Transfers: Sit to/from Stand Sit to Stand: Min assist           General transfer comment: verbal cues for LLE positioning and hand placement    Ambulation/Gait Ambulation/Gait assistance: Min assist Gait Distance (Feet): 5 Feet Assistive device: Rolling walker (2 wheels) Gait Pattern/deviations: Step-to pattern, Decreased stride length, Trunk flexed Gait velocity: decreased     General Gait Details: verbal cues for technique and using rolling walker for support in standing.  Stairs            Wheelchair Mobility    Modified Rankin (Stroke Patients Only)       Balance Overall balance assessment: Needs assistance Sitting-balance support: Feet supported Sitting balance-Leahy Scale: Fair     Standing balance support: Bilateral upper extremity supported, Reliant on assistive device for balance Standing balance-Leahy Scale: Poor Standing balance comment: external support required from rolling walker to maintain standing balance                             Pertinent Vitals/Pain Pain Assessment Pain Assessment: Faces Faces Pain Scale: Hurts even more Pain Location:  L hip Pain Descriptors / Indicators: Discomfort Pain Intervention(s): Limited activity within patient's tolerance, Monitored during session, Repositioned, Ice applied    Home Living  Family/patient expects to be discharged to:: Private residence Living Arrangements: Alone Available Help at Discharge: Friend(s) (available for 3-4 days post-op) Type of Home: House Home Access: Stairs to enter Entrance Stairs-Rails: Right Entrance Stairs-Number of Steps: 4   Home Layout: One level (bonus room with no essential needs) Home Equipment: Conservation officer, nature (2 wheels);Cane - single point;Shower seat;Grab bars - tub/shower Additional Comments: retired Hotel manager Prior Level of Function : Independent/Modified Independent             Mobility Comments: using rolling walker for ambulation       Hand Dominance        Extremity/Trunk Assessment   Upper Extremity Assessment Upper Extremity Assessment: Overall WFL for tasks assessed    Lower Extremity Assessment Lower Extremity Assessment: LLE deficits/detail (RLE WNL) LLE Deficits / Details: patient able to activate hip, knee, ankle movement. pain with AROM of L hip LLE Sensation: WNL       Communication   Communication: No difficulties  Cognition Arousal/Alertness: Awake/alert Behavior During Therapy: WFL for tasks assessed/performed Overall Cognitive Status: Within Functional Limits for tasks assessed                                          General Comments General comments (skin integrity, edema, etc.): handout provided and patient educated on posterior hip precuations and tips to maintain during functional mobility. patient needed to urinate as soon as she was back to bed. she voided on the bed pan but recommend to use bed side commode or toilet for future bathroom needs.     Exercises Total Joint Exercises Ankle Circles/Pumps: AROM, Strengthening, 10 reps, Supine, Both Heel Slides: AAROM, Strengthening, Left, 10 reps, Supine Hip ABduction/ADduction: AROM, Strengthening, Left, 10 reps, Supine Other Exercises Other Exercises: verbal cues for exercise technique    Assessment/Plan    PT Assessment Patient needs continued PT services  PT Problem List Decreased strength;Decreased range of motion;Decreased activity tolerance;Decreased balance;Decreased mobility;Pain;Decreased knowledge of precautions;Decreased safety awareness       PT Treatment Interventions DME instruction;Gait training;Stair training;Functional mobility training;Therapeutic activities;Therapeutic exercise;Balance training;Neuromuscular re-education    PT Goals (Current goals can be found in the Care Plan section)  Acute Rehab PT Goals Patient Stated Goal: to return home PT Goal Formulation: With patient Time For Goal Achievement: 04/29/22 Potential to Achieve Goals: Good    Frequency BID     Co-evaluation               AM-PAC PT "6 Clicks" Mobility  Outcome Measure Help needed turning from your back to your side while in a flat bed without using bedrails?: A Little Help needed moving from lying on your back to sitting on the side of a flat bed without using bedrails?: A Little Help needed moving to and from a bed to a chair (including a wheelchair)?: A Little Help needed standing up from a chair using your arms (e.g., wheelchair or bedside chair)?: A Little Help needed to walk in hospital room?: A Little Help needed climbing 3-5 steps with a railing? : A Lot 6 Click Score: 17    End of Session   Activity Tolerance: Patient tolerated treatment well Patient left: in bed;with call bell/phone within  reach;with bed alarm set;with SCD's reapplied Nurse Communication: Mobility status PT Visit Diagnosis: Difficulty in walking, not elsewhere classified (R26.2);Other abnormalities of gait and mobility (R26.89)    Time: 1450-1526 PT Time Calculation (min) (ACUTE ONLY): 36 min   Charges:   PT Evaluation $PT Eval Low Complexity: 1 Low PT Treatments $Therapeutic Exercise: 8-22 mins        Minna Merritts, PT, MPT  Percell Locus 04/15/2022, 3:55 PM

## 2022-04-16 ENCOUNTER — Encounter: Payer: Self-pay | Admitting: Orthopedic Surgery

## 2022-04-16 DIAGNOSIS — M1612 Unilateral primary osteoarthritis, left hip: Secondary | ICD-10-CM | POA: Diagnosis not present

## 2022-04-16 LAB — SURGICAL PATHOLOGY

## 2022-04-16 MED ORDER — SENNOSIDES-DOCUSATE SODIUM 8.6-50 MG PO TABS
ORAL_TABLET | ORAL | Status: AC
  Administered 2022-04-16: 1 via ORAL
  Filled 2022-04-16: qty 1

## 2022-04-16 MED ORDER — GABAPENTIN 300 MG PO CAPS
ORAL_CAPSULE | ORAL | Status: AC
  Administered 2022-04-16: 600 mg via ORAL
  Filled 2022-04-16: qty 2

## 2022-04-16 MED ORDER — ENOXAPARIN SODIUM 30 MG/0.3ML IJ SOSY
PREFILLED_SYRINGE | INTRAMUSCULAR | Status: AC
  Administered 2022-04-16: 30 mg via SUBCUTANEOUS
  Filled 2022-04-16: qty 0.3

## 2022-04-16 MED ORDER — METOCLOPRAMIDE HCL 10 MG PO TABS
ORAL_TABLET | ORAL | Status: AC
  Administered 2022-04-16: 10 mg via ORAL
  Filled 2022-04-16: qty 1

## 2022-04-16 MED ORDER — LISINOPRIL 20 MG PO TABS
ORAL_TABLET | ORAL | Status: AC
  Administered 2022-04-16: 40 mg via ORAL
  Filled 2022-04-16: qty 2

## 2022-04-16 MED ORDER — CELECOXIB 200 MG PO CAPS
ORAL_CAPSULE | ORAL | Status: AC
  Administered 2022-04-16: 200 mg via ORAL
  Filled 2022-04-16: qty 1

## 2022-04-16 MED ORDER — HYDROMORPHONE HCL 1 MG/ML IJ SOLN
INTRAMUSCULAR | Status: AC
  Administered 2022-04-16: 1 mg via INTRAVENOUS
  Filled 2022-04-16: qty 1

## 2022-04-16 MED ORDER — TRAMADOL HCL 50 MG PO TABS: 50.0000 mg | ORAL_TABLET | ORAL | 0 refills | Status: DC | PRN

## 2022-04-16 MED ORDER — TRAMADOL HCL 50 MG PO TABS
ORAL_TABLET | ORAL | Status: AC
  Filled 2022-04-16: qty 1

## 2022-04-16 MED ORDER — FERROUS SULFATE 325 (65 FE) MG PO TABS
ORAL_TABLET | ORAL | Status: AC
  Administered 2022-04-16: 325 mg via ORAL
  Filled 2022-04-16: qty 1

## 2022-04-16 MED ORDER — OXYCODONE HCL 5 MG PO TABS
ORAL_TABLET | ORAL | Status: AC
  Filled 2022-04-16: qty 2

## 2022-04-16 MED ORDER — CARVEDILOL 12.5 MG PO TABS
ORAL_TABLET | ORAL | Status: AC
  Administered 2022-04-16: 6.25 mg via ORAL
  Filled 2022-04-16: qty 1

## 2022-04-16 MED ORDER — MAGNESIUM HYDROXIDE 400 MG/5ML PO SUSP
ORAL | Status: AC
  Administered 2022-04-16: 30 mL via ORAL
  Filled 2022-04-16: qty 30

## 2022-04-16 MED ORDER — ENOXAPARIN SODIUM 40 MG/0.4ML IJ SOSY: 40.0000 mg | PREFILLED_SYRINGE | INTRAMUSCULAR | 0 refills | Status: DC

## 2022-04-16 MED ORDER — ACETAMINOPHEN 10 MG/ML IV SOLN
INTRAVENOUS | Status: AC
  Filled 2022-04-16: qty 100

## 2022-04-16 MED ORDER — TRAMADOL HCL 50 MG PO TABS
ORAL_TABLET | ORAL | Status: AC
  Administered 2022-04-16: 50 mg via ORAL
  Filled 2022-04-16: qty 1

## 2022-04-16 MED ORDER — PANTOPRAZOLE SODIUM 40 MG PO TBEC
DELAYED_RELEASE_TABLET | ORAL | Status: AC
  Administered 2022-04-16: 40 mg via ORAL
  Filled 2022-04-16: qty 1

## 2022-04-16 MED ORDER — CELECOXIB 200 MG PO CAPS: 200.0000 mg | ORAL_CAPSULE | Freq: Two times a day (BID) | ORAL | 1 refills | Status: DC

## 2022-04-16 MED ORDER — GABAPENTIN 300 MG PO CAPS
ORAL_CAPSULE | ORAL | Status: AC
  Administered 2022-04-16: 1200 mg via ORAL
  Filled 2022-04-16: qty 4

## 2022-04-16 MED ORDER — OXYCODONE HCL 5 MG PO TABS: 5.0000 mg | ORAL_TABLET | ORAL | 0 refills | Status: DC | PRN

## 2022-04-16 MED ORDER — OXYCODONE HCL 5 MG PO TABS
ORAL_TABLET | ORAL | Status: AC
  Administered 2022-04-16: 10 mg via ORAL
  Filled 2022-04-16: qty 2

## 2022-04-16 NOTE — TOC Transition Note (Signed)
Transition of Care [facility]) - CM/SW Discharge Note   Patient Details  Name: Gina Matthews MRN: [id number] Date of Birth: 06/11/1950  Transition of Care [facility]) CM/SW Contact:  Beverly Sessions, RN Phone Number: 04/16/2022, 11:53 AM   Clinical Narrative:    Notified that patient has a rollator at home and needs a RW.  Referral made to [facility] with [facility]          Patient Goals and CMS Choice      Discharge Placement                         Discharge Plan and Services Additional resources added to the After Visit Summary for                                       Social Determinants of Health (SDOH) Interventions SDOH Screenings   Food Insecurity: No Food Insecurity (04/15/2022)  Housing: Low Risk  (04/15/2022)  Transportation Needs: No Transportation Needs (04/15/2022)  Utilities: Not At Risk (04/15/2022)  Tobacco Use: Low Risk  (04/15/2022)     Readmission Risk Interventions     No data to display

## 2022-04-16 NOTE — Progress Notes (Signed)
Physical Therapy Treatment Patient Details Name: Gina Matthews MRN: [id number] DOB: 1950/12/17 Today's Date: 04/16/2022   History of Present Illness Patient is a 72 year old with degenerative arthrosis of left hip, s/p left total hip arthroplasty. History of fibromyalgia, scoliosis    PT Comments    Pt was sitting on BSC upon start of PT session. Reviewed precautions and importance of ther ex, icing, and routine mobility to promote healing. She successfully urinated and was agreeable to PT session. Stood to [employer], was able to wipe herself without assistance. Session proceeded to pt ambulating ~75 ft in hallway with RW. Pt does not ambulate this far at baseline. Distance limited by fatigue. No LOB however vcs for posture correction. Pt was able to perform ascending/descending 4 stair with CGA only. She is planning to DC home this date with 24/7 assistance + HHPT. Will continue to benefit from skilled PT to maximize independence with all ADLs.    Recommendations for follow up therapy are one component of a multi-disciplinary discharge planning process, led by the attending physician.  Recommendations may be updated based on patient status, additional functional criteria and insurance authorization.  Follow Up Recommendations  Home health PT     Assistance Recommended at Discharge Intermittent Supervision/Assistance  Patient can return home with the following A little help with walking and/or transfers;A little help with bathing/dressing/bathroom;Assist for transportation;Help with stairs or ramp for entrance;Assistance with cooking/housework   Equipment Recommendations  Rolling walker (2 wheels)       Precautions / Restrictions Precautions Precautions: Posterior Hip;Fall Precaution Booklet Issued: Yes (comment) Restrictions Weight Bearing Restrictions: Yes LLE Weight Bearing: Weight bearing as tolerated     Mobility  Bed Mobility  General bed mobility comments: Pt was on BSC at start  of session and then in recliner at conclusion.    Transfers Overall transfer level: Needs assistance Equipment used: Rolling walker (2 wheels) Transfers: Sit to/from Stand Sit to Stand: Supervision  General transfer comment: No physical assistance require. Vcs for improved technique and sequencing. Pt does adhere to precautions with cueing. Close supervision for safety.    Ambulation/Gait Ambulation/Gait assistance: Supervision Gait Distance (Feet): 75 Feet Assistive device: Rolling walker (2 wheels) Gait Pattern/deviations: Step-to pattern, Decreased stride length, Trunk flexed Gait velocity: decreased     General Gait Details: Pt was able to ambulate ~ 75 with encouragement. W/C follow for safety. pt reports she does not usually even walk this far at baseline. no LOB. Vcs for posture correction and improved gait sequencing.   Stairs Stairs: Yes Stairs assistance: Min guard Stair Management: One rail Right, Forwards, Step to pattern Number of Stairs: 4 General stair comments: Pt was able to ascend/descend 4 stair with +1 rail only. CGA for safety. pt reports available help will be able to assist her with stair at DC.     Balance Overall balance assessment: Needs assistance Sitting-balance support: Feet supported Sitting balance-Leahy Scale: Good     Standing balance support: Bilateral upper extremity supported, Reliant on assistive device for balance Standing balance-Leahy Scale: Good Standing balance comment: Pt is reliant on RW for dynamic task. Recommended pt use RW at home versus using rollator.       Cognition Arousal/Alertness: Awake/alert Behavior During Therapy: WFL for tasks assessed/performed Overall Cognitive Status: Within Functional Limits for tasks assessed    General Comments: Pt is A and O x 4. reports hving 24/7 ssistance available at DC. Baseline ambulation very limited.  Pertinent Vitals/Pain Pain Assessment Pain Assessment:  0-10 Pain Score: 5  Faces Pain Scale: Hurts little more Pain Location: L hip Pain Descriptors / Indicators: Discomfort, Aching Pain Intervention(s): Limited activity within patient's tolerance, Monitored during session, Premedicated before session, Repositioned, Ice applied    Home Living Family/patient expects to be discharged to:: Private residence Living Arrangements: Alone Available Help at Discharge: Friend(s);Available 24 hours/day Type of Home: House Home Access: Stairs to enter Entrance Stairs-Rails: Right Entrance Stairs-Number of Steps: 4   Home Layout: One level Home Equipment: Conservation officer, nature (2 wheels);Cane - single point;Shower seat;Grab bars - tub/shower Additional Comments: retired Marine scientist        PT Goals (current goals can now be found in the care plan section) Acute Rehab PT Goals Patient Stated Goal: go home Progress towards PT goals: Progressing toward goals    Frequency    BID      PT Plan Current plan remains appropriate       AM-PAC PT "6 Clicks" Mobility   Outcome Measure  Help needed turning from your back to your side while in a flat bed without using bedrails?: A Little Help needed moving from lying on your back to sitting on the side of a flat bed without using bedrails?: A Little Help needed moving to and from a bed to a chair (including a wheelchair)?: A Little Help needed standing up from a chair using your arms (e.g., wheelchair or bedside chair)?: A Little Help needed to walk in hospital room?: A Little Help needed climbing 3-5 steps with a railing? : A Little 6 Click Score: 18    End of Session   Activity Tolerance: Patient tolerated treatment well Patient left: in chair;with call bell/phone within reach;with chair alarm set Nurse Communication: Mobility status PT Visit Diagnosis: Difficulty in walking, not elsewhere classified (R26.2);Other abnormalities of gait and mobility (R26.89)     Time: 2330-0762 PT Time Calculation  (min) (ACUTE ONLY): 18 min  Charges:  $Gait Training: 8-22 mins                     Julaine Fusi PTA 04/16/22, 11:37 AM

## 2022-04-16 NOTE — Discharge Summary (Signed)
Physician Discharge Summary  Subjective: 1 Day Post-Op Procedure(s) (LRB): TOTAL HIP ARTHROPLASTY (Left) Patient reports pain as moderate.   Patient seen in rounds with Dr. Marry Guan. Patient is well, and has had no acute complaints or problems Patient is ready to go home with home health physical therapy.  Physician Discharge Summary  Patient ID: Gina Matthews MRN: [id number] DOB/AGE: [birth year] 72 y.o.  Admit date: 04/15/2022 Discharge date: 04/16/2022  Admission Diagnoses:  Discharge Diagnoses:  Principal Problem:   Hx of total hip arthroplasty, left   Discharged Condition: fair  Hospital Course: The patient is postop day 1 from a left total hip arthroplasty.  She is still having some pain since surgery.  Her vitals have remained stable.  She is ambulating slowly with assistance.  She will do physical therapy today before discharging home.  Treatments: surgery:  Left total hip arthroplasty   SURGEON:  Marciano Sequin. M.D.   ANESTHESIA: general   ESTIMATED BLOOD LOSS: 150 mL   FLUIDS REPLACED: 1500 mL of crystalloid   DRAINS: 2 medium Hemovac drains   IMPLANTS UTILIZED: DePuy size 6 standard offset Actis femoral stem, 54 mm OD Pinnacle 100 acetabular component, +4 mm 10 degree Pinnacle Marathon polyethylene insert, and a 36 mm M-SPEC +1.5 mm hip ball  Discharge Exam: Blood pressure 133/67, pulse 76, temperature 98.7 F (37.1 C), temperature source Temporal, resp. rate 16, height 5\' 4"  (1.626 m), weight 72.6 kg, SpO2 98 %.   Disposition: Discharge disposition: 01-Home or Self Care        Allergies as of 04/16/2022   No Known Allergies      Medication List     TAKE these medications    acetaminophen 500 MG tablet Commonly known as: TYLENOL Take 1,000 mg by mouth 2 (two) times daily.   alendronate 70 MG tablet Commonly known as: FOSAMAX Take 70 mg by mouth once a week. Take with a full glass of water on an empty stomach. Wednesdays   amLODipine  5 MG tablet Commonly known as: NORVASC Take 5 mg by mouth every evening.   b complex vitamins capsule Take 1 capsule by mouth daily.   Biotin 10000 MCG Tabs Take 10,000 mcg by mouth daily.   carvedilol 6.25 MG tablet Commonly known as: COREG Take 6.25 mg by mouth 2 (two) times daily.   celecoxib 200 MG capsule Commonly known as: CELEBREX Take 1 capsule (200 mg total) by mouth 2 (two) times daily. What changed: when to take this   cetirizine 10 MG tablet Commonly known as: ZYRTEC Take 10 mg by mouth daily as needed for allergies.   DULoxetine HCl 60 MG Csdr Take 60 mg by mouth every morning.   enoxaparin 40 MG/0.4ML injection Commonly known as: LOVENOX Inject 0.4 mLs (40 mg total) into the skin daily for 14 days.   gabapentin 600 MG tablet Commonly known as: NEURONTIN Take 600-1,200 mg by mouth See admin instructions. Take 1200 mg in the morning and 600 mg noon and 1200 mg at bedtime   ibuprofen 200 MG tablet Commonly known as: ADVIL Take 400 mg by mouth 2 (two) times daily.   lisinopril 40 MG tablet Commonly known as: ZESTRIL Take 40 mg by mouth every morning.   multivitamin with minerals tablet Take 1 tablet by mouth daily. Senior   oxyCODONE 5 MG immediate release tablet Commonly known as: Oxy IR/ROXICODONE Take 1 tablet (5 mg total) by mouth every 4 (four) hours as needed for moderate pain (pain score 4-6).  SLOW FE PO Take 1 tablet by mouth daily.   traMADol 50 MG tablet Commonly known as: ULTRAM Take 1-2 tablets (50-100 mg total) by mouth every 4 (four) hours as needed for moderate pain. What changed: how much to take   traZODone 50 MG tablet Commonly known as: DESYREL Take 50 mg by mouth at bedtime.   vitamin D3 25 MCG tablet Commonly known as: CHOLECALCIFEROL Take 1,000 Units by mouth daily.               Durable Medical Equipment  (From admission, onward)           Start     Ordered   04/15/22 1358  DME Walker rolling  Once        Question:  Patient needs a walker to treat with the following condition  Answer:  S/P total hip arthroplasty   04/15/22 1358   04/15/22 1358  DME Bedside commode  Once       Comments: Patient is not able to walk the distance required to go the bathroom, or he/she is unable to safely negotiate stairs required to access the bathroom.  A 3in1 BSC will alleviate this problem  Question:  Patient needs a bedside commode to treat with the following condition  Answer:  S/P total hip arthroplasty   04/15/22 1358            Follow-up Information     Hooten, Laurice Record, MD Follow up on 05/28/2022.   Specialty: Orthopedic Surgery Why: at 9:30 AM Contact information: [address] [facility] [place] [address] [phone]                 Signed: Prescott Parma, Blaise Palladino 04/16/2022, 7:00 AM   Objective: Vital signs in last 24 hours: Temp:  [97 F (36.1 C)-98.7 F (37.1 C)] 98.7 F (37.1 C) (01/25 0500) Pulse Rate:  [76-109] 76 (01/25 0500) Resp:  [12-20] 16 (01/25 0500) BP: (108-133)/(58-81) 133/67 (01/25 0500) SpO2:  [95 %-100 %] 98 % (01/25 0500)  Intake/Output from previous day:  Intake/Output Summary (Last 24 hours) at 04/16/2022 0700 Last data filed at 04/16/2022 0500 Gross per 24 hour  Intake 3871.67 ml  Output 870 ml  Net 3001.67 ml    Intake/Output this shift: Total I/O In: 1671.7 [P.O.:120; I.V.:1351.7; IV Piggyback:200] Out: 100 [Drains:100]  Labs: No results for input(s): "HGB" in the last 72 hours. No results for input(s): "WBC", "RBC", "HCT", "PLT" in the last 72 hours. No results for input(s): "NA", "K", "CL", "CO2", "BUN", "CREATININE", "GLUCOSE", "CALCIUM" in the last 72 hours. No results for input(s): "LABPT", "INR" in the last 72 hours.  EXAM: General - Patient is Alert and Oriented Extremity - Neurovascular intact Sensation intact distally Dorsiflexion/Plantar flexion intact Compartment soft Incision - clean, dry, with the Hemovac  tubing removed with no complication.  There was minimal drainage. Motor Function -plantarflexion and dorsiflexion are intact.  Assessment/Plan: 1 Day Post-Op Procedure(s) (LRB): TOTAL HIP ARTHROPLASTY (Left) Procedure(s) (LRB): TOTAL HIP ARTHROPLASTY (Left) Past Medical History:  Diagnosis Date   Abnormal EKG    Anemia    Arthritis    Depression    Fibromyalgia    GERD (gastroesophageal reflux disease)    History of hiatal hernia    Hypertension    Scoliosis    had trouble getting spinal in for TKA in 2022   Tachycardia    Principal Problem:   Hx of total hip arthroplasty, left  Estimated body mass index is  27.46 kg/m as calculated from the following:   Height as of this encounter: 5\' 4"  (1.626 m).   Weight as of this encounter: 72.6 kg. Advance diet Up with therapy D/C IV fluids Discharge home with home health Diet - Regular diet Follow up - in 6 weeks Activity - WBAT Disposition - Home Condition Upon Discharge - Stable DVT Prophylaxis - Lovenox and TED hose  , PA-C Orthopaedic Surgery 04/16/2022, 7:00 AM

## 2022-04-16 NOTE — TOC Transition Note (Signed)
Transition of Care [facility]) - CM/SW Discharge Note   Patient Details  Name: Gina Matthews MRN: [id number] Date of Birth: [birth year]  [facility] CM/SW Contact:  Beverly Sessions, RN Phone Number: 04/16/2022, 9:21 AM   Clinical Narrative:    [place] with [place] notified of discharge          Patient Goals and CMS Choice      Discharge Placement                         Discharge Plan and Services Additional resources added to the After Visit Summary for                                       Social Determinants of Health (SDOH) Interventions SDOH Screenings   Food Insecurity: No Food Insecurity (04/15/2022)  Housing: Low Risk  (04/15/2022)  Transportation Needs: No Transportation Needs (04/15/2022)  Utilities: Not At Risk (04/15/2022)  Tobacco Use: Low Risk  (04/15/2022)     Readmission Risk Interventions     No data to display

## 2022-04-16 NOTE — Evaluation (Signed)
Occupational Therapy Evaluation Patient Details Name: Gina Matthews MRN: [id number] DOB: Jul 18, 1950 Today's Date: 04/16/2022   History of Present Illness Patient is a 72 year old with degenerative arthrosis of left hip, s/p left total hip arthroplasty. History of fibromyalgia, scoliosis   Clinical Impression   Patient presenting with decreased Ind in self care,balance, functional mobility/transfers, endurance, and safety awareness. Patient reports living at home alone and independently at baseline. Pt plans on having friend stay with her at hospital discharge to assist as needed. OT educated pt on precautions and AE to increase Ind with self care. Pt reports recently getting LH reacher and we discussed how to utilize for LB dressing. Pt performed bed mobility with supervision and stands with min guard to transfer onto [facility]. She performs her own hygiene and needs assistance to thread clothing onto B feet. Pt reports 7/10 pain and that pain medication is due at 9:30 am. OT discussed she would need to be able to manage steps to enter home before going home and verbalized understanding.  Patient will benefit from acute OT to increase overall independence in the areas of ADLs, functional mobility, and safety awareness in order to safely discharge home with support.      Recommendations for follow up therapy are one component of a multi-disciplinary discharge planning process, led by the attending physician.  Recommendations may be updated based on patient status, additional functional criteria and insurance authorization.   Follow Up Recommendations  Home health OT     Assistance Recommended at Discharge Intermittent Supervision/Assistance  Patient can return home with the following A little help with walking and/or transfers;A little help with bathing/dressing/bathroom;Help with stairs or ramp for entrance;Assist for transportation;Assistance with cooking/housework    Functional Status Assessment   Patient has had a recent decline in their functional status and demonstrates the ability to make significant improvements in function in a reasonable and predictable amount of time.  Equipment Recommendations  None recommended by OT       Precautions / Restrictions Precautions Precautions: Posterior Hip;Fall Restrictions Weight Bearing Restrictions: Yes LLE Weight Bearing: Weight bearing as tolerated      Mobility Bed Mobility Overal bed mobility: Needs Assistance Bed Mobility: Supine to Sit, Sit to Supine     Supine to sit: Supervision Sit to supine: Supervision   General bed mobility comments: verbal cues for techniques to maintain precautions    Transfers Overall transfer level: Needs assistance Equipment used: Rolling walker (2 wheels) Transfers: Sit to/from Stand, Bed to chair/wheelchair/BSC Sit to Stand: Min guard     Step pivot transfers: Min guard            Balance Overall balance assessment: Needs assistance Sitting-balance support: Feet supported Sitting balance-Leahy Scale: Good     Standing balance support: Bilateral upper extremity supported, Reliant on assistive device for balance Standing balance-Leahy Scale: Fair                             ADL either performed or assessed with clinical judgement   ADL Overall ADL's : Needs assistance/impaired     Grooming: Wash/dry hands;Standing;Supervision/safety               Lower Body Dressing: Minimal assistance;Sit to/from stand Lower Body Dressing Details (indicate cue type and reason): to thread onto B feet secondary to precautions Toilet Transfer: Min guard;Regular Toilet;BSC/3in1   Toileting- Clothing Manipulation and Hygiene: Min guard;Sit to/from stand  Functional mobility during ADLs: Supervision/safety;Min guard;Rolling walker (2 wheels)       Vision Patient Visual Report: No change from baseline              Pertinent Vitals/Pain Pain Assessment Pain  Assessment: 0-10 Pain Score: 7  Pain Location: L hip Pain Descriptors / Indicators: Discomfort, Aching Pain Intervention(s): Limited activity within patient's tolerance, Monitored during session, Premedicated before session, Repositioned, Ice applied     Hand Dominance Right   Extremity/Trunk Assessment Upper Extremity Assessment Upper Extremity Assessment: Overall WFL for tasks assessed   Lower Extremity Assessment Lower Extremity Assessment: Defer to PT evaluation       Communication Communication Communication: No difficulties   Cognition Arousal/Alertness: Awake/alert Behavior During Therapy: WFL for tasks assessed/performed Overall Cognitive Status: Within Functional Limits for tasks assessed                                                  Home Living Family/patient expects to be discharged to:: Private residence Living Arrangements: Alone Available Help at Discharge: Friend(s);Available 24 hours/day Type of Home: House Home Access: Stairs to enter [employer] of Steps: 4 Entrance Stairs-Rails: Right Home Layout: One level     Bathroom Shower/Tub: Walk-in shower         Home Equipment: Agricultural consultant (2 wheels);Cane - single point;Shower seat;Grab bars - tub/shower   Additional Comments: retired Engineer, civil (consulting)      Prior Functioning/Environment Prior Level of Function : Independent/Modified Independent                        OT Problem List: Decreased strength;Pain;Decreased range of motion;Decreased activity tolerance;Decreased safety awareness;Decreased knowledge of use of DME or AE;Decreased knowledge of precautions;Impaired balance (sitting and/or standing)      OT Treatment/Interventions: Self-care/ADL training;Therapeutic exercise;Therapeutic activities;Energy conservation;DME and/or AE instruction;Patient/family education;Balance training    OT Goals(Current goals can be found in the care plan section) Acute Rehab  OT Goals Patient Stated Goal: to go home OT Goal Formulation: With patient Time For Goal Achievement: 04/16/22 Potential to Achieve Goals: Good ADL Goals Pt Will Perform Grooming: with supervision;standing Pt Will Perform Lower Body Dressing: with supervision;sit to/from stand;with adaptive equipment Pt Will Transfer to Toilet: with supervision;ambulating Pt Will Perform Toileting - Clothing Manipulation and hygiene: with supervision;sit to/from stand  OT Frequency: Min 2X/week       AM-PAC OT "6 Clicks" Daily Activity     Outcome Measure Help from another person eating meals?: None Help from another person taking care of personal grooming?: None Help from another person toileting, which includes using toliet, bedpan, or urinal?: A Little Help from another person bathing (including washing, rinsing, drying)?: A Little Help from another person to put on and taking off regular upper body clothing?: None Help from another person to put on and taking off regular lower body clothing?: A Little 6 Click Score: 21   End of Session Equipment Utilized During Treatment: Rolling walker (2 wheels) Nurse Communication: Mobility status  Activity Tolerance: Patient limited by pain Patient left: in bed;with call bell/phone within reach;with bed alarm set  OT Visit Diagnosis: Unsteadiness on feet (R26.81);Muscle weakness (generalized) (M62.81);Pain Pain - Right/Left: Left Pain - part of body: Hip                Time: 9811-9147 OT Time  Calculation (min): 13 min Charges:  OT General Charges $OT Visit: 1 Visit OT Evaluation $OT Eval Moderate Complexity: 1 Mod OT Treatments $Self Care/Home Management : 8-22 mins  Darleen Crocker, MS, OTR/L , CBIS ascom [phone]  04/16/22, 10:21 AM

## 2022-04-16 NOTE — Progress Notes (Signed)
  Subjective: 1 Day Post-Op Procedure(s) (LRB): TOTAL HIP ARTHROPLASTY (Left) Patient reports pain as moderate.   Patient is well, and has had no acute complaints or problems Plan is to go Home after hospital stay. Negative for chest pain and shortness of breath Fever: no Gastrointestinal: Negative for nausea and vomiting  Objective: Vital signs in last 24 hours: Temp:  [97 F (36.1 C)-98.7 F (37.1 C)] 98.7 F (37.1 C) (01/25 0500) Pulse Rate:  [76-109] 76 (01/25 0500) Resp:  [12-20] 16 (01/25 0500) BP: (108-133)/(58-81) 133/67 (01/25 0500) SpO2:  [95 %-100 %] 98 % (01/25 0500)  Intake/Output from previous day:  Intake/Output Summary (Last 24 hours) at 04/16/2022 0657 Last data filed at 04/16/2022 0500 Gross per 24 hour  Intake 3871.67 ml  Output 870 ml  Net 3001.67 ml    Intake/Output this shift: Total I/O In: 1671.7 [P.O.:120; I.V.:1351.7; IV Piggyback:200] Out: 100 [Drains:100]  Labs: No results for input(s): "HGB" in the last 72 hours. No results for input(s): "WBC", "RBC", "HCT", "PLT" in the last 72 hours. No results for input(s): "NA", "K", "CL", "CO2", "BUN", "CREATININE", "GLUCOSE", "CALCIUM" in the last 72 hours. No results for input(s): "LABPT", "INR" in the last 72 hours.   EXAM General - Patient is Alert and Oriented Extremity - Neurovascular intact Sensation intact distally Dorsiflexion/Plantar flexion intact Compartment soft Dressing/Incision - clean, dry, with very minimal discharge.  The Hemovac tubing was removed with no complication.  The tubing was intact on removal. Motor Function - intact, moving foot and toes well on exam.   Past Medical History:  Diagnosis Date   Abnormal EKG    Anemia    Arthritis    Depression    Fibromyalgia    GERD (gastroesophageal reflux disease)    History of hiatal hernia    Hypertension    Scoliosis    had trouble getting spinal in for TKA in 2022   Tachycardia     Assessment/Plan: 1 Day Post-Op  Procedure(s) (LRB): TOTAL HIP ARTHROPLASTY (Left) Principal Problem:   Hx of total hip arthroplasty, left  Estimated body mass index is 27.46 kg/m as calculated from the following:   Height as of this encounter: 5\' 4"  (1.626 m).   Weight as of this encounter: 72.6 kg. Advance diet Up with therapy D/C IV fluids Discharge home with home health  DVT Prophylaxis - Lovenox, Foot Pumps, and TED hose Weight-Bearing as tolerated to left leg  Reche Dixon, PA-C Orthopaedic Surgery 04/16/2022, 6:57 AM

## 2022-05-03 ENCOUNTER — Emergency Department
Admission: EM | Admit: 2022-05-03 | Discharge: 2022-05-03 | Disposition: A | Discharge status: Home / Self Care | Payer: Medicare Other | Attending: Emergency Medicine | Admitting: Emergency Medicine

## 2022-05-03 ENCOUNTER — Other Ambulatory Visit: Payer: Self-pay

## 2022-05-03 ENCOUNTER — Emergency Department: Payer: Medicare Other

## 2022-05-03 DIAGNOSIS — Z96642 Presence of left artificial hip joint: Secondary | ICD-10-CM | POA: Insufficient documentation

## 2022-05-03 DIAGNOSIS — T8483XA Hemorrhage due to internal orthopedic prosthetic devices, implants and grafts, initial encounter: Secondary | ICD-10-CM | POA: Diagnosis present

## 2022-05-03 DIAGNOSIS — Y831 Surgical operation with implant of artificial internal device as the cause of abnormal reaction of the patient, or of later complication, without mention of misadventure at the time of the procedure: Secondary | ICD-10-CM | POA: Diagnosis not present

## 2022-05-03 DIAGNOSIS — I10 Essential (primary) hypertension: Secondary | ICD-10-CM | POA: Diagnosis not present

## 2022-05-03 DIAGNOSIS — T8149XA Infection following a procedure, other surgical site, initial encounter: Secondary | ICD-10-CM | POA: Insufficient documentation

## 2022-05-03 DIAGNOSIS — L03116 Cellulitis of left lower limb: Secondary | ICD-10-CM | POA: Diagnosis not present

## 2022-05-03 DIAGNOSIS — S7012XA Contusion of left thigh, initial encounter: Secondary | ICD-10-CM

## 2022-05-03 LAB — CBC WITH DIFFERENTIAL/PLATELET
Abs Immature Granulocytes: 0.02 10*3/uL (ref 0.00–0.07)
Basophils Absolute: 0.1 10*3/uL (ref 0.0–0.1)
Basophils Relative: 1 %
Eosinophils Absolute: 0.1 10*3/uL (ref 0.0–0.5)
Eosinophils Relative: 2 %
HCT: 28.8 % — ABNORMAL LOW (ref 36.0–46.0)
Hemoglobin: 9.1 g/dL — ABNORMAL LOW (ref 12.0–15.0)
Immature Granulocytes: 0 %
Lymphocytes Relative: 22 %
Lymphs Abs: 1.4 10*3/uL (ref 0.7–4.0)
MCH: 31.7 pg (ref 26.0–34.0)
MCHC: 31.6 g/dL (ref 30.0–36.0)
MCV: 100.3 fL — ABNORMAL HIGH (ref 80.0–100.0)
Monocytes Absolute: 0.6 10*3/uL (ref 0.1–1.0)
Monocytes Relative: 10 %
Neutro Abs: 4.2 10*3/uL (ref 1.7–7.7)
Neutrophils Relative %: 65 %
Platelets: 402 10*3/uL — ABNORMAL HIGH (ref 150–400)
RBC: 2.87 MIL/uL — ABNORMAL LOW (ref 3.87–5.11)
RDW: 13.6 % (ref 11.5–15.5)
WBC: 6.3 10*3/uL (ref 4.0–10.5)
nRBC: 0 % (ref 0.0–0.2)

## 2022-05-03 LAB — COMPREHENSIVE METABOLIC PANEL
ALT: 13 U/L (ref 0–44)
AST: 19 U/L (ref 15–41)
Albumin: 3 g/dL — ABNORMAL LOW (ref 3.5–5.0)
Alkaline Phosphatase: 77 U/L (ref 38–126)
Anion gap: 6 (ref 5–15)
BUN: 11 mg/dL (ref 8–23)
CO2: 23 mmol/L (ref 22–32)
Calcium: 8.6 mg/dL — ABNORMAL LOW (ref 8.9–10.3)
Chloride: 108 mmol/L (ref 98–111)
Creatinine, Ser: 0.49 mg/dL (ref 0.44–1.00)
GFR, Estimated: >60 mL/min (ref >60)
Glucose, Bld: 113 mg/dL — ABNORMAL HIGH (ref 70–99)
Potassium: 4.2 mmol/L (ref 3.5–5.1)
Sodium: 137 mmol/L (ref 135–145)
Total Bilirubin: 0.5 mg/dL (ref 0.3–1.2)
Total Protein: 5.7 g/dL — ABNORMAL LOW (ref 6.5–8.1)

## 2022-05-03 MED ORDER — IBUPROFEN 600 MG PO TABS
600.0000 mg | ORAL_TABLET | Freq: Once | ORAL | Status: AC
  Administered 2022-05-03: 600 mg via ORAL
  Filled 2022-05-03: qty 1

## 2022-05-03 MED ORDER — CEPHALEXIN 500 MG PO CAPS: 500.0000 mg | ORAL_CAPSULE | Freq: Three times a day (TID) | ORAL | 0 refills | Status: DC

## 2022-05-03 NOTE — ED Provider Notes (Signed)
[facility] Provider Note    Event Date/Time   First MD Initiated Contact with Patient 05/03/22 1216     (approximate)   History   Chief Complaint: Post-op Problem   HPI  Gina Matthews is a 72 y.o. female with a history of hypertension GERD fibromyalgia who comes ED complaining of bleeding from left hip surgical wound.  Patient underwent left total hip arthroplasty April 15, 2022.  She completed 2 weeks of Lovenox prophylaxis, finished 4 days ago at the same time that staples were being removed from the surgical site.  That night, she noticed a large amount of bloody fluid drained from the left hip overnight into the bed.  She has had continued bloody drainage from the wound over the last few days.  No chest pain or shortness of breath.  No fevers chills lightheadedness or dizziness or syncope.     Physical Exam   Triage Vital Signs: ED Triage Vitals  Enc Vitals Group     BP 05/03/22 1156 120/69     Pulse Rate 05/03/22 1156 74     Resp 05/03/22 1156 18     Temp 05/03/22 1156 99.3 F (37.4 C)     Temp src --      SpO2 05/03/22 1156 97 %     Weight 05/03/22 1157 155 lb (70.3 kg)     Height 05/03/22 1157 5' 3"$  (1.6 m)     Head Circumference --      Peak Flow --      Pain Score 05/03/22 1157 0     Pain Loc --      Pain Edu? --      Excl. in [place]? --     Most recent vital signs: Vitals:   05/03/22 1156  BP: 120/69  Pulse: 74  Resp: 18  Temp: 99.3 F (37.4 C)  SpO2: 97%    General: Awake, no distress.  CV:  Good peripheral perfusion.  Normal distal pulses. Resp:  Normal effort.  Abd:  No distention.  Other:  Left lateral hip surgical incision is healing well except for 1 cm defect overlying the greater trochanter.  There is scattered erythema in the peri-incisional soft tissue spanning about 10 cm.  Tissues are soft.  No significant ecchymosis.  No crepitus.  No palpable mass or hematoma.   ED Results / Procedures / Treatments    Labs (all labs ordered are listed, but only abnormal results are displayed) Labs Reviewed  CBC WITH DIFFERENTIAL/PLATELET - Abnormal; Notable for the following components:      Result Value   RBC 2.87 (*)    Hemoglobin 9.1 (*)    HCT 28.8 (*)    MCV 100.3 (*)    Platelets 402 (*)    All other components within normal limits  COMPREHENSIVE METABOLIC PANEL - Abnormal; Notable for the following components:   Glucose, Bld 113 (*)    Calcium 8.6 (*)    Total Protein 5.7 (*)    Albumin 3.0 (*)    All other components within normal limits     EKG    RADIOLOGY CT left hip shows sizable hematoma in the left lateral subcutaneous hip.  Radiology report reviewed   PROCEDURES:  Procedures   MEDICATIONS ORDERED IN ED: Medications  ibuprofen (ADVIL) tablet 600 mg (600 mg Oral Given 05/03/22 1249)     IMPRESSION / MDM / ASSESSMENT AND PLAN / ED COURSE  I reviewed the triage vital signs and the  nursing notes.  DDx: Surgical wound soft tissue infection, seroma, hematoma, anemia   Patient's presentation is most consistent with acute presentation with potential threat to life or bodily function.  Patient presents with redness and swelling around the incision from her recent hip replacement.  Clinically she is developing a mild cellulitis.  No purulent drainage, doubt abscess necrotizing fasciitis septic arthritis or osteomyelitis.  I suspect the Lovenox was allowing a hematoma to accumulate slowly, which then decompressed after staples were removed.  She has continuous scant dark red oozing from the wound, but no brisk or pulsatile bleeding.  Labs show 4 point decrease in her hemoglobin from preoperative level.  This seems out of proportion to expected blood loss from the procedure and the EBL of 150 listed on the operative note, so I will obtain a CT of the hip to look for persistent hematoma or signs of significant ongoing bleeding.  If CT is reassuring, now that Lovenox has been  discontinued, I think bleeding can be expected to resolve on its own without worsening anemia.  Will treat the patient with Keflex for cellulitis.   ----------------------------------------- 2:50 PM on 05/03/2022 ----------------------------------------- CT does show 2 areas of hematoma formation.  Discussed with Dr. Marry Guan who agrees with oral antibiotics for cellulitis coverage and will plan for office follow-up tomorrow.  Patient is not symptomatic and has normal vital signs, and anemia has developed over the course of 2 weeks, not concerned for brisk hemorrhage or symptomatic anemia at this time.  No fever, normal white blood cell count, and the drainage looks like frank blood without purulence.  No evidence of deep space abscess formation.      FINAL CLINICAL IMPRESSION(S) / ED DIAGNOSES   Final diagnoses:  Postoperative cellulitis of surgical wound  Hematoma of left thigh, initial encounter     Rx / DC Orders   ED Discharge Orders          Ordered    cephALEXin (KEFLEX) 500 MG capsule  3 times daily        05/03/22 1449             Note:  This document was prepared using Dragon voice recognition software and may include unintentional dictation errors.   [name], MD 05/03/22 1451

## 2022-05-03 NOTE — ED Triage Notes (Signed)
Patient states recent hip surgery to the left. Pt states sutures have been removed and pt had steri-strips on. Pt states that her suture site has opened up and is red in color. Pt is on lovenox. Pt denies fever.

## 2022-05-10 ENCOUNTER — Emergency Department: Payer: Medicare Other

## 2022-05-10 ENCOUNTER — Other Ambulatory Visit: Payer: Self-pay

## 2022-05-10 ENCOUNTER — Inpatient Hospital Stay
Admission: EM | Admit: 2022-05-10 | Discharge: 2022-05-15 | DRG: 908 | Disposition: A | Discharge status: Skilled Nursing Facility | Payer: Medicare Other | Attending: Internal Medicine | Admitting: Internal Medicine

## 2022-05-10 DIAGNOSIS — Z96642 Presence of left artificial hip joint: Secondary | ICD-10-CM | POA: Diagnosis present

## 2022-05-10 DIAGNOSIS — E876 Hypokalemia: Secondary | ICD-10-CM | POA: Diagnosis not present

## 2022-05-10 DIAGNOSIS — Z79899 Other long term (current) drug therapy: Secondary | ICD-10-CM | POA: Diagnosis not present

## 2022-05-10 DIAGNOSIS — F331 Major depressive disorder, recurrent, moderate: Secondary | ICD-10-CM | POA: Diagnosis present

## 2022-05-10 DIAGNOSIS — S7002XA Contusion of left hip, initial encounter: Secondary | ICD-10-CM | POA: Diagnosis present

## 2022-05-10 DIAGNOSIS — K219 Gastro-esophageal reflux disease without esophagitis: Secondary | ICD-10-CM

## 2022-05-10 DIAGNOSIS — Y831 Surgical operation with implant of artificial internal device as the cause of abnormal reaction of the patient, or of later complication, without mention of misadventure at the time of the procedure: Secondary | ICD-10-CM | POA: Diagnosis present

## 2022-05-10 DIAGNOSIS — M96840 Postprocedural hematoma of a musculoskeletal structure following a musculoskeletal system procedure: Principal | ICD-10-CM | POA: Diagnosis present

## 2022-05-10 DIAGNOSIS — Z791 Long term (current) use of non-steroidal anti-inflammatories (NSAID): Secondary | ICD-10-CM

## 2022-05-10 DIAGNOSIS — S7002XS Contusion of left hip, sequela: Secondary | ICD-10-CM | POA: Diagnosis not present

## 2022-05-10 DIAGNOSIS — Z7983 Long term (current) use of bisphosphonates: Secondary | ICD-10-CM | POA: Diagnosis not present

## 2022-05-10 DIAGNOSIS — E663 Overweight: Secondary | ICD-10-CM

## 2022-05-10 DIAGNOSIS — E861 Hypovolemia: Secondary | ICD-10-CM | POA: Diagnosis not present

## 2022-05-10 DIAGNOSIS — M797 Fibromyalgia: Secondary | ICD-10-CM | POA: Diagnosis present

## 2022-05-10 DIAGNOSIS — M419 Scoliosis, unspecified: Secondary | ICD-10-CM | POA: Diagnosis present

## 2022-05-10 DIAGNOSIS — I9589 Other hypotension: Secondary | ICD-10-CM | POA: Diagnosis not present

## 2022-05-10 DIAGNOSIS — T148XXA Other injury of unspecified body region, initial encounter: Principal | ICD-10-CM

## 2022-05-10 DIAGNOSIS — S7002XD Contusion of left hip, subsequent encounter: Secondary | ICD-10-CM | POA: Diagnosis not present

## 2022-05-10 DIAGNOSIS — Z96651 Presence of right artificial knee joint: Secondary | ICD-10-CM | POA: Diagnosis present

## 2022-05-10 DIAGNOSIS — I959 Hypotension, unspecified: Secondary | ICD-10-CM | POA: Diagnosis present

## 2022-05-10 DIAGNOSIS — K449 Diaphragmatic hernia without obstruction or gangrene: Secondary | ICD-10-CM | POA: Diagnosis present

## 2022-05-10 DIAGNOSIS — Z6827 Body mass index (BMI) 27.0-27.9, adult: Secondary | ICD-10-CM | POA: Diagnosis not present

## 2022-05-10 DIAGNOSIS — I10 Essential (primary) hypertension: Secondary | ICD-10-CM | POA: Diagnosis present

## 2022-05-10 LAB — CBC WITH DIFFERENTIAL/PLATELET
Abs Immature Granulocytes: 0.13 10*3/uL — ABNORMAL HIGH (ref 0.00–0.07)
Basophils Absolute: 0.1 10*3/uL (ref 0.0–0.1)
Basophils Relative: 1 %
Eosinophils Absolute: 0.2 10*3/uL (ref 0.0–0.5)
Eosinophils Relative: 2 %
HCT: 32.4 % — ABNORMAL LOW (ref 36.0–46.0)
Hemoglobin: 10.2 g/dL — ABNORMAL LOW (ref 12.0–15.0)
Immature Granulocytes: 1 %
Lymphocytes Relative: 5 %
Lymphs Abs: 0.6 10*3/uL — ABNORMAL LOW (ref 0.7–4.0)
MCH: 31.2 pg (ref 26.0–34.0)
MCHC: 31.5 g/dL (ref 30.0–36.0)
MCV: 99.1 fL (ref 80.0–100.0)
Monocytes Absolute: 1 10*3/uL (ref 0.1–1.0)
Monocytes Relative: 10 %
Neutro Abs: 8.5 10*3/uL — ABNORMAL HIGH (ref 1.7–7.7)
Neutrophils Relative %: 81 %
Platelets: 384 10*3/uL (ref 150–400)
RBC: 3.27 MIL/uL — ABNORMAL LOW (ref 3.87–5.11)
RDW: 14.1 % (ref 11.5–15.5)
Smear Review: NORMAL
WBC: 10.5 10*3/uL (ref 4.0–10.5)
nRBC: 0 % (ref 0.0–0.2)

## 2022-05-10 LAB — COMPREHENSIVE METABOLIC PANEL
ALT: 17 U/L (ref 0–44)
AST: 26 U/L (ref 15–41)
Albumin: 2.7 g/dL — ABNORMAL LOW (ref 3.5–5.0)
Alkaline Phosphatase: 69 U/L (ref 38–126)
Anion gap: 11 (ref 5–15)
BUN: 26 mg/dL — ABNORMAL HIGH (ref 8–23)
CO2: 16 mmol/L — ABNORMAL LOW (ref 22–32)
Calcium: 8.1 mg/dL — ABNORMAL LOW (ref 8.9–10.3)
Chloride: 106 mmol/L (ref 98–111)
Creatinine, Ser: 0.98 mg/dL (ref 0.44–1.00)
GFR, Estimated: >60 mL/min (ref >60)
Glucose, Bld: 68 mg/dL — ABNORMAL LOW (ref 70–99)
Potassium: 3.8 mmol/L (ref 3.5–5.1)
Sodium: 133 mmol/L — ABNORMAL LOW (ref 135–145)
Total Bilirubin: 0.6 mg/dL (ref 0.3–1.2)
Total Protein: 5.7 g/dL — ABNORMAL LOW (ref 6.5–8.1)

## 2022-05-10 LAB — HEMOGLOBIN AND HEMATOCRIT, BLOOD
HCT: 29.1 % — ABNORMAL LOW (ref 36.0–46.0)
Hemoglobin: 9.2 g/dL — ABNORMAL LOW (ref 12.0–15.0)

## 2022-05-10 LAB — PROTIME-INR
INR: 1.3 — ABNORMAL HIGH (ref 0.8–1.2)
Prothrombin Time: 16 seconds — ABNORMAL HIGH (ref 11.4–15.2)

## 2022-05-10 MED ORDER — FERROUS SULFATE 325 (65 FE) MG PO TABS
325.0000 mg | ORAL_TABLET | Freq: Every day | ORAL | Status: DC
  Administered 2022-05-11 – 2022-05-15 (×5): 325 mg via ORAL
  Filled 2022-05-10 (×5): qty 1

## 2022-05-10 MED ORDER — ONDANSETRON HCL 4 MG PO TABS: 4.0000 mg | ORAL_TABLET | Freq: Four times a day (QID) | ORAL | Status: DC | PRN

## 2022-05-10 MED ORDER — MORPHINE SULFATE (PF) 2 MG/ML IV SOLN
2.0000 mg | Freq: Once | INTRAVENOUS | Status: AC
  Administered 2022-05-10: 2 mg via INTRAVENOUS
  Filled 2022-05-10: qty 1

## 2022-05-10 MED ORDER — GABAPENTIN 300 MG PO CAPS
600.0000 mg | ORAL_CAPSULE | Freq: Every day | ORAL | Status: DC
  Administered 2022-05-10 – 2022-05-15 (×5): 600 mg via ORAL
  Filled 2022-05-10 (×6): qty 2

## 2022-05-10 MED ORDER — TRAMADOL HCL 50 MG PO TABS
100.0000 mg | ORAL_TABLET | Freq: Four times a day (QID) | ORAL | Status: DC | PRN
  Administered 2022-05-10: 100 mg via ORAL
  Filled 2022-05-10: qty 2

## 2022-05-10 MED ORDER — OXYCODONE-ACETAMINOPHEN 5-325 MG PO TABS
1.0000 | ORAL_TABLET | ORAL | Status: DC | PRN
  Administered 2022-05-10 – 2022-05-11 (×4): 1 via ORAL
  Filled 2022-05-10 (×4): qty 1

## 2022-05-10 MED ORDER — SODIUM CHLORIDE 0.9 % IV SOLN: INTRAVENOUS | Status: AC

## 2022-05-10 MED ORDER — IOHEXOL 300 MG/ML  SOLN
100.0000 mL | Freq: Once | INTRAMUSCULAR | Status: AC | PRN
  Administered 2022-05-10: 100 mL via INTRAVENOUS

## 2022-05-10 MED ORDER — DULOXETINE HCL 60 MG PO CPEP
60.0000 mg | ORAL_CAPSULE | ORAL | Status: DC
  Administered 2022-05-10 – 2022-05-15 (×6): 60 mg via ORAL
  Filled 2022-05-10 (×6): qty 1

## 2022-05-10 MED ORDER — PANTOPRAZOLE SODIUM 40 MG PO TBEC
40.0000 mg | DELAYED_RELEASE_TABLET | Freq: Every day | ORAL | Status: DC
  Administered 2022-05-10 – 2022-05-11 (×2): 40 mg via ORAL
  Filled 2022-05-10 (×2): qty 1

## 2022-05-10 MED ORDER — ONDANSETRON HCL 4 MG/2ML IJ SOLN
4.0000 mg | Freq: Four times a day (QID) | INTRAMUSCULAR | Status: DC | PRN
  Administered 2022-05-11 (×2): 4 mg via INTRAVENOUS

## 2022-05-10 MED ORDER — VITAMIN D 25 MCG (1000 UNIT) PO TABS
1000.0000 [IU] | ORAL_TABLET | Freq: Every day | ORAL | Status: DC
  Administered 2022-05-11 – 2022-05-15 (×5): 1000 [IU] via ORAL
  Filled 2022-05-10 (×5): qty 1

## 2022-05-10 MED ORDER — ONDANSETRON HCL 4 MG/2ML IJ SOLN
4.0000 mg | Freq: Once | INTRAMUSCULAR | Status: AC
  Administered 2022-05-10: 4 mg via INTRAVENOUS
  Filled 2022-05-10: qty 2

## 2022-05-10 MED ORDER — GABAPENTIN 300 MG PO CAPS
1200.0000 mg | ORAL_CAPSULE | Freq: Two times a day (BID) | ORAL | Status: DC
  Administered 2022-05-10 – 2022-05-15 (×10): 1200 mg via ORAL
  Filled 2022-05-10 (×10): qty 4

## 2022-05-10 MED ORDER — TRAMADOL HCL 50 MG PO TABS: 100.0000 mg | ORAL_TABLET | ORAL | Status: DC | PRN

## 2022-05-10 MED ORDER — SODIUM CHLORIDE 0.9 % IV BOLUS
1000.0000 mL | Freq: Once | INTRAVENOUS | Status: AC
  Administered 2022-05-10: 1000 mL via INTRAVENOUS

## 2022-05-10 MED ORDER — TRAZODONE HCL 50 MG PO TABS
50.0000 mg | ORAL_TABLET | Freq: Every day | ORAL | Status: DC
  Administered 2022-05-10 – 2022-05-14 (×5): 50 mg via ORAL
  Filled 2022-05-10 (×5): qty 1

## 2022-05-10 MED ORDER — ADULT MULTIVITAMIN W/MINERALS CH
1.0000 | ORAL_TABLET | Freq: Every day | ORAL | Status: DC
  Administered 2022-05-11 – 2022-05-15 (×5): 1 via ORAL
  Filled 2022-05-10 (×5): qty 1

## 2022-05-10 MED ORDER — ACETAMINOPHEN 500 MG PO TABS
1000.0000 mg | ORAL_TABLET | Freq: Two times a day (BID) | ORAL | Status: DC
  Administered 2022-05-10 – 2022-05-11 (×3): 1000 mg via ORAL
  Filled 2022-05-10 (×3): qty 2

## 2022-05-10 NOTE — ED Provider Notes (Signed)
[facility] Provider Note    Event Date/Time   First MD Initiated Contact with Patient 05/10/22 [phone]     (approximate)   History   Hip Pain   HPI  Gina Matthews is a 72 y.o. female status post left total hip arthroplasty on 04/11/2022 who presents today with bleeding from the incision site.  Patient reports that she was seen in the clinic earlier this week and her Steri-Strips were changed, and she continued her physical therapy as planned, and then last night she woke up with her bed soaked with blood, and worsening pain this morning.  She reports that she did her physical therapy as planned yesterday and thought maybe she "overdid it."  She is not on anticoagulation currently, finished her Lovenox on 04/30/22.  She has been taking Keflex.  She reports that there is no purulent drainage, only bright red blood.  She reports that she was able to walk yesterday, however she reports that her pain has worsened to the point where she cannot walk today.  Patient Active Problem List   Diagnosis Date Noted   Hematoma of left hip 05/10/2022   Hx of total hip arthroplasty, left 04/15/2022   Primary osteoarthritis of left hip 02/01/2022   History of shingles 02/21/2021   Status post total right knee replacement 01/27/2021   Gastroesophageal reflux disease with hiatal hernia 10/03/2020   Tachycardia 06/28/2020   Major depressive disorder, recurrent, moderate (HCC) 11/23/2019   Age-related osteoporosis without current pathological fracture 07/17/2019   Abnormal electrocardiogram (ECG) (EKG) 10/01/2018   Dental injury 10/01/2018   Maxillary fracture (HCC) 10/01/2018   Syncope 10/01/2018   Kyphosis of thoracic region 07/30/2017   Chronic midline low back pain without sciatica 07/30/2017   Hyponatremia 11/20/2016   Chronic pain syndrome 12/23/2014   Fibromyalgia muscle pain 12/23/2014   Allergic rhinitis 12/15/2014   Arthritis involving multiple sites 12/15/2014    Chronic insomnia 12/15/2014   Arthritis of both knees 07/31/2014   Seasonal allergies 07/31/2014   Hypertension, essential, benign 06/08/2014   Alcohol dependence in early, early partial, sustained full, or sustained partial remission (HCC) 01/03/2014   History of alcohol dependence (HCC) 01/03/2014          Physical Exam   Triage Vital Signs: ED Triage Vitals  Enc Vitals Group     BP 05/10/22 0737 113/74     Pulse Rate 05/10/22 0737 (!) 116     Resp 05/10/22 0737 18     Temp 05/10/22 0740 98.9 F (37.2 C)     Temp Source 05/10/22 0740 Oral     SpO2 05/10/22 0737 98 %     Weight 05/10/22 0738 155 lb (70.3 kg)     Height 05/10/22 0738 5\' 3"  (1.6 m)     Head Circumference --      Peak Flow --      Pain Score 05/10/22 0738 8     Pain Loc --      Pain Edu? --      Excl. in GC? --     Most recent vital signs: Vitals:   05/10/22 1046 05/10/22 1103  BP:  118/73  Pulse:  96  Resp:  16  Temp: 98.3 F (36.8 C)   SpO2:  100%    Physical Exam Vitals and nursing note reviewed.  Constitutional:      General: Awake and alert. No acute distress.    Appearance: Normal appearance. The patient is overweight.  HENT:  Head: Normocephalic and atraumatic.     Mouth: Mucous membranes are moist.  Eyes:     General: PERRL. Normal EOMs        Right eye: No discharge.        Left eye: No discharge.     Conjunctiva/sclera: Conjunctivae normal.  Cardiovascular:     Rate and Rhythm: Normal rate and regular rhythm.     Pulses: Normal pulses.  Pulmonary:     Effort: Pulmonary effort is normal. No respiratory distress.     Breath sounds: Normal breath sounds.  Abdominal:     Abdomen is soft. There is no abdominal tenderness. No rebound or guarding. No distention. Musculoskeletal:        General: No swelling. Normal range of motion.     Cervical back: Normal range of motion and neck supple.  Left leg with large lateral incision with overlying bloodied Steri-Strips.  No active  significant bleeding noted.  There is lateral swelling diffusely to the thigh, though compartments are soft, no induration.  There is mild overlying erythema though no lymphangitis.  Nontender to palpation.  Sensation intact to light touch.  Distal pulses intact. Negative log roll Skin:    General: Skin is warm and dry.     Capillary Refill: Capillary refill takes less than 2 seconds.     Findings: No rash.  Neurological:     Mental Status: The patient is awake and alert.      ED Results / Procedures / Treatments   Labs (all labs ordered are listed, but only abnormal results are displayed) Labs Reviewed  CBC WITH DIFFERENTIAL/PLATELET - Abnormal; Notable for the following components:      Result Value   RBC 3.27 (*)    Hemoglobin 10.2 (*)    HCT 32.4 (*)    Neutro Abs 8.5 (*)    Lymphs Abs 0.6 (*)    Abs Immature Granulocytes 0.13 (*)    All other components within normal limits  COMPREHENSIVE METABOLIC PANEL - Abnormal; Notable for the following components:   Sodium 133 (*)    CO2 16 (*)    Glucose, Bld 68 (*)    BUN 26 (*)    Calcium 8.1 (*)    Total Protein 5.7 (*)    Albumin 2.7 (*)    All other components within normal limits  PROTIME-INR - Abnormal; Notable for the following components:   Prothrombin Time 16.0 (*)    INR 1.3 (*)    All other components within normal limits  HIV ANTIBODY (ROUTINE TESTING W REFLEX)  HEMOGLOBIN AND HEMATOCRIT, BLOOD     EKG     RADIOLOGY I independently reviewed and interpreted imaging and agree with radiologists findings.     PROCEDURES:  Critical Care performed:   Procedures   MEDICATIONS ORDERED IN ED: Medications  acetaminophen (TYLENOL) tablet 1,000 mg (1,000 mg Oral Given 05/10/22 1134)  DULoxetine (CYMBALTA) DR capsule 60 mg (60 mg Oral Given 05/10/22 1134)  traZODone (DESYREL) tablet 50 mg (has no administration in time range)  ferrous sulfate tablet 325 mg (has no administration in time range)  gabapentin  (NEURONTIN) capsule 1,200 mg (has no administration in time range)  multivitamin with minerals tablet 1 tablet (has no administration in time range)  cholecalciferol (VITAMIN D3) 25 MCG (1000 UNIT) tablet 1,000 Units (has no administration in time range)  0.9 %  sodium chloride infusion ( Intravenous New Bag/Given 05/10/22 1118)  ondansetron (ZOFRAN) tablet 4 mg (has no administration in  time range)    Or  ondansetron (ZOFRAN) injection 4 mg (has no administration in time range)  traMADol (ULTRAM) tablet 100 mg (100 mg Oral Given 05/10/22 1134)  gabapentin (NEURONTIN) capsule 600 mg (600 mg Oral Given 05/10/22 1134)  pantoprazole (PROTONIX) EC tablet 40 mg (40 mg Oral Given 05/10/22 1134)  morphine (PF) 2 MG/ML injection 2 mg (2 mg Intravenous Given 05/10/22 0809)  ondansetron (ZOFRAN) injection 4 mg (4 mg Intravenous Given 05/10/22 0809)  iohexol (OMNIPAQUE) 300 MG/ML solution 100 mL (100 mLs Intravenous Contrast Given 05/10/22 0909)  sodium chloride 0.9 % bolus 1,000 mL (1,000 mLs Intravenous New Bag/Given 05/10/22 0932)     IMPRESSION / MDM / ASSESSMENT AND PLAN / ED COURSE  I reviewed the triage vital signs and the nursing notes.   Differential diagnosis includes, but is not limited to, hematoma, infection, abscess.  I reviewed the patient's chart.  She underwent left total hip arthroplasty on January 24, and completed 2 weeks of Lovenox prophylaxis, finished on 04/29/2022 at the same time that the staples were removed from the surgical site.  She was seen in the emergency department on 05/03/2022 at which time her H&H was 28.8 and 9.1, and she underwent CT left hip which revealed 13.7 cm hematoma along the posterior lateral aspect of the left hip and proximal thigh, as well as a hyperdense periprosthetic fluid collection along the posterior margin of the proximal left femur measuring up to 10 cm, also likely representing a hematoma.  Patient is awake and alert, tachycardic on arrival though  normotensive and afebrile.  Patient did have an episode where her blood pressure dropped to 86 systolic, though this responded well to a small bolus of IV fluids.  Further workup is indicated.  IV was established and labs were obtained.  H&H is improved from prior.  Repeat CAT scan obtained reveals an interval increase in the size of the hemorrhage within the subcutaneous soft tissues now measuring 18.5 x 7.3 x 9.2 cm, previously 13 x 6 x 9 cm.  The secondary hematoma has not changed.  There is no evidence of prosthesis loosening. Compartments are soft and compressible throughout, normal distal pulses, no paresthesias, no evidence of compartment syndrome.  I discussed these findings with orthopedics who recommends admission to the hospitalist service with plans for washout in the operating room tomorrow.  Orthopedics on-call discussed with primary surgeon who agrees with this plan.  Patient is also in agreement with the plan.  Patient's wound dressing was changed by the ER tech prior to going to the floor.  Patient was accepted by the hospitalist Dr. Joylene Igo.   Patient's presentation is most consistent with acute complicated illness / injury requiring diagnostic workup.   Clinical Course as of 05/10/22 1248  Sun May 10, 2022  1610 Ortho paged [JP]  [phone] Admit, likely OR tomorrow [JP]  1010 Ortho at bedside [JP]    Clinical Course User Index [JP] Daire Okimoto, Herb Grays, PA-C     FINAL CLINICAL IMPRESSION(S) / ED DIAGNOSES   Final diagnoses:  Hematoma     Rx / DC Orders   ED Discharge Orders     None        Note:  This document was prepared using Dragon voice recognition software and may include unintentional dictation errors.   Keturah Shavers 05/10/22 1248    Concha Se, MD 05/11/22 [phone]

## 2022-05-10 NOTE — Consult Note (Signed)
ORTHOPAEDIC CONSULTATION  REQUESTING PHYSICIAN: Vanessa Macungie, MD  Chief Complaint:   Wound drainage/hematoma status post left total hip arthroplasty.  History of Present Illness: Gina Matthews is a 72 y.o. female with multiple medical problems including hypertension, fibromyalgia, gastroesophageal reflux disease, and depression who is now 3.5 weeks status post a left total hip arthroplasty performed by Dr. Marry Guan.  The patient initially did well, but returned to the operating room 2.5 weeks after surgery complaining of some bleeding from her wound.  A CT scan at that time demonstrated 2 areas of hematoma, the larger of which was 13 cm.  The case was discussed with Dr. Marry Guan who advised starting her on an oral antibiotic and close follow-up.  The patient followed up in our office the next day where Steri-Strips were applied over the area of drainage and the Keflex continued.  However, the patient awoke this morning with increased bleeding/drainage from her wound and return to the emergency room.  A repeat CT scan demonstrated the larger hematoma was now 18 cm in diameter.  Therefore, the patient will be admitted to the hospitalist in preparation for formal irrigation and debridement of the wound by Dr. Marry Guan tomorrow.  The patient has been off her Lovenox for at least 10 days.  She denies any fevers or chills, and denies any numbness or paresthesias down her leg to her foot.  Past Medical History:  Diagnosis Date   Abnormal EKG    Anemia    Arthritis    Depression    Fibromyalgia    GERD (gastroesophageal reflux disease)    History of hiatal hernia    Hypertension    Scoliosis    had trouble getting spinal in for TKA in 2022   Tachycardia    Past Surgical History:  Procedure Laterality Date   CESAREAN SECTION     x 4   COSMETIC SURGERY     lip injury   FRACTURE SURGERY Right    foot ORIF-age 29   HERNIA REPAIR Left     inguinal   KNEE ARTHROPLASTY Right 01/27/2021   Procedure: COMPUTER ASSISTED TOTAL KNEE ARTHROPLASTY;  Surgeon: Dereck Leep, MD;  Location: [facility] ORS;  Service: Orthopedics;  Laterality: Right;   MOUTH SURGERY     implants   TONSILLECTOMY     TOTAL HIP ARTHROPLASTY Left 04/15/2022   Procedure: TOTAL HIP ARTHROPLASTY;  Surgeon: Dereck Leep, MD;  Location: [facility] ORS;  Service: Orthopedics;  Laterality: Left;   Social History   Socioeconomic History   Marital status: Widowed    Spouse name: Not on file   Number of children: Not on file   Years of education: Not on file   Highest education level: Not on file  Occupational History   Not on file  Tobacco Use   Smoking status: Never   Smokeless tobacco: Never  Vaping Use   Vaping Use: Never used  Substance and Sexual Activity   Alcohol use: Never   Drug use: Never   Sexual activity: Not on file  Other Topics Concern   Not on file  Social History Narrative   Not on file   Social Determinants of Health   Financial Resource Strain: Not on file  Food Insecurity: No Food Insecurity (04/15/2022)   Hunger Vital Sign    Worried About Running Out of Food in the Last Year: Never true    Ran Out of Food in the Last Year: Never true  Transportation Needs: No Transportation Needs (  04/15/2022)   PRAPARE - Hydrologist (Medical): No    Lack of Transportation (Non-Medical): No  Physical Activity: Not on file  Stress: Not on file  Social Connections: Not on file   Family History  Problem Relation Age of Onset   Breast cancer Neg Hx    No Known Allergies Prior to Admission medications   Medication Sig Start Date End Date Taking? Authorizing Provider  acetaminophen (TYLENOL) 500 MG tablet Take 1,000 mg by mouth 2 (two) times daily.    [provider]  alendronate (FOSAMAX) 70 MG tablet Take 70 mg by mouth once a week. Take with a full glass of water on an empty stomach. Wednesdays    [provider]  amLODipine (NORVASC) 5 MG tablet Take 5 mg by mouth every evening. 10/31/20   [provider]  b complex vitamins capsule Take 1 capsule by mouth daily.    [provider]  Biotin 10000 MCG TABS Take 10,000 mcg by mouth daily.    [provider]  carvedilol (COREG) 6.25 MG tablet Take 6.25 mg by mouth 2 (two) times daily. 12/05/20   [provider]  celecoxib (CELEBREX) 200 MG capsule Take 1 capsule (200 mg total) by mouth 2 (two) times daily. 04/16/22   Reche Dixon, PA-C  cephALEXin (KEFLEX) 500 MG capsule Take 1 capsule (500 mg total) by mouth 3 (three) times daily. 05/03/22   [name], MD  cetirizine (ZYRTEC) 10 MG tablet Take 10 mg by mouth daily as needed for allergies.    [provider]  DULoxetine HCl 60 MG CSDR Take 60 mg by mouth every morning. 05/21/16   [provider]  enoxaparin (LOVENOX) 40 MG/0.4ML injection Inject 0.4 mLs (40 mg total) into the skin daily for 14 days. 04/16/22 04/30/22  Reche Dixon, PA-C  Ferrous Sulfate (SLOW FE PO) Take 1 tablet by mouth daily.    [provider]  gabapentin (NEURONTIN) 600 MG tablet Take 600-1,200 mg by mouth See admin instructions. Take 1200 mg in the morning and 600 mg noon and 1200 mg at bedtime 04/03/16   [provider]  ibuprofen (ADVIL) 200 MG tablet Take 400 mg by mouth 2 (two) times daily.    [provider]  lisinopril (ZESTRIL) 40 MG tablet Take 40 mg by mouth every morning. 12/18/20   [provider]  Multiple Vitamins-Minerals (MULTIVITAMIN WITH MINERALS) tablet Take 1 tablet by mouth daily. Senior    [provider]  oxyCODONE (OXY IR/ROXICODONE) 5 MG immediate release tablet Take 1 tablet (5 mg total) by mouth every 4 (four) hours as needed for moderate pain (pain score 4-6). 04/16/22   Reche Dixon, PA-C  traMADol (ULTRAM) 50 MG tablet Take 1-2 tablets (50-100 mg total) by mouth every 4 (four) hours as needed for moderate  pain. 04/16/22   Reche Dixon, PA-C  traZODone (DESYREL) 50 MG tablet Take 50 mg by mouth at bedtime. 05/18/16   [provider]  Vitamin D3 (VITAMIN D) 25 MCG tablet Take 1,000 Units by mouth daily.    [provider]   CT Hip Left W and/or Wo Contrast  Result Date: 05/10/2022 CLINICAL DATA:  Soft tissue mass. Concern for expanding postop hematoma. EXAM: CT OF THE LOWER LEFT EXTREMITY WITHOUT CONTRAST TECHNIQUE: Multidetector CT imaging of the lower left extremity was performed following the standard protocol before and during bolus administration of intravenous contrast. RADIATION DOSE REDUCTION: This exam was performed according to  the departmental dose-optimization program which includes automated exposure control, adjustment of the mA and/or kV according to patient size and/or use of iterative reconstruction technique. CONTRAST:  131m OMNIPAQUE IOHEXOL 300 MG/ML  SOLN COMPARISON:  CT hip, 05/03/2022. FINDINGS: Bones/Joint/Cartilage No fracture. Left total hip arthroplasty appears well seated and aligned. No findings to suggest infection or prosthesis loosening. No convincing hip joint effusion. Ligaments Suboptimally assessed by CT. Muscles and Tendons No evidence of a muscle injury.  Tendons grossly intact. Soft tissues Hyperattenuating irregular fluid collection lateral soft tissues, extending from the skin at the level of the assist in line centrally to the muscular fascia overlying the proximal femur. Collection currently measures 18.5 cm superior to inferior by 9.2 x 7.3 cm transversely, increased in size from the prior exam where it measured 13.7 x 6.5 x 9.3 cm. The mixed attenuation fluid collection along the posterior margin the proximal femur is similar, spanning 7.9 cm superior to inferior by 5.5 x 3.2 cm transversely, previously 10 x 3.0 x 5 cm. No new fluid collection.  No evidence of active hemorrhage. Partly imaged uterus with evidence of a heterogeneous presumed fibroid  measuring 6.3 cm, unchanged. IMPRESSION: 1. Mild interval increase in the size of the hemorrhage within the subcutaneous soft tissues lateral to the left proximal femur arthroplasty. Hematoma currently measures 18.5 x 7.3 x 9.2 cm, previously 13.7 x 6.5 x 9.3 cm. Smaller area of hemorrhage/fluid along the posterior margin of the proximal femur is without significant change, currently measuring 7.9 x 3.2 x 5.5 cm. 2. No new collections.  No evidence of prosthesis loosening. Electronically Signed   By: DLajean ManesM.D.   On: 05/10/2022 09:44    Positive ROS: All other systems have been reviewed and were otherwise negative with the exception of those mentioned in the HPI and as above.  Physical Exam: General:  Alert, no acute distress Psychiatric:  Patient is competent for consent with normal mood and affect   Cardiovascular:  No pedal edema Respiratory:  No wheezing, non-labored breathing GI:  Abdomen is soft and non-tender Skin:  No lesions in the area of chief complaint Neurologic:  Sensation intact distally Lymphatic:  No axillary or cervical lymphadenopathy  Orthopedic Exam:  Orthopedic examination is limited to the left hip and lower extremity.  The surgical wound does look swollen and there is moderate erythema around the wound, as well as a moderate amount of serosanguineous drainage from the wound.  She has mild tenderness to palpation around the incision site.  She is neurovascularly intact to the left lower extremity and foot.  X-rays:  A recent CT scan of the left hip is available for review and has been reviewed by myself.  The findings are as described above.  Assessment: Postoperative hematoma with wound drainage status post left total hip arthroplasty.  Plan: The treatment options have been discussed with the patient, as well as with Dr. HMarry Guan  He would like to have the patient admitted to the hospitalist service and we will plan on taking her to the operating room tomorrow  afternoon for a formal irrigation and debridement of her wound.  Therefore, the patient will need to be n.p.o. after midnight.  The patient may continue her oral antibiotics at this time.  Thank you for asking me to participate in the care of this most pleasant yet unfortunate woman.  Dr. HMarry Guanwill be happy to follow her with you beginning tomorrow.   JPascal Lux MD  Beeper #:  [phone]  05/10/2022 10:16 AM

## 2022-05-10 NOTE — Assessment & Plan Note (Signed)
Continue PPI ?

## 2022-05-10 NOTE — Assessment & Plan Note (Deleted)
Patient was hypotensive upon presentation Continue to hold lisinopril, carvedilol and amlodipine IV fluid hydration

## 2022-05-10 NOTE — Progress Notes (Incomplete)
       CROSS COVER NOTE  NAME: Diandria Mas MRN: [id number] DOB : [birth year] ATTENDING PHYSICIAN: Collier Bullock, MD    Date of Service   05/10/2022   HPI/Events of Note   BP 119/69 HR 108 NPO after midnight Patient requesting reduction in rate so she willn ot have to get up to go to restroom  Started for hypotension  Interventions   Assessment/Plan: 75 mL/hr X X    *** professional thanks      To reach the provider On-Call:   7AM- 7PM see care teams to locate the attending and reach out to them via [email or web address] Password: [email or web address] 7PM-7AM contact night-coverage If you still have difficulty reaching the appropriate provider, please page the [facility] (Director on Call) for [facility] on amion for assistance  This document was prepared using Systems analyst and may include unintentional dictation errors.  Neomia Glass DNP, MBA, FNP-BC, PMHNP-BC Nurse Practitioner [facility] Pager [phone]

## 2022-05-10 NOTE — ED Notes (Signed)
Patient transported to CT 

## 2022-05-10 NOTE — Assessment & Plan Note (Addendum)
With recent hip surgery.  Patient went to the OR 2/19 for evacuation of hematoma.  Today's hemoglobin 9.1.  Patient will go home with a wound VAC.   -PT is recommending home health.

## 2022-05-10 NOTE — Assessment & Plan Note (Signed)
Continue duloxetine and trazodone 

## 2022-05-10 NOTE — ED Triage Notes (Addendum)
Pt to ED via POV from home. Pt reports 3-4 wks post-op from total hip replacement. Pt reports she was here last week on Sunday due to bleeding from steri-strips but no pain. Pt saw ortho on Monday and reinforced incision with steri- strips. Pt reports incision is bleeding bright red blood again and is now having increased pain in left hip. Pt denies injury or hearing/feeling any pop.

## 2022-05-10 NOTE — H&P (Signed)
History and Physical    Patient: Gina Matthews [id number] DOB: 12/09/1950 DOA: 05/10/2022 DOS: the patient was seen and examined on 05/10/2022 PCP: Barbaraann Boys, MD  Patient coming from: Home  Chief Complaint:  Chief Complaint  Patient presents with   Hip Pain   HPI: Gina Matthews is a 72 y.o. female with medical history significant for fibromyalgia, hypertension, GERD, depression, status post left total hip arthroplasty which was done on 04/15/22 who presents to the emergency room for evaluation of bleeding from her incision site. Patient was on Lovenox for DVT prophylaxis for 2 weeks and has since completed the course.  She was seen in the emergency room on 05/03/22 for evaluation of bloody drainage from her left hip and was discharged home to follow-up with her orthopedic surgeon.  She states that she followed up with the surgeon as an outpatient and her incision was reinforced with Steri-Strips.  She notes that she did well until about 3 days prior to this admission when she started oozing again from the wound.  On the morning of her admission she notes that her clothes and bedding's were saturated with blood which prompted her visit to the ER.  She complains of pain and swelling in her left thigh as well as difficulty with ambulation due to severe pain. She denies having any fever, no chills, no cough, no chest pain, no shortness of breath, no headache, no dizziness, no lightheadedness, no abdominal pain, no changes in her bowel habits, no urinary symptoms, no blurred vision, no focal deficit. She has been taking ibuprofen for pain control and denies being on any other anticoagulants. Upon arrival to the ER she had a blood pressure of 86/54 that responded to IV fluid resuscitation Hemoglobin is 10.2 and appears stable CT scan of the left lower extremity shows mild interval increase in the size of the hemorrhage within the subcutaneous soft tissues lateral to the left proximal  femur arthroplasty. Hematoma currently measures 18.5 x 7.3 x 9.2 cm, previously 13.7 x 6.5 x 9.3 cm. Smaller area of hemorrhage/fluid along the posterior margin of the proximal femur is without significant change, currently measuring 7.9 x 3.2 x 5.5 cm. No new collections.  No evidence of prosthesis loosening. She has been seen in consultation by orthopedic surgery with plans for irrigation and debridement of the wound in a.m. She will be admitted to the hospital for further evaluation    Review of Systems: As mentioned in the history of present illness. All other systems reviewed and are negative. Past Medical History:  Diagnosis Date   Abnormal EKG    Anemia    Arthritis    Depression    Fibromyalgia    GERD (gastroesophageal reflux disease)    History of hiatal hernia    Hypertension    Scoliosis    had trouble getting spinal in for TKA in 2022   Tachycardia    Past Surgical History:  Procedure Laterality Date   CESAREAN SECTION     x 4   COSMETIC SURGERY     lip injury   FRACTURE SURGERY Right    foot ORIF-age 79   HERNIA REPAIR Left    inguinal   KNEE ARTHROPLASTY Right 01/27/2021   Procedure: COMPUTER ASSISTED TOTAL KNEE ARTHROPLASTY;  Surgeon: Dereck Leep, MD;  Location: [facility] ORS;  Service: Orthopedics;  Laterality: Right;   MOUTH SURGERY     implants   TONSILLECTOMY     TOTAL HIP ARTHROPLASTY Left 04/15/2022   Procedure:  TOTAL HIP ARTHROPLASTY;  Surgeon: Dereck Leep, MD;  Location: [facility] ORS;  Service: Orthopedics;  Laterality: Left;   Social History:  reports that she has never smoked. She has never used smokeless tobacco. She reports that she does not drink alcohol and does not use drugs.  No Known Allergies  Family History  Problem Relation Age of Onset   Breast cancer Neg Hx     Prior to Admission medications   Medication Sig Start Date End Date Taking? Authorizing Provider  acetaminophen (TYLENOL) 500 MG tablet Take 1,000 mg by mouth 2 (two) times  daily.    [provider]  alendronate (FOSAMAX) 70 MG tablet Take 70 mg by mouth once a week. Take with a full glass of water on an empty stomach. Wednesdays    [provider]  amLODipine (NORVASC) 5 MG tablet Take 5 mg by mouth every evening. 10/31/20   [provider]  b complex vitamins capsule Take 1 capsule by mouth daily.    [provider]  Biotin 10000 MCG TABS Take 10,000 mcg by mouth daily.    [provider]  carvedilol (COREG) 6.25 MG tablet Take 6.25 mg by mouth 2 (two) times daily. 12/05/20   [provider]  celecoxib (CELEBREX) 200 MG capsule Take 1 capsule (200 mg total) by mouth 2 (two) times daily. 04/16/22   Reche Dixon, PA-C  cephALEXin (KEFLEX) 500 MG capsule Take 1 capsule (500 mg total) by mouth 3 (three) times daily. 05/03/22   [name], MD  cetirizine (ZYRTEC) 10 MG tablet Take 10 mg by mouth daily as needed for allergies.    [provider]  DULoxetine HCl 60 MG CSDR Take 60 mg by mouth every morning. 05/21/16   [provider]  enoxaparin (LOVENOX) 40 MG/0.4ML injection Inject 0.4 mLs (40 mg total) into the skin daily for 14 days. 04/16/22 04/30/22  Reche Dixon, PA-C  Ferrous Sulfate (SLOW FE PO) Take 1 tablet by mouth daily.    [provider]  gabapentin (NEURONTIN) 600 MG tablet Take 600-1,200 mg by mouth See admin instructions. Take 1200 mg in the morning and 600 mg noon and 1200 mg at bedtime 04/03/16   [provider]  ibuprofen (ADVIL) 200 MG tablet Take 400 mg by mouth 2 (two) times daily.    [provider]  lisinopril (ZESTRIL) 40 MG tablet Take 40 mg by mouth every morning. 12/18/20   [provider]  Multiple Vitamins-Minerals (MULTIVITAMIN WITH MINERALS) tablet Take 1 tablet by mouth daily. Senior    [provider]  oxyCODONE (OXY IR/ROXICODONE) 5 MG immediate release tablet Take 1 tablet (5 mg total) by mouth every 4 (four) hours as needed  for moderate pain (pain score 4-6). 04/16/22   Reche Dixon, PA-C  traMADol (ULTRAM) 50 MG tablet Take 1-2 tablets (50-100 mg total) by mouth every 4 (four) hours as needed for moderate pain. 04/16/22   Reche Dixon, PA-C  traZODone (DESYREL) 50 MG tablet Take 50 mg by mouth at bedtime. 05/18/16   [provider]  Vitamin D3 (VITAMIN D) 25 MCG tablet Take 1,000 Units by mouth daily.    [provider]    Physical Exam: Vitals:   05/10/22 0900 05/10/22 0958 05/10/22 1029 05/10/22 1046  BP: (!) 86/54 111/69 (!) 115/57   Pulse: 90 94 95   Resp:  18 18   Temp:    98.3 F (36.8 C)  TempSrc:    Oral  SpO2: 100%  100% 99%   Weight:      Height:       Physical Exam Vitals and nursing note reviewed.  Constitutional:      Appearance: Normal appearance.  HENT:     Head: Normocephalic and atraumatic.     Nose: Nose normal.     Mouth/Throat:     Mouth: Mucous membranes are moist.  Eyes:     Comments: Pale conjunctiva  Cardiovascular:     Rate and Rhythm: Normal rate and regular rhythm.  Pulmonary:     Effort: Pulmonary effort is normal.     Breath sounds: Normal breath sounds.  Abdominal:     General: Abdomen is flat. Bowel sounds are normal.     Palpations: Abdomen is soft.  Musculoskeletal:        General: Swelling present.     Cervical back: Normal range of motion and neck supple.     Comments: Decreased range of motion left hip.  Swollen left thigh  Skin:    General: Skin is warm and dry.  Neurological:     General: No focal deficit present.     Mental Status: She is alert and oriented to person, place, and time.  Psychiatric:        Mood and Affect: Mood normal.        Behavior: Behavior normal.     Data Reviewed: Relevant notes from primary care and specialist visits, past discharge summaries as available in EHR, including Care Everywhere. Prior diagnostic testing as pertinent to current admission diagnoses Updated medications and problem lists for  reconciliation ED course, including vitals, labs, imaging, treatment and response to treatment Triage notes, nursing and pharmacy notes and ED provider's notes Notable results as noted in HPI Labs reviewed.  White count 10.5, hemoglobin 10.2, hematocrit 32.4, platelet count 384, sodium 133, potassium 3.8, chloride 106, bicarb 16, glucose 68, BUN 26, creatinine 0.98, calcium 8.1, total protein 5.7, albumin 2.7, AST 26, ALT 17, alkaline phosphatase 69, total bilirubin 0.6, PT 16.0, INR 1.3 There are no new results to review at this time.  Assessment and Plan: * Hematoma of left hip Patient presents to the ER for evaluation of increased bloody drainage from her left hip incision. She was on Lovenox for DVT prophylaxis which has since been discontinued but is on ibuprofen for pain control most likely resulting in platelet inhibition and increased bleeding from her incision site. Check serial H&H and transfuse as needed Orthopedic surgery consult  Major depressive disorder, recurrent, moderate (HCC) Continue duloxetine and trazodone  Hypertension, essential, benign Patient was hypotensive upon presentation Hold lisinopril, carvedilol and amlodipine IV fluid hydration  Gastroesophageal reflux disease with hiatal hernia Continue PPI      Advance Care Planning:   Code Status: Full Code   Consults: Orthopedic surgery  Family Communication: Greater than 50% of time was spent discussing patient's condition and plan of care with her at the bedside.  All questions and concerns have been addressed.  She verbalizes understanding and agrees with the plan.  Severity of Illness: The appropriate patient status for this patient is INPATIENT. Inpatient status is judged to be reasonable and necessary in order to provide the required intensity of service to ensure the patient's safety. The patient's presenting symptoms, physical exam findings, and initial radiographic and laboratory data in the context  of their chronic comorbidities is felt to place them at high risk for further clinical deterioration. Furthermore, it is not anticipated that the patient will be medically  stable for discharge from the hospital within 2 midnights of admission.   * I certify that at the point of admission it is my clinical judgment that the patient will require inpatient hospital care spanning beyond 2 midnights from the point of admission due to high intensity of service, high risk for further deterioration and high frequency of surveillance required.*  Author: Collier Bullock, MD 05/10/2022 10:57 AM  For on call review [email or web address].

## 2022-05-10 NOTE — ED Notes (Signed)
Advised nurse that patient has ready bed 

## 2022-05-11 ENCOUNTER — Encounter
Admission: EM | Disposition: A | Discharge status: Skilled Nursing Facility | Payer: Self-pay | Source: Home / Self Care | Attending: Internal Medicine

## 2022-05-11 ENCOUNTER — Inpatient Hospital Stay: Payer: Medicare Other | Admitting: Certified Registered"

## 2022-05-11 ENCOUNTER — Encounter: Payer: Self-pay | Admitting: Internal Medicine

## 2022-05-11 ENCOUNTER — Other Ambulatory Visit: Payer: Self-pay

## 2022-05-11 DIAGNOSIS — E876 Hypokalemia: Secondary | ICD-10-CM | POA: Diagnosis not present

## 2022-05-11 DIAGNOSIS — S7002XS Contusion of left hip, sequela: Secondary | ICD-10-CM

## 2022-05-11 DIAGNOSIS — F331 Major depressive disorder, recurrent, moderate: Secondary | ICD-10-CM

## 2022-05-11 DIAGNOSIS — I959 Hypotension, unspecified: Secondary | ICD-10-CM

## 2022-05-11 DIAGNOSIS — E663 Overweight: Secondary | ICD-10-CM

## 2022-05-11 DIAGNOSIS — K219 Gastro-esophageal reflux disease without esophagitis: Secondary | ICD-10-CM

## 2022-05-11 DIAGNOSIS — I9589 Other hypotension: Secondary | ICD-10-CM | POA: Diagnosis not present

## 2022-05-11 DIAGNOSIS — K449 Diaphragmatic hernia without obstruction or gangrene: Secondary | ICD-10-CM

## 2022-05-11 HISTORY — PX: INCISION AND DRAINAGE: SHX5863

## 2022-05-11 HISTORY — PX: APPLICATION OF WOUND VAC: SHX5189

## 2022-05-11 LAB — BASIC METABOLIC PANEL
Anion gap: 7 (ref 5–15)
BUN: 24 mg/dL — ABNORMAL HIGH (ref 8–23)
CO2: 18 mmol/L — ABNORMAL LOW (ref 22–32)
Calcium: 7.6 mg/dL — ABNORMAL LOW (ref 8.9–10.3)
Chloride: 111 mmol/L (ref 98–111)
Creatinine, Ser: 0.67 mg/dL (ref 0.44–1.00)
GFR, Estimated: >60 mL/min (ref >60)
Glucose, Bld: 85 mg/dL (ref 70–99)
Potassium: 3.4 mmol/L — ABNORMAL LOW (ref 3.5–5.1)
Sodium: 136 mmol/L (ref 135–145)

## 2022-05-11 LAB — HEMOGLOBIN AND HEMATOCRIT, BLOOD
HCT: 28.6 % — ABNORMAL LOW (ref 36.0–46.0)
Hemoglobin: 9 g/dL — ABNORMAL LOW (ref 12.0–15.0)

## 2022-05-11 LAB — HIV ANTIBODY (ROUTINE TESTING W REFLEX): HIV Screen 4th Generation wRfx: NONREACTIVE

## 2022-05-11 SURGERY — INCISION AND DRAINAGE
Anesthesia: General | Site: Hip | Laterality: Left

## 2022-05-11 MED ORDER — 0.9 % SODIUM CHLORIDE (POUR BTL) OPTIME
TOPICAL | Status: DC | PRN
  Administered 2022-05-11: 1000 mL

## 2022-05-11 MED ORDER — METOPROLOL TARTRATE 5 MG/5ML IV SOLN
INTRAVENOUS | Status: DC | PRN
  Administered 2022-05-11 (×3): 1 mg via INTRAVENOUS

## 2022-05-11 MED ORDER — MENTHOL 3 MG MT LOZG: 1.0000 | LOZENGE | OROMUCOSAL | Status: DC | PRN

## 2022-05-11 MED ORDER — PROPOFOL 10 MG/ML IV BOLUS
INTRAVENOUS | Status: AC
  Filled 2022-05-11: qty 20

## 2022-05-11 MED ORDER — SENNOSIDES-DOCUSATE SODIUM 8.6-50 MG PO TABS
1.0000 | ORAL_TABLET | Freq: Two times a day (BID) | ORAL | Status: DC
  Administered 2022-05-11 – 2022-05-13 (×4): 1 via ORAL
  Filled 2022-05-11 (×8): qty 1

## 2022-05-11 MED ORDER — SODIUM CHLORIDE 0.9 % IV SOLN: INTRAVENOUS | Status: DC

## 2022-05-11 MED ORDER — CEFAZOLIN SODIUM-DEXTROSE 2-3 GM-%(50ML) IV SOLR
INTRAVENOUS | Status: DC | PRN
  Administered 2022-05-11: 2 g via INTRAVENOUS

## 2022-05-11 MED ORDER — LACTATED RINGERS IV SOLN: INTRAVENOUS | Status: DC | PRN

## 2022-05-11 MED ORDER — PANTOPRAZOLE SODIUM 40 MG PO TBEC
40.0000 mg | DELAYED_RELEASE_TABLET | Freq: Two times a day (BID) | ORAL | Status: DC
  Administered 2022-05-11 – 2022-05-15 (×8): 40 mg via ORAL
  Filled 2022-05-11 (×8): qty 1

## 2022-05-11 MED ORDER — OXYCODONE HCL 5 MG/5ML PO SOLN: 5.0000 mg | Freq: Once | ORAL | Status: DC | PRN

## 2022-05-11 MED ORDER — KETAMINE HCL 50 MG/5ML IJ SOSY
PREFILLED_SYRINGE | INTRAMUSCULAR | Status: AC
  Filled 2022-05-11: qty 5

## 2022-05-11 MED ORDER — TRANEXAMIC ACID-NACL 1000-0.7 MG/100ML-% IV SOLN
INTRAVENOUS | Status: AC
  Filled 2022-05-11: qty 100

## 2022-05-11 MED ORDER — FENTANYL CITRATE (PF) 100 MCG/2ML IJ SOLN
INTRAMUSCULAR | Status: AC
  Administered 2022-05-11: 50 ug via INTRAVENOUS
  Filled 2022-05-11: qty 2

## 2022-05-11 MED ORDER — ONDANSETRON HCL 4 MG PO TABS: 4.0000 mg | ORAL_TABLET | Freq: Four times a day (QID) | ORAL | Status: DC | PRN

## 2022-05-11 MED ORDER — DEXAMETHASONE SODIUM PHOSPHATE 10 MG/ML IJ SOLN
INTRAMUSCULAR | Status: AC
  Filled 2022-05-11: qty 1

## 2022-05-11 MED ORDER — FENTANYL CITRATE (PF) 100 MCG/2ML IJ SOLN
INTRAMUSCULAR | Status: AC
  Filled 2022-05-11: qty 2

## 2022-05-11 MED ORDER — PROPOFOL 10 MG/ML IV BOLUS
INTRAVENOUS | Status: DC | PRN
  Administered 2022-05-11: 150 mg via INTRAVENOUS

## 2022-05-11 MED ORDER — LIDOCAINE HCL (CARDIAC) PF 100 MG/5ML IV SOSY
PREFILLED_SYRINGE | INTRAVENOUS | Status: DC | PRN
  Administered 2022-05-11: 100 mg via INTRAVENOUS

## 2022-05-11 MED ORDER — CEFAZOLIN SODIUM-DEXTROSE 2-4 GM/100ML-% IV SOLN
2.0000 g | Freq: Three times a day (TID) | INTRAVENOUS | Status: AC
  Administered 2022-05-11 – 2022-05-12 (×2): 2 g via INTRAVENOUS
  Filled 2022-05-11 (×2): qty 100

## 2022-05-11 MED ORDER — CELECOXIB 200 MG PO CAPS
200.0000 mg | ORAL_CAPSULE | Freq: Two times a day (BID) | ORAL | Status: DC
  Administered 2022-05-11 – 2022-05-15 (×8): 200 mg via ORAL
  Filled 2022-05-11 (×8): qty 1

## 2022-05-11 MED ORDER — TRANEXAMIC ACID-NACL 1000-0.7 MG/100ML-% IV SOLN
1000.0000 mg | Freq: Once | INTRAVENOUS | Status: AC
  Administered 2022-05-11: 1000 mg via INTRAVENOUS

## 2022-05-11 MED ORDER — SODIUM CHLORIDE 0.9 % IR SOLN
Status: DC | PRN
  Administered 2022-05-11 (×2): 3000 mL

## 2022-05-11 MED ORDER — LIDOCAINE HCL (PF) 2 % IJ SOLN
INTRAMUSCULAR | Status: AC
  Filled 2022-05-11: qty 5

## 2022-05-11 MED ORDER — ACETAMINOPHEN 325 MG PO TABS
325.0000 mg | ORAL_TABLET | Freq: Four times a day (QID) | ORAL | Status: DC | PRN
  Administered 2022-05-14: 650 mg via ORAL
  Filled 2022-05-11: qty 2

## 2022-05-11 MED ORDER — ONDANSETRON HCL 4 MG/2ML IJ SOLN
INTRAMUSCULAR | Status: AC
  Filled 2022-05-11: qty 2

## 2022-05-11 MED ORDER — HYDROMORPHONE HCL 1 MG/ML IJ SOLN
INTRAMUSCULAR | Status: DC | PRN
  Administered 2022-05-11: 1 mg via INTRAVENOUS

## 2022-05-11 MED ORDER — HYDROMORPHONE HCL 1 MG/ML IJ SOLN
0.5000 mg | INTRAMUSCULAR | Status: DC | PRN
  Administered 2022-05-15: 1 mg via INTRAVENOUS
  Filled 2022-05-11: qty 1

## 2022-05-11 MED ORDER — TRANEXAMIC ACID-NACL 1000-0.7 MG/100ML-% IV SOLN
INTRAVENOUS | Status: DC | PRN
  Administered 2022-05-11: 1000 mg via INTRAVENOUS

## 2022-05-11 MED ORDER — KETAMINE HCL 10 MG/ML IJ SOLN
INTRAMUSCULAR | Status: DC | PRN
  Administered 2022-05-11: 30 mg via INTRAVENOUS

## 2022-05-11 MED ORDER — HYDROMORPHONE HCL 1 MG/ML IJ SOLN
INTRAMUSCULAR | Status: AC
  Filled 2022-05-11: qty 1

## 2022-05-11 MED ORDER — FLEET ENEMA 7-19 GM/118ML RE ENEM: 1.0000 | ENEMA | Freq: Once | RECTAL | Status: DC | PRN

## 2022-05-11 MED ORDER — FENTANYL CITRATE (PF) 100 MCG/2ML IJ SOLN
25.0000 ug | INTRAMUSCULAR | Status: DC | PRN
  Administered 2022-05-11 (×2): 50 ug via INTRAVENOUS

## 2022-05-11 MED ORDER — SUCCINYLCHOLINE CHLORIDE 200 MG/10ML IV SOSY
PREFILLED_SYRINGE | INTRAVENOUS | Status: AC
  Filled 2022-05-11: qty 10

## 2022-05-11 MED ORDER — ACETAMINOPHEN 10 MG/ML IV SOLN
1000.0000 mg | Freq: Four times a day (QID) | INTRAVENOUS | Status: AC
  Administered 2022-05-11 – 2022-05-12 (×3): 1000 mg via INTRAVENOUS
  Filled 2022-05-11 (×3): qty 100

## 2022-05-11 MED ORDER — ACETAMINOPHEN 10 MG/ML IV SOLN
1000.0000 mg | Freq: Once | INTRAVENOUS | Status: AC
  Administered 2022-05-11: 1000 mg via INTRAVENOUS

## 2022-05-11 MED ORDER — SEVOFLURANE IN SOLN
RESPIRATORY_TRACT | Status: AC
  Filled 2022-05-11: qty 250

## 2022-05-11 MED ORDER — CEFAZOLIN SODIUM-DEXTROSE 2-4 GM/100ML-% IV SOLN
INTRAVENOUS | Status: AC
  Filled 2022-05-11: qty 100

## 2022-05-11 MED ORDER — METOCLOPRAMIDE HCL 5 MG PO TABS
10.0000 mg | ORAL_TABLET | Freq: Three times a day (TID) | ORAL | Status: AC
  Administered 2022-05-11 – 2022-05-13 (×8): 10 mg via ORAL
  Filled 2022-05-11 (×8): qty 2

## 2022-05-11 MED ORDER — TRAMADOL HCL 50 MG PO TABS
50.0000 mg | ORAL_TABLET | ORAL | Status: DC | PRN
  Administered 2022-05-13 (×2): 100 mg via ORAL
  Filled 2022-05-11 (×2): qty 2

## 2022-05-11 MED ORDER — BISACODYL 10 MG RE SUPP: 10.0000 mg | Freq: Every day | RECTAL | Status: DC | PRN

## 2022-05-11 MED ORDER — POTASSIUM CHLORIDE 10 MEQ/100ML IV SOLN: 10.0000 meq | INTRAVENOUS | Status: DC

## 2022-05-11 MED ORDER — ASPIRIN 81 MG PO CHEW
81.0000 mg | CHEWABLE_TABLET | Freq: Two times a day (BID) | ORAL | Status: DC
  Administered 2022-05-12 – 2022-05-15 (×7): 81 mg via ORAL
  Filled 2022-05-11 (×7): qty 1

## 2022-05-11 MED ORDER — OXYCODONE HCL 5 MG PO TABS
10.0000 mg | ORAL_TABLET | ORAL | Status: DC | PRN
  Administered 2022-05-11 – 2022-05-15 (×15): 10 mg via ORAL
  Filled 2022-05-11 (×16): qty 2

## 2022-05-11 MED ORDER — NEOMYCIN-POLYMYXIN B GU 40-200000 IR SOLN
Status: DC | PRN
  Administered 2022-05-11: 16 mL
  Administered 2022-05-11: 12 mL

## 2022-05-11 MED ORDER — MAGNESIUM HYDROXIDE 400 MG/5ML PO SUSP
30.0000 mL | Freq: Every day | ORAL | Status: DC
  Administered 2022-05-12 – 2022-05-13 (×2): 30 mL via ORAL
  Filled 2022-05-11 (×5): qty 30

## 2022-05-11 MED ORDER — ONDANSETRON HCL 4 MG/2ML IJ SOLN: 4.0000 mg | Freq: Four times a day (QID) | INTRAMUSCULAR | Status: DC | PRN

## 2022-05-11 MED ORDER — DEXMEDETOMIDINE HCL IN NACL 80 MCG/20ML IV SOLN
INTRAVENOUS | Status: DC | PRN
  Administered 2022-05-11 (×2): 12 ug via BUCCAL
  Administered 2022-05-11: 4 ug via BUCCAL

## 2022-05-11 MED ORDER — DIPHENHYDRAMINE HCL 12.5 MG/5ML PO ELIX: 12.5000 mg | ORAL_SOLUTION | ORAL | Status: DC | PRN

## 2022-05-11 MED ORDER — SUCCINYLCHOLINE CHLORIDE 200 MG/10ML IV SOSY
PREFILLED_SYRINGE | INTRAVENOUS | Status: DC | PRN
  Administered 2022-05-11: 100 mg via INTRAVENOUS

## 2022-05-11 MED ORDER — OXYCODONE HCL 5 MG PO TABS: 5.0000 mg | ORAL_TABLET | Freq: Once | ORAL | Status: DC | PRN

## 2022-05-11 MED ORDER — DEXAMETHASONE SODIUM PHOSPHATE 10 MG/ML IJ SOLN
INTRAMUSCULAR | Status: DC | PRN
  Administered 2022-05-11: 10 mg via INTRAVENOUS

## 2022-05-11 MED ORDER — OXYCODONE HCL 5 MG PO TABS
5.0000 mg | ORAL_TABLET | ORAL | Status: DC | PRN
  Administered 2022-05-11 – 2022-05-12 (×2): 5 mg via ORAL
  Filled 2022-05-11 (×2): qty 1

## 2022-05-11 MED ORDER — PHENOL 1.4 % MT LIQD: 1.0000 | OROMUCOSAL | Status: DC | PRN

## 2022-05-11 MED ORDER — POTASSIUM CHLORIDE 10 MEQ/100ML IV SOLN
10.0000 meq | INTRAVENOUS | Status: AC
  Administered 2022-05-11 (×2): 10 meq via INTRAVENOUS
  Filled 2022-05-11 (×2): qty 100

## 2022-05-11 MED ORDER — ALUM & MAG HYDROXIDE-SIMETH 200-200-20 MG/5ML PO SUSP: 30.0000 mL | ORAL | Status: DC | PRN

## 2022-05-11 MED ORDER — FENTANYL CITRATE (PF) 100 MCG/2ML IJ SOLN
INTRAMUSCULAR | Status: DC | PRN
  Administered 2022-05-11 (×2): 50 ug via INTRAVENOUS

## 2022-05-11 SURGICAL SUPPLY — 52 items
BLADE SAW 90X25X1.19 OSCILLAT (BLADE) ×1 IMPLANT
CANISTER WOUND CARE 500ML ATS (WOUND CARE) IMPLANT
DRAPE 3/4 80X56 (DRAPES) ×1 IMPLANT
DRAPE INCISE IOBAN 66X60 STRL (DRAPES) ×1 IMPLANT
DRESSING PEEL AND PLAC PRVNA20 (GAUZE/BANDAGES/DRESSINGS) IMPLANT
DRSG DERMACEA 8X12 NADH (GAUZE/BANDAGES/DRESSINGS) IMPLANT
DRSG MEPILEX SACRM 8.7X9.8 (GAUZE/BANDAGES/DRESSINGS) ×1 IMPLANT
DRSG OPSITE POSTOP 4X12 (GAUZE/BANDAGES/DRESSINGS) ×1 IMPLANT
DRSG OPSITE POSTOP 4X14 (GAUZE/BANDAGES/DRESSINGS) IMPLANT
DRSG PEEL AND PLACE PREVENA 20 (GAUZE/BANDAGES/DRESSINGS) ×1
DRSG TEGADERM 4X4.75 (GAUZE/BANDAGES/DRESSINGS) ×1 IMPLANT
DRSG TELFA 3X4 N-ADH STERILE (GAUZE/BANDAGES/DRESSINGS) ×1 IMPLANT
DURAPREP 26ML APPLICATOR (WOUND CARE) ×2 IMPLANT
ELECT CAUTERY BLADE 6.4 (BLADE) ×1 IMPLANT
ELECT REM PT RETURN 9FT ADLT (ELECTROSURGICAL) ×1
ELECTRODE REM PT RTRN 9FT ADLT (ELECTROSURGICAL) ×1 IMPLANT
GLOVE BIOGEL M STRL SZ7.5 (GLOVE) ×2 IMPLANT
GLOVE SRG 8 PF TXTR STRL LF DI (GLOVE) ×1 IMPLANT
GLOVE SURG UNDER POLY LF SZ8 (GLOVE) ×1
GOWN STRL REUS W/ TWL LRG LVL3 (GOWN DISPOSABLE) ×2 IMPLANT
GOWN STRL REUS W/TWL LRG LVL3 (GOWN DISPOSABLE) ×2
GOWN TOGA ZIPPER T7+ PEEL AWAY (MISCELLANEOUS) ×1 IMPLANT
HEMOVAC 400CC 10FR (MISCELLANEOUS) ×1 IMPLANT
HOLDER FOLEY CATH W/STRAP (MISCELLANEOUS) ×1 IMPLANT
IV NS IRRIG 3000ML ARTHROMATIC (IV SOLUTION) ×1 IMPLANT
KIT PEG BOARD PINK (KITS) ×1 IMPLANT
KIT PUMP PREVENA PLUS 14DAY (MISCELLANEOUS) IMPLANT
KIT TURNOVER KIT A (KITS) ×1 IMPLANT
MANIFOLD NEPTUNE II (INSTRUMENTS) ×2 IMPLANT
NDL SAFETY ECLIP 18X1.5 (MISCELLANEOUS) ×1 IMPLANT
NS IRRIG 1000ML POUR BTL (IV SOLUTION) ×1 IMPLANT
PACK HIP PROSTHESIS (MISCELLANEOUS) ×1 IMPLANT
PULSAVAC PLUS IRRIG FAN TIP (DISPOSABLE) ×1
SOL PREP POV-IOD 4OZ 10% (MISCELLANEOUS) IMPLANT
SOL PREP PVP 2OZ (MISCELLANEOUS) ×1
SOLUTION IRRIG SURGIPHOR (IV SOLUTION) ×1 IMPLANT
SOLUTION PREP PVP 2OZ (MISCELLANEOUS) ×1 IMPLANT
SPONGE DRAIN TRACH 4X4 STRL 2S (GAUZE/BANDAGES/DRESSINGS) ×1 IMPLANT
STAPLER SKIN PROX 35W (STAPLE) ×1 IMPLANT
SUT ETHIBOND #5 BRAIDED 30INL (SUTURE) ×1 IMPLANT
SUT VIC AB 0 CT1 36 (SUTURE) ×1 IMPLANT
SUT VIC AB 1 CT1 36 (SUTURE) ×2 IMPLANT
SUT VIC AB 2-0 CT1 27 (SUTURE) ×1
SUT VIC AB 2-0 CT1 TAPERPNT 27 (SUTURE) ×1 IMPLANT
SYR 20ML LL LF (SYRINGE) ×1 IMPLANT
TAPE CLOTH 3X10 WHT NS LF (GAUZE/BANDAGES/DRESSINGS) ×1 IMPLANT
TAPE TRANSPORE STRL 2 31045 (GAUZE/BANDAGES/DRESSINGS) ×1 IMPLANT
TIP FAN IRRIG PULSAVAC PLUS (DISPOSABLE) ×1 IMPLANT
TOWEL OR 17X26 4PK STRL BLUE (TOWEL DISPOSABLE) IMPLANT
TRAP FLUID SMOKE EVACUATOR (MISCELLANEOUS) ×1 IMPLANT
TRAY FOLEY MTR SLVR 16FR STAT (SET/KITS/TRAYS/PACK) ×1 IMPLANT
WATER STERILE IRR 1000ML POUR (IV SOLUTION) ×1 IMPLANT

## 2022-05-11 NOTE — Anesthesia Procedure Notes (Signed)
Procedure Name: Intubation Date/Time: 05/11/2022 3:13 PM  Performed by: Patience Musca., CRNAPre-anesthesia Checklist: Patient identified, Patient being monitored, Timeout performed, Emergency Drugs available and Suction available Patient Re-evaluated:Patient Re-evaluated prior to induction Oxygen Delivery Method: Circle system utilized Preoxygenation: Pre-oxygenation with 100% oxygen Induction Type: IV induction Ventilation: Mask ventilation without difficulty Laryngoscope Size: 3 and Glidescope Grade View: Grade I Tube type: Oral Tube size: 6.5 mm Number of attempts: 1 Airway Equipment and Method: Stylet Placement Confirmation: ETT inserted through vocal cords under direct vision, positive ETCO2 and breath sounds checked- equal and bilateral Secured at: 18 cm Tube secured with: Tape Dental Injury: Teeth and Oropharynx as per pre-operative assessment

## 2022-05-11 NOTE — Op Note (Signed)
OPERATIVE NOTE  DATE OF SURGERY:  05/11/2022  PATIENT NAME:  Gina Matthews   DOB: 1950-07-29  MRN: [id number]  PRE-OPERATIVE DIAGNOSIS: Hematoma of the left hip status post left total hip arthroplasty  POST-OPERATIVE DIAGNOSIS:  Same  PROCEDURE: Irrigation and debridement/evacuation of hematoma from the left hip  SURGEON:  Marciano Sequin. M.D.  ANESTHESIA: general  ESTIMATED BLOOD LOSS: 200 mL (primarily hematoma)  FLUIDS REPLACED: 900 mL of crystalloid  DRAINS: 2 medium Hemovac (deep and superficial); Prevena wound VAC  INDICATIONS FOR SURGERY: Hampton Scheffler is a 72 y.o. year old female who underwent left total hip arthroplasty approximately 3 weeks ago.  She had been doing well but had the onset of some drainage from the wound site.  This became worse last night and she was admitted to the hospital.  CT scan demonstrated a large hematoma.  After discussion risk benefits of surgical invention, the patient expressed understanding of the risk benefits and agree with plans for evacuation of the left hip hematoma.   The risks, benefits, and alternatives were discussed at length including but not limited to the risks of infection, bleeding, nerve injury, stiffness, blood clots, the need for revision surgery, limb length inequality, dislocation, cardiopulmonary complications, among others, and they were willing to proceed.  PROCEDURE IN DETAIL: The patient was brought into the operating room and, after adequate general anesthesia was achieved, the patient was placed in a right lateral decubitus position. Axillary roll was placed and all bony prominences were well-padded. The patient's left hip was cleaned and prepped with alcohol and Betadine and draped in the usual sterile fashion. A "timeout" was performed as per usual protocol. A lateral curvilinear incision was made gently curving towards the posterior superior iliac spine With the previous surgical incision.  A large hematoma was  encountered in the subcutaneous tissue.  The hematoma was partially liquefied.  This was evacuated and the subcutaneous tissue was irrigated with copious amounts of normal saline with antibiotic solution using pulsatile lavage.  No acute hemorrhage was apparent.. The IT band was incised in line with the skin incision and the fibers of the gluteus maximus were split in line.  There did not appear to be any extension of the hematoma into the deep tissues.  No acute hemorrhage in the deep tissues was noted.  The posterior pseudocapsule was intact.  The wound was irrigated with an additional 3 L of normal saline with antibiotic solution followed by irrigation of 450 mL of Irrisept.  Wound was then rinsed with normal saline.  Again, no active hemorrhaging was appreciated.  A Hemovac drain was placed in the deep wound bed and brought through separate stab incision.  The IT band was reapproximated using interrupted sutures of #1 Vicryl.  A second Hemovac drain was placed in the subcutaneous tissue superficial to the IT band.  The subcutaneous tissue was approximated in layers with interrupted sutures of #0 Vicryl.  Subcutaneous tissue was then approximated using #2-0 Vicryl.  The skin was closed with skin staples.  A Prevena wound VAC was applied.  The patient tolerated the procedure well and was transported to the recovery room in stable condition.   Marciano Sequin., M.D.

## 2022-05-11 NOTE — Assessment & Plan Note (Signed)
BMI 27.46

## 2022-05-11 NOTE — Transfer of Care (Signed)
Immediate Anesthesia Transfer of Care Note  Patient: Gina Matthews  Procedure(s) Performed: INCISION AND DRAINAGE EVACUATION HEMATOMA LEFT HIP (Left: Hip) APPLICATION OF WOUND VAC  [id number] (Left: Hip)  Patient Location: PACU  Anesthesia Type:General  Level of Consciousness: awake, drowsy, and patient cooperative  Airway & Oxygen Therapy: Patient Spontanous Breathing and Patient connected to face mask oxygen  Post-op Assessment: Report given to RN and Post -op Vital signs reviewed and stable  Post vital signs: Reviewed and stable  Last Vitals:  Vitals Value Taken Time  BP 126/80 05/11/22 1739  Temp    Pulse    Resp 21 05/11/22 1744  SpO2    Vitals shown include unvalidated device data.  Last Pain:  Vitals:   05/11/22 1450  TempSrc: Oral  PainSc: 4       Patients Stated Pain Goal: 0 ([id number])  Complications: No notable events documented.

## 2022-05-11 NOTE — Assessment & Plan Note (Signed)
Hold lisinopril Coreg and amlodipine.

## 2022-05-11 NOTE — Hospital Course (Addendum)
72 y.o. female with medical history significant for fibromyalgia, hypertension, GERD, depression, status post left total hip arthroplasty which was done on 04/15/22 who presents to the emergency room for evaluation of bleeding from her incision site. Patient was on Lovenox for DVT prophylaxis for 2 weeks and has since completed the course.  She was seen in the emergency room on 05/03/22 for evaluation of bloody drainage from her left hip and was discharged home to follow-up with her orthopedic surgeon.  She states that she followed up with the surgeon as an outpatient and her incision was reinforced with Steri-Strips.  She notes that she did well until about 3 days prior to this admission when she started oozing again from the wound.  On the morning of her admission she notes that her clothes and bedding's were saturated with blood which prompted her visit to the ER.  She complains of pain and swelling in her left thigh as well as difficulty with ambulation due to severe pain. She denies having any fever, no chills, no cough, no chest pain, no shortness of breath, no headache, no dizziness, no lightheadedness, no abdominal pain, no changes in her bowel habits, no urinary symptoms, no blurred vision, no focal deficit. She has been taking ibuprofen for pain control and denies being on any other anticoagulants. Upon arrival to the ER she had a blood pressure of 86/54 that responded to IV fluid resuscitation Hemoglobin is 10.2 and appears stable CT scan of the left lower extremity shows mild interval increase in the size of the hemorrhage within the subcutaneous soft tissues lateral to the left proximal femur arthroplasty. Hematoma currently measures 18.5 x 7.3 x 9.2 cm, previously 13.7 x 6.5 x 9.3 cm. Smaller area of hemorrhage/fluid along the posterior margin of the proximal femur is without significant change, currently measuring 7.9 x 3.2 x 5.5 cm. No new collections.  No evidence of prosthesis  loosening. She has been seen in consultation by orthopedic surgery with plans for irrigation and debridement of the wound in a.m. She will be admitted to the hospital for further evaluation  2/19.  Okay to have gabapentin and Cymbalta this morning.  Patient going to the operating room today for hematoma evacuation and inspection to see if any bleeding.  2/21: Vitals normal.  S/p incision and drainage and wound VAC placement for left hip hematoma on 05/11/2022.  Patient with still significant pain limiting mobility, she wants to go home to maximum home health services ordered.  Would like to spend another night to keep working with PT. 2/22: Hemodynamically stable but continued to have very limited mobility due to pain and wound VAC in place.  PT changed the recommendations to SNF and patient now agrees. 2/23: Vital stable with mild tachycardia and blood pressure at 141/73, increasing the dose of home carvedilol to 12.5 mg twice daily.  Obtained insurance authorization for her to go to rehab for further management. Patient is being discharged to rehab.  She will need dressing change as needed and emptying of her wound VAC every other day or as needed.  Patient will continue on current medications and need to have a close follow-up with her providers for further recommendations.

## 2022-05-11 NOTE — OR Nursing (Signed)
Prevena PLUS 14 day wound vac system sent with pt to PACU.  Large Opsite dressing inside Prevena box

## 2022-05-11 NOTE — TOC Initial Note (Signed)
Transition of Care [facility]) - Initial/Assessment Note    Patient Details  Name: Gina Matthews MRN: [id number] Date of Birth: Mar 05, 1951  [facility] CM/SW Contact:    Laurena Slimmer, RN Phone Number: 05/11/2022, 11:10 AM  Clinical Narrative:                 Per [place] [facility] patient is active       Patient Goals and CMS Choice            Expected Discharge Plan and Services                                              Prior Living Arrangements/Services                       Activities of Daily Living Home Assistive Devices/Equipment: Cane (specify quad or straight), Walker (specify type) ADL Screening (condition at time of admission) Patient's cognitive ability adequate to safely complete daily activities?: Yes Is the patient deaf or have difficulty hearing?: No Does the patient have difficulty seeing, even when wearing glasses/contacts?: No Does the patient have difficulty concentrating, remembering, or making decisions?: No Patient able to express need for assistance with ADLs?: Yes Does the patient have difficulty dressing or bathing?: No Independently performs ADLs?: Yes (appropriate for developmental age) Does the patient have difficulty walking or climbing stairs?: Yes Weakness of Legs: Left Weakness of Arms/Hands: None  Permission Sought/Granted                  Emotional Assessment              Admission diagnosis:  Hematoma [T14.8XXA] Hematoma of left hip [S70.02XA] Patient Active Problem List   Diagnosis Date Noted   Hematoma of left hip 05/10/2022   Hx of total hip arthroplasty, left 04/15/2022   Primary osteoarthritis of left hip 02/01/2022   History of shingles 02/21/2021   Status post total right knee replacement 01/27/2021   Gastroesophageal reflux disease with hiatal hernia 10/03/2020   Tachycardia 06/28/2020   Major depressive disorder, recurrent, moderate ([place]) 11/23/2019   Age-related  osteoporosis without current pathological fracture 07/17/2019   Abnormal electrocardiogram (ECG) (EKG) 10/01/2018   Dental injury 10/01/2018   Maxillary fracture ([place]) 10/01/2018   Syncope 10/01/2018   Kyphosis of thoracic region 07/30/2017   Chronic midline low back pain without sciatica 07/30/2017   Hyponatremia 11/20/2016   Chronic pain syndrome 12/23/2014   Fibromyalgia muscle pain 12/23/2014   Allergic rhinitis 12/15/2014   Arthritis involving multiple sites 12/15/2014   Chronic insomnia 12/15/2014   Arthritis of both knees 07/31/2014   Seasonal allergies 07/31/2014   Hypertension, essential, benign 06/08/2014   Alcohol dependence in early, early partial, sustained full, or sustained partial remission ([place]) 01/03/2014   History of alcohol dependence ([place]) 01/03/2014   PCP:  Barbaraann Boys, MD Pharmacy:   [facility] [address] [place]) [place] [address] Phone: [phone] Fax: [phone]     Social Determinants of Health (SDOH) Social History: SDOH Screenings   Food Insecurity: No Food Insecurity (05/10/2022)  Housing: Low Risk  (05/10/2022)  Transportation Needs: No Transportation Needs (05/10/2022)  Utilities: Not At Risk (05/10/2022)  Tobacco Use: Low Risk  (05/10/2022)   SDOH Interventions:  Readmission Risk Interventions     No data to display

## 2022-05-11 NOTE — Anesthesia Preprocedure Evaluation (Signed)
Anesthesia Evaluation  Patient identified by MRN, date of birth, ID band Patient awake  General Assessment Comment:  Prior Grade III view with Mcgrath 4 blade, needing use of driveable bougie. Per intubation note: "DL x 2 as pt had anterior, deep airway.  Able to get view of arytenoids and use a drivable bougie to place ETT.  BBS, ETCO2 noted.  O2 sats maintained throughout attempts."  Reviewed: Allergy & Precautions, NPO status , Patient's Chart, lab work & pertinent test results  History of Anesthesia Complications Negative for: history of anesthetic complications  Airway Mallampati: III  TM Distance: >3 FB Neck ROM: Limited  Mouth opening: Limited Mouth Opening  Dental no notable dental hx. (+) Teeth Intact Permanent implants upper and lower :   Pulmonary neg pulmonary ROS   Pulmonary exam normal breath sounds clear to auscultation       Cardiovascular Exercise Tolerance: Poor hypertension, On Medications and On Home Beta Blockers Normal cardiovascular exam Rhythm:Regular Rate:Normal - Systolic murmurs    Neuro/Psych  PSYCHIATRIC DISORDERS  Depression     Neuromuscular disease    GI/Hepatic Neg liver ROS, hiatal hernia,GERD  Medicated and Controlled,,  Endo/Other  negative endocrine ROS    Renal/GU      Musculoskeletal  (+) Arthritis ,  Fibromyalgia -, narcotic dependentScoliosis    Abdominal   Peds  Hematology negative hematology ROS (+)   Anesthesia Other Findings Past Medical History: No date: Abnormal EKG No date: Anemia No date: Arthritis No date: Depression No date: Fibromyalgia No date: GERD (gastroesophageal reflux disease) No date: History of hiatal hernia No date: Hypertension No date: Scoliosis     Comment:  had trouble getting spinal in for TKA in 2022 No date: Tachycardia  Past Surgical History: No date: CESAREAN SECTION     Comment:  x 4 No date: COSMETIC SURGERY     Comment:  lip  injury No date: FRACTURE SURGERY; Right     Comment:  foot ORIF-age 72 No date: HERNIA REPAIR; Left     Comment:  inguinal 01/27/2021: KNEE ARTHROPLASTY; Right     Comment:  Procedure: COMPUTER ASSISTED TOTAL KNEE ARTHROPLASTY;                Surgeon: Dereck Leep, MD;  Location: [facility] ORS;                Service: Orthopedics;  Laterality: Right; No date: MOUTH SURGERY     Comment:  implants No date: TONSILLECTOMY  BMI    Body Mass Index: 27.46 kg/m      Reproductive/Obstetrics negative OB ROS                              Anesthesia Physical Anesthesia Plan  ASA: 2  Anesthesia Plan: General   Post-op Pain Management: Ofirmev IV (intra-op)*, Gabapentin PO (pre-op)* and Ketamine IV*   Induction: Intravenous  PONV Risk Score and Plan: 3 and Ondansetron, Dexamethasone and Midazolam  Airway Management Planned: Oral ETT and Video Laryngoscope Planned  Additional Equipment: None  Intra-op Plan:   Post-operative Plan: Extubation in OR  Informed Consent: I have reviewed the patients History and Physical, chart, labs and discussed the procedure including the risks, benefits and alternatives for the proposed anesthesia with the patient or authorized representative who has indicated his/her understanding and acceptance.     Dental advisory given  Plan Discussed with: CRNA and Surgeon  Anesthesia Plan Comments: (Will plan for  glidescope Discussed risks of anesthesia with patient, including PONV, sore throat, lip/dental/eye damage. Rare risks discussed as well, such as cardiorespiratory and neurological sequelae, and allergic reactions. Discussed the role of CRNA in patient's perioperative care. Patient understands.)        Anesthesia Quick Evaluation

## 2022-05-11 NOTE — TOC Progression Note (Signed)
Transition of Care [facility]) - Progression Note    Patient Details  Name: Gina Matthews MRN: [id number] Date of Birth: 08-26-1950  [facility]) CM/SW Contact  Laurena Slimmer, RN Phone Number: 05/11/2022, 11:39 AM  Clinical Narrative:    Case reviewed for needs and disposition.         Expected Discharge Plan and Services                                               Social Determinants of Health (SDOH) Interventions SDOH Screenings   Food Insecurity: No Food Insecurity (05/10/2022)  Housing: Low Risk  (05/10/2022)  Transportation Needs: No Transportation Needs (05/10/2022)  Utilities: Not At Risk (05/10/2022)  Tobacco Use: Low Risk  (05/10/2022)    Readmission Risk Interventions     No data to display

## 2022-05-11 NOTE — Assessment & Plan Note (Addendum)
Will give 2 potassium runs.  Potassium mildly low at 3.4.

## 2022-05-11 NOTE — Progress Notes (Signed)
Progress Note   Patient: Gina Matthews [id number] DOB: [birth year] DOA: 05/10/2022     1 DOS: the patient was seen and examined on 05/11/2022   Brief hospital course: 72 y.o. female with medical history significant for fibromyalgia, hypertension, GERD, depression, status post left total hip arthroplasty which was done on 04/15/22 who presents to the emergency room for evaluation of bleeding from her incision site. Patient was on Lovenox for DVT prophylaxis for 2 weeks and has since completed the course.  She was seen in the emergency room on 05/03/22 for evaluation of bloody drainage from her left hip and was discharged home to follow-up with her orthopedic surgeon.  She states that she followed up with the surgeon as an outpatient and her incision was reinforced with Steri-Strips.  She notes that she did well until about 3 days prior to this admission when she started oozing again from the wound.  On the morning of her admission she notes that her clothes and bedding's were saturated with blood which prompted her visit to the ER.  She complains of pain and swelling in her left thigh as well as difficulty with ambulation due to severe pain. She denies having any fever, no chills, no cough, no chest pain, no shortness of breath, no headache, no dizziness, no lightheadedness, no abdominal pain, no changes in her bowel habits, no urinary symptoms, no blurred vision, no focal deficit. She has been taking ibuprofen for pain control and denies being on any other anticoagulants. Upon arrival to the ER she had a blood pressure of 86/54 that responded to IV fluid resuscitation Hemoglobin is 10.2 and appears stable CT scan of the left lower extremity shows mild interval increase in the size of the hemorrhage within the subcutaneous soft tissues lateral to the left proximal femur arthroplasty. Hematoma currently measures 18.5 x 7.3 x 9.2 cm, previously 13.7 x 6.5 x 9.3 cm. Smaller area of hemorrhage/fluid along  the posterior margin of the proximal femur is without significant change, currently measuring 7.9 x 3.2 x 5.5 cm. No new collections.  No evidence of prosthesis loosening. She has been seen in consultation by orthopedic surgery with plans for irrigation and debridement of the wound in a.m. She will be admitted to the hospital for further evaluation  2/19.  Okay to have gabapentin and Cymbalta this morning.  Patient going to the operating room today for hematoma evacuation and inspection to see if any bleeding.  Assessment and Plan: * Hematoma of left hip With recent hip surgery.  Patient to go to the OR today for inspection for any bleeding and evacuation of hematoma.  Hemoglobin 9.0.  May end up needing blood during this hospitalization if continues to drop.  Hypotension Hold lisinopril Coreg and amlodipine.  Major depressive disorder, recurrent, moderate (HCC) Continue duloxetine and trazodone  Hypokalemia Will give 2 potassium runs.  Potassium mildly low at 3.4.  Gastroesophageal reflux disease with hiatal hernia Continue PPI  Overweight (BMI 25.0-29.9) BMI 27.46        Subjective: Patient wanted to take her gabapentin and Cymbalta before surgery this morning.  I did okay sips with meds.  Patient states that she has been having quite a bit of bleeding from her hip.Marland Kitchen  Physical Exam: Vitals:   05/10/22 2045 05/11/22 0039 05/11/22 0511 05/11/22 0749  BP: 119/69 (!) 100/56 102/60 104/70  Pulse: (!) 108 91  92  Resp: 20 16  18  $ Temp: 98.3 F (36.8 C) (!) 97.4 F (36.3 C)  98.1 F (36.7 C)  TempSrc: Oral   Oral  SpO2: 100% 98%  100%  Weight:      Height:       Physical Exam HENT:     Head: Normocephalic.     Mouth/Throat:     Pharynx: No oropharyngeal exudate.  Eyes:     General: Lids are normal.     Conjunctiva/sclera: Conjunctivae normal.  Cardiovascular:     Rate and Rhythm: Normal rate and regular rhythm.     Heart sounds: Normal heart sounds, S1 normal and  S2 normal.  Pulmonary:     Breath sounds: No decreased breath sounds, wheezing, rhonchi or rales.  Abdominal:     Palpations: Abdomen is soft.     Tenderness: There is no abdominal tenderness.  Musculoskeletal:     Right lower leg: No swelling.     Left lower leg: No swelling.  Skin:    General: Skin is warm.     Findings: No rash.     Comments: Bleeding through the packing on her left hip this morning.  Neurological:     Mental Status: She is alert and oriented to person, place, and time.     Data Reviewed: Hemoglobin 9.0, creatinine 0.67, potassium 3.4.  Sodium 136.  Family Communication: Left message for patient's daughter  Disposition: Status is: Inpatient Remains inpatient appropriate because: To go to the operating room today.  Planned Discharge Destination: Home with Home Health    Time spent: 28 minutes  Author: Loletha Grayer, MD 05/11/2022 2:00 PM  For on call review [email or web address].

## 2022-05-11 NOTE — Anesthesia Postprocedure Evaluation (Signed)
Anesthesia Post Note  Patient: Gina Matthews  Procedure(s) Performed: INCISION AND DRAINAGE EVACUATION HEMATOMA LEFT HIP (Left: Hip) APPLICATION OF WOUND VAC  [id number] (Left: Hip)  Patient location during evaluation: PACU Anesthesia Type: General Level of consciousness: awake and alert Pain management: pain level controlled Vital Signs Assessment: post-procedure vital signs reviewed and stable Respiratory status: spontaneous breathing, nonlabored ventilation, respiratory function stable and patient connected to nasal cannula oxygen Cardiovascular status: blood pressure returned to baseline and stable Postop Assessment: no apparent nausea or vomiting Anesthetic complications: no   No notable events documented.   Last Vitals:  Vitals:   05/11/22 1828 05/11/22 1830  BP:  (!) 100/53  Pulse: 86 92  Resp: 13 17  Temp:  36.7 C  SpO2: 94% 94%    Last Pain:  Vitals:   05/11/22 1828  TempSrc:   PainSc: 8                  Precious Haws Nicholous Girgenti

## 2022-05-12 ENCOUNTER — Encounter: Payer: Self-pay | Admitting: Orthopedic Surgery

## 2022-05-12 DIAGNOSIS — E876 Hypokalemia: Secondary | ICD-10-CM | POA: Diagnosis not present

## 2022-05-12 DIAGNOSIS — I9589 Other hypotension: Secondary | ICD-10-CM | POA: Diagnosis not present

## 2022-05-12 DIAGNOSIS — F331 Major depressive disorder, recurrent, moderate: Secondary | ICD-10-CM | POA: Diagnosis not present

## 2022-05-12 DIAGNOSIS — S7002XS Contusion of left hip, sequela: Secondary | ICD-10-CM | POA: Diagnosis not present

## 2022-05-12 MED ORDER — POTASSIUM CHLORIDE CRYS ER 20 MEQ PO TBCR
40.0000 meq | EXTENDED_RELEASE_TABLET | Freq: Once | ORAL | Status: AC
  Administered 2022-05-12: 40 meq via ORAL
  Filled 2022-05-12: qty 2

## 2022-05-12 NOTE — Progress Notes (Signed)
PT refused mobility. Pt expressed "I have transferred back and forth to [facility]. I rather wait for PT."

## 2022-05-12 NOTE — Progress Notes (Signed)
  Subjective: 1 Day Post-Op Procedure(s) (LRB): INCISION AND DRAINAGE EVACUATION HEMATOMA LEFT HIP (Left) APPLICATION OF WOUND VAC  [id number] (Left) Patient reports pain as moderate.   Patient is well, and has had no acute complaints or problems Plan is to go Home after hospital stay. Negative for chest pain and shortness of breath Fever: no Gastrointestinal: Negative for nausea and vomiting  Objective: Vital signs in last 24 hours: Temp:  [97.8 F (36.6 C)-98.8 F (37.1 C)] 97.9 F (36.6 C) (02/20 0306) Pulse Rate:  [81-102] 81 (02/20 0306) Resp:  [12-28] 16 (02/20 0306) BP: (100-133)/(53-80) 101/60 (02/20 0306) SpO2:  [94 %-100 %] 97 % (02/20 0306) Weight:  [70.3 kg] 70.3 kg (02/19 1450)  Intake/Output from previous day:  Intake/Output Summary (Last 24 hours) at 05/12/2022 0656 Last data filed at 05/12/2022 [id number] Gross per 24 hour  Intake 1224.79 ml  Output 880 ml  Net 344.79 ml    Intake/Output this shift: Total I/O In: 174.8 [I.V.:174.8] Out: 580 [Urine:500; Drains:80]  Labs: Recent Labs    05/10/22 0759 05/10/22 1744 05/11/22 0604  HGB 10.2* 9.2* 9.0*   Recent Labs    05/10/22 0759 05/10/22 1744 05/11/22 0604  WBC 10.5  --   --   RBC 3.27*  --   --   HCT 32.4* 29.1* 28.6*  PLT 384  --   --    Recent Labs    05/10/22 0759 05/11/22 0604  NA 133* 136  K 3.8 3.4*  CL 106 111  CO2 16* 18*  BUN 26* 24*  CREATININE 0.98 0.67  GLUCOSE 68* 85  CALCIUM 8.1* 7.6*   Recent Labs    05/10/22 0759  INR 1.3*     EXAM General - Patient is Alert and Oriented Extremity - Neurovascular intact Sensation intact distally Dorsiflexion/Plantar flexion intact Compartment soft Dressing/Incision - clean, dry, with the wound VAC in place.  The Hemovac tubing is intact with 200 cc drainage last night. Motor Function - intact, moving foot and toes well on exam.   Past Medical History:  Diagnosis Date   Abnormal EKG    Anemia    Arthritis    Depression     Fibromyalgia    GERD (gastroesophageal reflux disease)    History of hiatal hernia    Hypertension    Scoliosis    had trouble getting spinal in for TKA in 2022   Tachycardia     Assessment/Plan: 1 Day Post-Op Procedure(s) (LRB): INCISION AND DRAINAGE EVACUATION HEMATOMA LEFT HIP (Left) APPLICATION OF WOUND VAC  [id number] (Left) Principal Problem:   Hematoma of left hip Active Problems:   Gastroesophageal reflux disease with hiatal hernia   Hypertension, essential, benign   Major depressive disorder, recurrent, moderate (HCC)   Overweight (BMI 25.0-29.9)   Hypokalemia   Hypotension  Estimated body mass index is 27.45 kg/m as calculated from the following:   Height as of this encounter: 5' 3"$  (1.6 m).   Weight as of this encounter: 70.3 kg. Advance diet Up with therapy  DVT Prophylaxis - Aspirin, Foot Pumps, and TED hose Weight-Bearing as tolerated to left leg  Reche Dixon, PA-C Orthopaedic Surgery 05/12/2022, 6:56 AM

## 2022-05-12 NOTE — Clinical Note (Incomplete)
ED TO INPATIENT HANDOFF REPORT  ED Nurse Name and Phone #: ***  S Name/Age/Gender Gina Matthews 72 y.o. female Room/Bed: 132A/132A-BB  Code Status   Code Status: Full Code  Home/SNF/Other {Discharge Destination:18313::"Home"} {Patient oriented to:22149:::1} Is this baseline? {YES/NO:21197}  Triage Complete: Triage complete  Chief Complaint Hematoma [T14.8XXA] Hematoma of left hip [S70.02XA]  Triage Note Pt to ED via POV from home. Pt reports 3-4 wks post-op from total hip replacement. Pt reports she was here last week on Sunday due to bleeding from steri-strips but no pain. Pt saw ortho on Monday and reinforced incision with steri- strips. Pt reports incision is bleeding bright red blood again and is now having increased pain in left hip. Pt denies injury or hearing/feeling any pop.    Allergies No Known Allergies  Level of Care/Admitting Diagnosis ED Disposition     ED Disposition  Admit   Condition  --   Comment  Hospital Area: [facility] [[id number]]  Level of Care: Telemetry Medical [104]  Covid Evaluation: Asymptomatic - no recent exposure (last 10 days) testing not required  Diagnosis: Hematoma of left hip [id number]  Admitting Physician: Gary Fleet  Attending Physician: AGBATA, TOCHUKWU [id number]  Certification:: I certify this patient will need inpatient services for at least 2 midnights  Estimated Length of Stay: 2          B Medical/Surgery History Past Medical History:  Diagnosis Date   Abnormal EKG    Anemia    Arthritis    Depression    Fibromyalgia    GERD (gastroesophageal reflux disease)    History of hiatal hernia    Hypertension    Scoliosis    had trouble getting spinal in for TKA in 2022   Tachycardia    Past Surgical History:  Procedure Laterality Date   APPLICATION OF WOUND VAC Left 05/11/2022   Procedure: APPLICATION OF WOUND VAC  [id number];  Surgeon: Dereck Leep, MD;  Location: [facility] ORS;   Service: Orthopedics;  Laterality: Left;  [id number]   CESAREAN SECTION     x 4   COSMETIC SURGERY     lip injury   FRACTURE SURGERY Right    foot ORIF-age 7   HERNIA REPAIR Left    inguinal   INCISION AND DRAINAGE Left 05/11/2022   Procedure: INCISION AND DRAINAGE EVACUATION HEMATOMA LEFT HIP;  Surgeon: Dereck Leep, MD;  Location: [facility] OR[facility];  Service: Orthopedics;  Laterality: Left;   KNEE ARTHROPLASTY Right 01/27/2021   Procedure: COMPUTER ASSISTED TOTAL KNEE ARTHROPLASTY;  Surgeon: Dereck Leep, MD;  Location: [facility] ORS;  Service: Orthopedics;  Laterality: Right;   MOUTH SURGERY     implants   TONSILLECTOMY     TOTAL HIP ARTHROPLASTY Left 04/15/2022   Procedure: TOTAL HIP ARTHROPLASTY;  Surgeon: Dereck Leep, MD;  Location: [facility] ORS;  Service: Orthopedics;  Laterality: Left;     A IV Location/Drains/Wounds Patient Lines/Drains/Airways Status     Active Line/Drains/Airways     Name Placement date Placement time Site Days   Peripheral IV 05/11/22 20 G Left;Posterior Wrist 05/11/22  1452  Wrist  1   Closed System Drain 1 Left;Posterior Hip Accordion (Hemovac) 10 Fr. 05/11/22  1631  Hip  1   Negative Pressure Wound Therapy Hip Left;Lateral 05/11/22  1700  --  1   Incision (Closed) 05/10/22 Thigh Left;Lateral 05/10/22  --  -- 2   Incision (Closed) 05/11/22 Hip Left 05/11/22  1629  -- 1  Incision (Closed) 05/11/22 Hip Left 05/11/22  1642  -- 1            Intake/Output Last 24 hours  Intake/Output Summary (Last 24 hours) at 05/12/2022 0918 Last data filed at 05/12/2022 [id number] Gross per 24 hour  Intake 1224.79 ml  Output 880 ml  Net 344.79 ml    Labs/Imaging Results for orders placed or performed during the hospital encounter of 05/10/22 (from the past 48 hour(s))  Hemoglobin and hematocrit, blood     Status: Abnormal   Collection Time: 05/10/22  5:44 PM  Result Value Ref Range   Hemoglobin 9.2 (L) 12.0 - 15.0 g/dL   HCT 29.1 (L) 36.0 - 46.0 %    Comment:  Performed at [facility], [address] [place], [place] [address]  Hemoglobin and hematocrit, blood     Status: Abnormal   Collection Time: 05/11/22  6:04 AM  Result Value Ref Range   Hemoglobin 9.0 (L) 12.0 - 15.0 g/dL   HCT 28.6 (L) 36.0 - 46.0 %    Comment: Performed at [facility], [address] [place], [place] [address]  HIV Antibody (routine testing w rflx)     Status: None   Collection Time: 05/11/22  6:04 AM  Result Value Ref Range   HIV Screen 4th Generation wRfx Non Reactive Non Reactive    Comment: Performed at [facility], [place] [address], [place], [place] [id number]  Basic metabolic panel     Status: Abnormal   Collection Time: 05/11/22  6:04 AM  Result Value Ref Range   Sodium 136 135 - 145 mmol/L   Potassium 3.4 (L) 3.5 - 5.1 mmol/L   Chloride 111 98 - 111 mmol/L   CO2 18 (L) 22 - 32 mmol/L   Glucose, Bld 85 70 - 99 mg/dL    Comment: Glucose reference range applies only to samples taken after fasting for at least 8 hours.   BUN 24 (H) 8 - 23 mg/dL   Creatinine, Ser 0.67 0.44 - 1.00 mg/dL   Calcium 7.6 (L) 8.9 - 10.3 mg/dL   GFR, Estimated >60 >60 mL/min    Comment: (NOTE) Calculated using the CKD-EPI Creatinine Equation (2021)    Anion gap 7 5 - 15    Comment: Performed at [facility], [address], [place], [place] [address]   CT Hip Left W and/or Wo Contrast  Result Date: 05/10/2022 CLINICAL DATA:  Soft tissue mass. Concern for expanding postop hematoma. EXAM: CT OF THE LOWER LEFT EXTREMITY WITHOUT CONTRAST TECHNIQUE: Multidetector CT imaging of the lower left extremity was performed following the standard protocol before and during bolus administration of intravenous contrast. RADIATION DOSE REDUCTION: This exam was performed according to the departmental dose-optimization program which includes automated exposure control, adjustment of the mA and/or kV according to patient size and/or use of iterative reconstruction  technique. CONTRAST:  181m OMNIPAQUE IOHEXOL 300 MG/ML  SOLN COMPARISON:  CT hip, 05/03/2022. FINDINGS: Bones/Joint/Cartilage No fracture. Left total hip arthroplasty appears well seated and aligned. No findings to suggest infection or prosthesis loosening. No convincing hip joint effusion. Ligaments Suboptimally assessed by CT. Muscles and Tendons No evidence of a muscle injury.  Tendons grossly intact. Soft tissues Hyperattenuating irregular fluid collection lateral soft tissues, extending from the skin at the level of the assist in line centrally to the muscular fascia overlying the proximal femur. Collection currently measures 18.5 cm superior to inferior by 9.2 x 7.3 cm transversely, increased in  size from the prior exam where it measured 13.7 x 6.5 x 9.3 cm. The mixed attenuation fluid collection along the posterior margin the proximal femur is similar, spanning 7.9 cm superior to inferior by 5.5 x 3.2 cm transversely, previously 10 x 3.0 x 5 cm. No new fluid collection.  No evidence of active hemorrhage. Partly imaged uterus with evidence of a heterogeneous presumed fibroid measuring 6.3 cm, unchanged. IMPRESSION: 1. Mild interval increase in the size of the hemorrhage within the subcutaneous soft tissues lateral to the left proximal femur arthroplasty. Hematoma currently measures 18.5 x 7.3 x 9.2 cm, previously 13.7 x 6.5 x 9.3 cm. Smaller area of hemorrhage/fluid along the posterior margin of the proximal femur is without significant change, currently measuring 7.9 x 3.2 x 5.5 cm. 2. No new collections.  No evidence of prosthesis loosening. Electronically Signed   By: Lajean Manes M.D.   On: 05/10/2022 09:44    Pending Labs Unresulted Labs (From admission, onward)    None       Vitals/Pain Today's Vitals   05/12/22 0306 05/12/22 0623 05/12/22 0733 05/12/22 0757  BP: 101/60  102/65 100/60  Pulse: 81  94 86  Resp: 16  16 16  $ Temp: 97.9 F (36.6 C)  97.8 F (36.6 C) 98.7 F (37.1 C)   TempSrc:   Oral Oral  SpO2: 97%  99% 99%  Weight:      Height:      PainSc:  8   6     Isolation Precautions No active isolations  Medications Medications  DULoxetine (CYMBALTA) DR capsule 60 mg (60 mg Oral Given 05/12/22 0649)  traZODone (DESYREL) tablet 50 mg (50 mg Oral Given 05/11/22 2229)  ferrous sulfate tablet 325 mg (325 mg Oral Given 05/12/22 0844)  gabapentin (NEURONTIN) capsule 1,200 mg (1,200 mg Oral Given 05/11/22 2228)  multivitamin with minerals tablet 1 tablet ( Oral MAR Unhold 05/11/22 1850)  cholecalciferol (VITAMIN D3) 25 MCG (1000 UNIT) tablet 1,000 Units ( Oral MAR Unhold 05/11/22 1850)  0.9 %  sodium chloride infusion (0 mLs Intravenous Stopped 05/11/22 0512)  gabapentin (NEURONTIN) capsule 600 mg ( Oral MAR Unhold 05/11/22 1850)  ceFAZolin (ANCEF) 2-4 GM/100ML-% IVPB (has no administration in time range)  0.9 %  sodium chloride infusion ( Intravenous New Bag/Given 05/12/22 0900)  celecoxib (CELEBREX) capsule 200 mg (200 mg Oral Given 05/11/22 2229)  acetaminophen (TYLENOL) tablet 325-650 mg (has no administration in time range)  oxyCODONE (Oxy IR/ROXICODONE) immediate release tablet 5 mg (5 mg Oral Given 05/11/22 2348)  oxyCODONE (Oxy IR/ROXICODONE) immediate release tablet 10 mg (10 mg Oral Given 05/12/22 [id number])  HYDROmorphone (DILAUDID) injection 0.5-1 mg (has no administration in time range)  diphenhydrAMINE (BENADRYL) 12.5 MG/5ML elixir 12.5-25 mg (has no administration in time range)  magnesium hydroxide (MILK OF MAGNESIA) suspension 30 mL (30 mLs Oral Patient Refused/Not Given 05/11/22 1918)  bisacodyl (DULCOLAX) suppository 10 mg (has no administration in time range)  sodium phosphate (FLEET) 7-19 GM/118ML enema 1 enema (has no administration in time range)  ondansetron (ZOFRAN) tablet 4 mg (has no administration in time range)    Or  ondansetron (ZOFRAN) injection 4 mg (has no administration in time range)  alum & mag hydroxide-simeth (MAALOX/MYLANTA) 200-200-20  MG/5ML suspension 30 mL (has no administration in time range)  pantoprazole (PROTONIX) EC tablet 40 mg (40 mg Oral Given 05/11/22 2229)  menthol-cetylpyridinium (CEPACOL) lozenge 3 mg (has no administration in time range)    Or  phenol (CHLORASEPTIC) mouth  spray 1 spray (has no administration in time range)  aspirin chewable tablet 81 mg (has no administration in time range)  acetaminophen (OFIRMEV) IV 1,000 mg (1,000 mg Intravenous New Bag/Given 05/12/22 0656)  metoCLOPramide (REGLAN) tablet 10 mg (10 mg Oral Given 05/12/22 0845)  senna-docusate (Senokot-S) tablet 1 tablet (1 tablet Oral Given 05/11/22 2229)  traMADol (ULTRAM) tablet 50-100 mg (has no administration in time range)  fentaNYL (SUBLIMAZE) 100 MCG/2ML injection (has no administration in time range)  morphine (PF) 2 MG/ML injection 2 mg (2 mg Intravenous Given 05/10/22 0809)  ondansetron (ZOFRAN) injection 4 mg (4 mg Intravenous Given 05/10/22 0809)  iohexol (OMNIPAQUE) 300 MG/ML solution 100 mL (100 mLs Intravenous Contrast Given 05/10/22 0909)  sodium chloride 0.9 % bolus 1,000 mL (0 mLs Intravenous Stopping previously hung infusion 05/10/22 1045)  tranexamic acid (CYKLOKAPRON) 1000MG/116m IVPB (  Override pull for Anesthesia 05/11/22 1523)  acetaminophen (OFIRMEV) IV 1,000 mg (0 mg Intravenous Stopped 05/11/22 1807)  ceFAZolin (ANCEF) IVPB 2g/100 mL premix (2 g Intravenous New Bag/Given 05/12/22 0651)  tranexamic acid (CYKLOKAPRON) IVPB 1,000 mg (1,000 mg Intravenous New Bag/Given 05/11/22 1808)  potassium chloride 10 mEq in 100 mL IVPB (0 mEq Intravenous Stopped 05/11/22 2320)  potassium chloride SA (KLOR-CON M) CR tablet 40 mEq (40 mEq Oral Given 05/12/22 0847)    Mobility {Mobility:20148}     Focused Assessments {ED Handoff Assessments:22153}   R Recommendations: See Admitting Provider Note  Report given to:   Additional Notes: ***

## 2022-05-12 NOTE — Evaluation (Signed)
Physical Therapy Evaluation Patient Details Name: Gina Matthews MRN: [id number] DOB: Feb 01, 1951 Today's Date: 05/12/2022  History of Present Illness  Pt admitted for L hip hematoma and is POD 1 s/p I&D for evacuation of hematoma. PMH includes fibromyalgia, HTN, GERD, depression and recent L THR on 04/15/22. Currently active with HHPT.  Clinical Impression  Pt is a pleasant 72 year old female who was admitted for removal of hematoma on L hip. Discouraged to be back in the hospital and having pain. Pt performs bed mobility/transfers with cga and RW. Pt reports just recently going to bathroom and is declining additional ambulation this date. Pt demonstrates deficits with pain/mobility. Educated on wound vac and pt able to recite 3/3 hip precautions. Currently active with HHPT. Would benefit from skilled PT to address above deficits and promote optimal return to PLOF. Recommend transition to [facility] upon discharge from acute hospitalization.      Recommendations for follow up therapy are one component of a multi-disciplinary discharge planning process, led by the attending physician.  Recommendations may be updated based on patient status, additional functional criteria and insurance authorization.  Follow Up Recommendations Home health PT      Assistance Recommended at Discharge Set up Supervision/Assistance  Patient can return home with the following  A little help with walking and/or transfers;A little help with bathing/dressing/bathroom;Help with stairs or ramp for entrance    Equipment Recommendations None recommended by PT  Recommendations for Other Services       Functional Status Assessment Patient has had a recent decline in their functional status and demonstrates the ability to make significant improvements in function in a reasonable and predictable amount of time.     Precautions / Restrictions Precautions Precautions: Fall;Posterior Hip Precaution Booklet Issued:  No Restrictions Weight Bearing Restrictions: Yes LLE Weight Bearing: Weight bearing as tolerated      Mobility  Bed Mobility Overal bed mobility: Needs Assistance Bed Mobility: Supine to Sit     Supine to sit: Min guard     General bed mobility comments: needs guidance for L LE secondary to pain. Once seated, upright posture noted    Transfers Overall transfer level: Needs assistance Equipment used: Rolling walker (2 wheels) Transfers: Bed to chair/wheelchair/BSC     Step pivot transfers: Min guard       General transfer comment: safe technique with cues for pushing from seated surface. Only wanted to perform steps over to recliner secondary to pain.    Ambulation/Gait               General Gait Details: declined this date  Stairs            Wheelchair Mobility    Modified Rankin (Stroke Patients Only)       Balance Overall balance assessment: Needs assistance Sitting-balance support: Feet supported Sitting balance-Leahy Scale: Good     Standing balance support: Bilateral upper extremity supported Standing balance-Leahy Scale: Good                               Pertinent Vitals/Pain Pain Assessment Pain Assessment: 0-10 Pain Score: 9  Pain Location: L hip Pain Descriptors / Indicators: Operative site guarding Pain Intervention(s): Limited activity within patient's tolerance, Premedicated before session, Patient requesting pain meds-RN notified, Ice applied    Home Living Family/patient expects to be discharged to:: Private residence Living Arrangements: Alone Available Help at Discharge: Friend(s) Type of Home: House Home Access: Stairs  to enter Entrance Stairs-Rails: Right Entrance Stairs-Number of Steps: 4   Home Layout: One level Home Equipment: Conservation officer, nature (2 wheels);Cane - single point;Shower seat;Grab bars - tub/shower;BSC/3in1 Additional Comments: retired Hotel manager Prior Level of Function :  Independent/Modified Independent             Mobility Comments: using rolling walker for ambulation       Hand Dominance        Extremity/Trunk Assessment   Upper Extremity Assessment Upper Extremity Assessment: Overall WFL for tasks assessed    Lower Extremity Assessment Lower Extremity Assessment: Generalized weakness (L LE grossly 3/5; R LE grossly 4/5)       Communication   Communication: No difficulties  Cognition Arousal/Alertness: Awake/alert Behavior During Therapy: WFL for tasks assessed/performed Overall Cognitive Status: Within Functional Limits for tasks assessed                                          General Comments      Exercises     Assessment/Plan    PT Assessment Patient needs continued PT services  PT Problem List Decreased strength;Decreased balance;Decreased mobility;Pain       PT Treatment Interventions DME instruction;Gait training;Therapeutic exercise    PT Goals (Current goals can be found in the Care Plan section)  Acute Rehab PT Goals Patient Stated Goal: to go home tomorrow PT Goal Formulation: With patient Time For Goal Achievement: 05/26/22 Potential to Achieve Goals: Good    Frequency 7X/week     Co-evaluation               AM-PAC PT "6 Clicks" Mobility  Outcome Measure Help needed turning from your back to your side while in a flat bed without using bedrails?: A Little Help needed moving from lying on your back to sitting on the side of a flat bed without using bedrails?: A Little Help needed moving to and from a bed to a chair (including a wheelchair)?: A Little Help needed standing up from a chair using your arms (e.g., wheelchair or bedside chair)?: A Little Help needed to walk in hospital room?: A Little Help needed climbing 3-5 steps with a railing? : A Little 6 Click Score: 18    End of Session Equipment Utilized During Treatment: Gait belt Activity Tolerance: Patient limited by  pain Patient left: in chair;with chair alarm set Nurse Communication: Mobility status PT Visit Diagnosis: Muscle weakness (generalized) (M62.81);Difficulty in walking, not elsewhere classified (R26.2);Pain Pain - Right/Left: Left Pain - part of body: Hip    Time: [id number] PT Time Calculation (min) (ACUTE ONLY): 22 min   Charges:   PT Evaluation $PT Eval Low Complexity: 1 Low PT Treatments $Therapeutic Activity: 8-22 mins        Greggory Stallion, PT, DPT, GCS [phone]   Jaquon Gingerich 05/12/2022, 11:44 AM

## 2022-05-12 NOTE — Progress Notes (Signed)
Progress Note   Patient: Gina Matthews [id number] DOB: October 14, 1950 DOA: 05/10/2022     2 DOS: the patient was seen and examined on 05/12/2022   Brief hospital course: 72 y.o. female with medical history significant for fibromyalgia, hypertension, GERD, depression, status post left total hip arthroplasty which was done on 04/15/22 who presents to the emergency room for evaluation of bleeding from her incision site. Patient was on Lovenox for DVT prophylaxis for 2 weeks and has since completed the course.  She was seen in the emergency room on 05/03/22 for evaluation of bloody drainage from her left hip and was discharged home to follow-up with her orthopedic surgeon.  She states that she followed up with the surgeon as an outpatient and her incision was reinforced with Steri-Strips.  She notes that she did well until about 3 days prior to this admission when she started oozing again from the wound.  On the morning of her admission she notes that her clothes and bedding's were saturated with blood which prompted her visit to the ER.  She complains of pain and swelling in her left thigh as well as difficulty with ambulation due to severe pain. She denies having any fever, no chills, no cough, no chest pain, no shortness of breath, no headache, no dizziness, no lightheadedness, no abdominal pain, no changes in her bowel habits, no urinary symptoms, no blurred vision, no focal deficit. She has been taking ibuprofen for pain control and denies being on any other anticoagulants. Upon arrival to the ER she had a blood pressure of 86/54 that responded to IV fluid resuscitation Hemoglobin is 10.2 and appears stable CT scan of the left lower extremity shows mild interval increase in the size of the hemorrhage within the subcutaneous soft tissues lateral to the left proximal femur arthroplasty. Hematoma currently measures 18.5 x 7.3 x 9.2 cm, previously 13.7 x 6.5 x 9.3 cm. Smaller area of hemorrhage/fluid along  the posterior margin of the proximal femur is without significant change, currently measuring 7.9 x 3.2 x 5.5 cm. No new collections.  No evidence of prosthesis loosening. She has been seen in consultation by orthopedic surgery with plans for irrigation and debridement of the wound in a.m. She will be admitted to the hospital for further evaluation  2/19.  Okay to have gabapentin and Cymbalta this morning.  Patient going to the operating room today for hematoma evacuation and inspection to see if any bleeding.  Assessment and Plan: * Hematoma of left hip With recent hip surgery.  Patient went to the OR 2/19 for evacuation of hematoma.  Today's hemoglobin 9.0.  Patient will likely go home with a wound VAC.  Orthopedic surgery to evaluate tomorrow to see if the drain will come out.  Hypotension Hold lisinopril Coreg and amlodipine.  Major depressive disorder, recurrent, moderate (HCC) Continue duloxetine and trazodone  Hypokalemia Oral potassium supplementation today  Gastroesophageal reflux disease with hiatal hernia Continue PPI  Overweight (BMI 25.0-29.9) BMI 27.46        Subjective: Patient having some pain in the hip area.  Went to the OR yesterday for hematoma evacuation.  Currently has wound VAC and drain in.  Physical Exam: Vitals:   05/11/22 2305 05/12/22 0306 05/12/22 0733 05/12/22 0757  BP: (!) 101/58 101/60 102/65 100/60  Pulse: 87 81 94 86  Resp: 15 16 16 16  $ Temp: 97.9 F (36.6 C) 97.9 F (36.6 C) 97.8 F (36.6 C) 98.7 F (37.1 C)  TempSrc:   Oral Oral  SpO2: 96% 97% 99% 99%  Weight:      Height:       Physical Exam HENT:     Head: Normocephalic.     Mouth/Throat:     Pharynx: No oropharyngeal exudate.  Eyes:     General: Lids are normal.     Conjunctiva/sclera: Conjunctivae normal.  Cardiovascular:     Rate and Rhythm: Normal rate and regular rhythm.     Heart sounds: Normal heart sounds, S1 normal and S2 normal.  Pulmonary:     Breath sounds:  No decreased breath sounds, wheezing, rhonchi or rales.  Abdominal:     Palpations: Abdomen is soft.     Tenderness: There is no abdominal tenderness.  Musculoskeletal:     Right lower leg: No swelling.     Left lower leg: No swelling.  Skin:    General: Skin is warm.     Findings: No rash.  Neurological:     Mental Status: She is alert and oriented to person, place, and time.     Data Reviewed: Potassium 3.4, CO2 18, hemoglobin 9.0  Family Communication: Updated patient's daughter on the phone  Disposition: Status is: Inpatient Remains inpatient appropriate because: Likely home tomorrow after orthopedic evaluation.  Will likely have drain removed tomorrow and will go home with wound VAC.  Planned Discharge Destination: Home with Home Health    Time spent: 28 minutes  Author: Loletha Grayer, MD 05/12/2022 1:00 PM  For on call review [email or web address].

## 2022-05-12 NOTE — Evaluation (Signed)
Occupational Therapy Evaluation Patient Details Name: Gina Matthews MRN: [id number] DOB: 03/01/1951 Today's Date: 05/12/2022   History of Present Illness Pt admitted for L hip hematoma and is POD 1 s/p I&D for evacuation of hematoma. PMH includes fibromyalgia, HTN, GERD, depression and recent L THR on 04/15/22. Currently active with HHPT.   Clinical Impression   Gina Matthews presents with decreased Ind in self care,balance, functional mobility/transfers, endurance, and safety awareness. Patient lives alone in a single-story home, 3 STE, and is IND in B/IADLs at baseline. During today's evaluation, pt is limited by pain, endorsing 8/10 pain in sitting, increasing to 10/10 with movement. Provided educ re: posterior hip precautions, falls prevention, AD/AE use, with pt verbalizing understanding. She states she has all needed equipment at home, given her L THA in January 2024. Pt requires Mod A for bed mobility, SUPV for sit<>stand and transfers. Pt states she would like to continue with HHPT but that she does not need HHOT post-DC. Can continue to offer OT during hospitalization to assist pt is improving fxl mobility.     Recommendations for follow up therapy are one component of a multi-disciplinary discharge planning process, led by the attending physician.  Recommendations may be updated based on patient status, additional functional criteria and insurance authorization.   Follow Up Recommendations  No OT follow up     Assistance Recommended at Discharge Intermittent Supervision/Assistance  Patient can return home with the following A little help with bathing/dressing/bathroom;Assistance with cooking/housework;Assist for transportation;Help with stairs or ramp for entrance    Functional Status Assessment  Patient has had a recent decline in their functional status and demonstrates the ability to make significant improvements in function in a reasonable and predictable amount of time.  Equipment  Recommendations  None recommended by OT    Recommendations for Other Services       Precautions / Restrictions Precautions Precautions: Fall;Posterior Hip Precaution Booklet Issued: No Restrictions Weight Bearing Restrictions: Yes LLE Weight Bearing: Weight bearing as tolerated      Mobility Bed Mobility Overal bed mobility: Needs Assistance Bed Mobility: Sit to Supine       Sit to supine: Mod assist   General bed mobility comments: Mod A for elevating b/l LE to bed level and for repositioning L LE    Transfers Overall transfer level: Needs assistance Equipment used: Rolling walker (2 wheels) Transfers: Sit to/from Stand, Bed to chair/wheelchair/BSC Sit to Stand: Supervision     Step pivot transfers: Supervision     General transfer comment: close SUPV for safety, 2/2 pain      Balance Overall balance assessment: Needs assistance Sitting-balance support: Feet supported Sitting balance-Leahy Scale: Good     Standing balance support: Bilateral upper extremity supported, Reliant on assistive device for balance, During functional activity Standing balance-Leahy Scale: Good                             ADL either performed or assessed with clinical judgement   ADL Overall ADL's : Needs assistance/impaired Eating/Feeding: Independent               Upper Body Dressing : Supervision/safety   Lower Body Dressing: Maximal assistance               Functional mobility during ADLs: Supervision/safety;Min guard;Rolling walker (2 wheels)       Vision         Perception     Praxis  Pertinent Vitals/Pain Pain Assessment Pain Score: 9  Pain Location: L hip Pain Descriptors / Indicators: Operative site guarding, Aching Pain Intervention(s): Premedicated before session, Repositioned, Ice applied     Hand Dominance Right   Extremity/Trunk Assessment Upper Extremity Assessment Upper Extremity Assessment: Overall WFL for tasks  assessed   Lower Extremity Assessment Lower Extremity Assessment: Generalized weakness       Communication Communication Communication: No difficulties   Cognition Arousal/Alertness: Awake/alert   Overall Cognitive Status: Within Functional Limits for tasks assessed                                 General Comments: very pleasant     General Comments       Exercises Other Exercises Other Exercises: Educ re: posterior precautions, falls prevention, home modifications   Shoulder Instructions      Home Living Family/patient expects to be discharged to:: Private residence Living Arrangements: Alone Available Help at Discharge: Friend(s) Type of Home: House Home Access: Stairs to enter [employer] of Steps: 4 Entrance Stairs-Rails: Right Home Layout: One level     Bathroom Shower/Tub: Walk-in shower         Home Equipment: Conservation officer, nature (2 wheels);Cane - single point;Shower seat;Grab bars - tub/shower;BSC/3in1   Additional Comments: retired Water quality scientist Prior Level of Function : Independent/Modified Independent             Mobility Comments: using rolling walker for ambulation ADLs Comments: IND in B/IADLs        OT Problem List: Decreased strength;Decreased range of motion;Decreased activity tolerance;Impaired balance (sitting and/or standing);Pain      OT Treatment/Interventions: Self-care/ADL training;Therapeutic exercise;Patient/family education;Balance training;Therapeutic activities    OT Goals(Current goals can be found in the care plan section) Acute Rehab OT Goals Patient Stated Goal: to not have to come back to hospital anytime soon OT Goal Formulation: With patient Time For Goal Achievement: 05/26/22 Potential to Achieve Goals: Good  OT Frequency: Min 2X/week    Co-evaluation              AM-PAC OT "6 Clicks" Daily Activity     Outcome Measure Help from another person eating  meals?: None Help from another person taking care of personal grooming?: A Little Help from another person toileting, which includes using toliet, bedpan, or urinal?: A Lot Help from another person bathing (including washing, rinsing, drying)?: A Lot Help from another person to put on and taking off regular upper body clothing?: None Help from another person to put on and taking off regular lower body clothing?: A Lot 6 Click Score: 17   End of Session Equipment Utilized During Treatment: Rolling walker (2 wheels)  Activity Tolerance: Patient tolerated treatment well;Patient limited by pain Patient left: in bed;with call bell/phone within reach;with bed alarm set;with nursing/sitter in room  OT Visit Diagnosis: Unsteadiness on feet (R26.81);Muscle weakness (generalized) (M62.81);Pain                Time: 1051-1104 OT Time Calculation (min): 13 min Charges:  OT General Charges $OT Visit: 1 Visit OT Evaluation $OT Eval Low Complexity: 1 Low OT Treatments $Self Care/Home Management : 8-22 mins Josiah Lobo, PhD, MS, OTR/L 05/12/22, 12:24 PM

## 2022-05-13 DIAGNOSIS — S7002XD Contusion of left hip, subsequent encounter: Secondary | ICD-10-CM | POA: Diagnosis not present

## 2022-05-13 LAB — BASIC METABOLIC PANEL
Anion gap: 5 (ref 5–15)
BUN: 22 mg/dL (ref 8–23)
CO2: 20 mmol/L — ABNORMAL LOW (ref 22–32)
Calcium: 8.1 mg/dL — ABNORMAL LOW (ref 8.9–10.3)
Chloride: 109 mmol/L (ref 98–111)
Creatinine, Ser: 0.66 mg/dL (ref 0.44–1.00)
GFR, Estimated: >60 mL/min (ref >60)
Glucose, Bld: 79 mg/dL (ref 70–99)
Potassium: 4.3 mmol/L (ref 3.5–5.1)
Sodium: 134 mmol/L — ABNORMAL LOW (ref 135–145)

## 2022-05-13 LAB — CBC
HCT: 28.6 % — ABNORMAL LOW (ref 36.0–46.0)
Hemoglobin: 9.1 g/dL — ABNORMAL LOW (ref 12.0–15.0)
MCH: 30.6 pg (ref 26.0–34.0)
MCHC: 31.8 g/dL (ref 30.0–36.0)
MCV: 96.3 fL (ref 80.0–100.0)
Platelets: 319 10*3/uL (ref 150–400)
RBC: 2.97 MIL/uL — ABNORMAL LOW (ref 3.87–5.11)
RDW: 14.5 % (ref 11.5–15.5)
WBC: 5.9 10*3/uL (ref 4.0–10.5)
nRBC: 0.3 % — ABNORMAL HIGH (ref 0.0–0.2)

## 2022-05-13 MED ORDER — OXYCODONE HCL 5 MG PO TABS: 5.0000 mg | ORAL_TABLET | ORAL | 0 refills | Status: DC | PRN

## 2022-05-13 MED ORDER — TRAMADOL HCL 50 MG PO TABS: 50.0000 mg | ORAL_TABLET | ORAL | 0 refills | Status: DC | PRN

## 2022-05-13 NOTE — Progress Notes (Signed)
Progress Note   Patient: Gina Matthews [id number] DOB: 01-26-1951 DOA: 05/10/2022     3 DOS: the patient was seen and examined on 05/13/2022   Brief hospital course: 72 y.o. female with medical history significant for fibromyalgia, hypertension, GERD, depression, status post left total hip arthroplasty which was done on 04/15/22 who presents to the emergency room for evaluation of bleeding from her incision site. Patient was on Lovenox for DVT prophylaxis for 2 weeks and has since completed the course.  She was seen in the emergency room on 05/03/22 for evaluation of bloody drainage from her left hip and was discharged home to follow-up with her orthopedic surgeon.  She states that she followed up with the surgeon as an outpatient and her incision was reinforced with Steri-Strips.  She notes that she did well until about 3 days prior to this admission when she started oozing again from the wound.  On the morning of her admission she notes that her clothes and bedding's were saturated with blood which prompted her visit to the ER.  She complains of pain and swelling in her left thigh as well as difficulty with ambulation due to severe pain. She denies having any fever, no chills, no cough, no chest pain, no shortness of breath, no headache, no dizziness, no lightheadedness, no abdominal pain, no changes in her bowel habits, no urinary symptoms, no blurred vision, no focal deficit. She has been taking ibuprofen for pain control and denies being on any other anticoagulants. Upon arrival to the ER she had a blood pressure of 86/54 that responded to IV fluid resuscitation Hemoglobin is 10.2 and appears stable CT scan of the left lower extremity shows mild interval increase in the size of the hemorrhage within the subcutaneous soft tissues lateral to the left proximal femur arthroplasty. Hematoma currently measures 18.5 x 7.3 x 9.2 cm, previously 13.7 x 6.5 x 9.3 cm. Smaller area of hemorrhage/fluid along  the posterior margin of the proximal femur is without significant change, currently measuring 7.9 x 3.2 x 5.5 cm. No new collections.  No evidence of prosthesis loosening. She has been seen in consultation by orthopedic surgery with plans for irrigation and debridement of the wound in a.m. She will be admitted to the hospital for further evaluation  2/19.  Okay to have gabapentin and Cymbalta this morning.  Patient going to the operating room today for hematoma evacuation and inspection to see if any bleeding.  2/21: Vitals normal.  S/p incision and drainage and wound VAC placement for left hip hematoma on 05/11/2022.  Patient with still significant pain limiting mobility, she wants to go home to maximum home health services ordered.  Would like to spend another night to keep working with PT.  Assessment and Plan: * Hematoma of left hip With recent hip surgery.  Patient went to the OR 2/19 for evacuation of hematoma.  Today's hemoglobin 9.1.  Patient will go home with a wound VAC.   -PT is recommending home health.  Hypotension Blood pressure now within goal. Keep holding lisinopril Coreg and amlodipine.-We will restart as needed  Major depressive disorder, recurrent, moderate (HCC) Continue duloxetine and trazodone  Hypokalemia Oral potassium supplementation today  Gastroesophageal reflux disease with hiatal hernia Continue PPI  Overweight (BMI 25.0-29.9) BMI 27.46  Hypertension, essential, benign Blood pressure within goal.  Initially home antihypertensives were held due to hypotension. -Restart home antihypertensives as needed-currently being held   Subjective: Patient continued to have pain in the left hip.  She  was little frustrated that this pain is more than the pain she experienced after initial surgery.  She wants to go home but needs some help as she lives alone.  Physical Exam: Vitals:   05/12/22 1504 05/12/22 2352 05/13/22 0742 05/13/22 1502  BP: 112/65 116/83 137/84  119/88  Pulse: 89 83 88 (!) 106  Resp: 15 18 17 14  $ Temp: 98.2 F (36.8 C) 97.9 F (36.6 C) 98.1 F (36.7 C) 98.6 F (37 C)  TempSrc: Oral  Oral Oral  SpO2: 97% 98% 100% 97%  Weight:      Height:       General.  Well-developed lady, in no acute distress. Pulmonary.  Lungs clear bilaterally, normal respiratory effort. CV.  Regular rate and rhythm, no JVD, rub or murmur. Abdomen.  Soft, nontender, nondistended, BS positive. CNS.  Alert and oriented .  No focal neurologic deficit. Extremities.  No edema, no cyanosis, pulses intact and symmetrical. Psychiatry.  Judgment and insight appears normal.    Data Reviewed: Prior data reviewed  Family Communication: Updated patient's daughter on the phone  Disposition: Status is: Inpatient Remains inpatient appropriate because: Likely home tomorrow after orthopedic evaluation.  Will likely have drain removed tomorrow and will go home with wound VAC.  Planned Discharge Destination: Home with Home Health  Time spent: 40 minutes  This record has been created using Systems analyst. Errors have been sought and corrected,but may not always be located. Such creation errors do not reflect on the standard of care.   Author: Lorella Nimrod, MD 05/13/2022 3:42 PM  For on call review [email or web address].

## 2022-05-13 NOTE — Progress Notes (Signed)
Physical Therapy Treatment Patient Details Name: Gina Matthews MRN: [id number] DOB: [birth year] Today's Date: 05/13/2022   History of Present Illness Pt admitted for L hip hematoma and is POD 1 s/p I&D for evacuation of hematoma. PMH includes fibromyalgia, HTN, GERD, depression and recent L THR on 04/15/22. Currently active with HHPT.    PT Comments    Pt with marginal improvement this date. Still very pain limited; premedicated prior to session. RW used and pt not able to complete household distances at this time. Would benefit from increased supervision in home environment for support. Discussed with care team on recommendations. Will continue to progress as able.   Recommendations for follow up therapy are one component of a multi-disciplinary discharge planning process, led by the attending physician.  Recommendations may be updated based on patient status, additional functional criteria and insurance authorization.  Follow Up Recommendations  Home health PT     Assistance Recommended at Discharge Intermittent Supervision/Assistance  Patient can return home with the following A little help with walking and/or transfers;A little help with bathing/dressing/bathroom;Help with stairs or ramp for entrance   Equipment Recommendations  None recommended by PT    Recommendations for Other Services       Precautions / Restrictions Precautions Precautions: Fall;Posterior Hip Precaution Booklet Issued: No Restrictions Weight Bearing Restrictions: Yes LLE Weight Bearing: Weight bearing as tolerated     Mobility  Bed Mobility Overal bed mobility: Needs Assistance Bed Mobility: Supine to Sit     Supine to sit: Min assist Sit to supine: Mod assist   General bed mobility comments: needs assist for surgical leg    Transfers Overall transfer level: Needs assistance Equipment used: Rolling walker (2 wheels) Transfers: Sit to/from Stand, Bed to chair/wheelchair/BSC Sit to Stand: Min  guard   Step pivot transfers: Min guard       General transfer comment: cues for hand placement. Very kyphotic posture.    Ambulation/Gait Ambulation/Gait assistance: Min guard Gait Distance (Feet): 20 Feet Assistive device: Rolling walker (2 wheels) Gait Pattern/deviations: Step-through pattern       General Gait Details: ambulated short distance in hallway with RW. Very flexed posture and needs cues for ER during turns. Very SOB and fatigued; limiting further distance. HR at 103bpm and O2 sats at 99% on RA   Stairs             Wheelchair Mobility    Modified Rankin (Stroke Patients Only)       Balance Overall balance assessment: Needs assistance Sitting-balance support: Feet supported Sitting balance-Leahy Scale: Good     Standing balance support: Bilateral upper extremity supported, Reliant on assistive device for balance, During functional activity Standing balance-Leahy Scale: Good                              Cognition Arousal/Alertness: Awake/alert Behavior During Therapy: WFL for tasks assessed/performed Overall Cognitive Status: Within Functional Limits for tasks assessed                                 General Comments: pleasant, however anxious with mobility tasks        Exercises Other Exercises Other Exercises: seated ther-ex performed including LAQ and SAQ. Educated on frequency and duration    General Comments        Pertinent Vitals/Pain Pain Assessment Pain Assessment: 0-10 Pain Score: 6  Pain Location:  L hip Pain Descriptors / Indicators: Operative site guarding, Aching Pain Intervention(s): Limited activity within patient's tolerance, Premedicated before session, Repositioned, Ice applied    Home Living                          Prior Function            PT Goals (current goals can now be found in the care plan section) Acute Rehab PT Goals Patient Stated Goal: to go home tomorrow PT  Goal Formulation: With patient Time For Goal Achievement: 05/26/22 Potential to Achieve Goals: Good Progress towards PT goals: Progressing toward goals    Frequency    7X/week      PT Plan Current plan remains appropriate    Co-evaluation              AM-PAC PT "6 Clicks" Mobility   Outcome Measure  Help needed turning from your back to your side while in a flat bed without using bedrails?: A Little Help needed moving from lying on your back to sitting on the side of a flat bed without using bedrails?: A Little Help needed moving to and from a bed to a chair (including a wheelchair)?: A Little Help needed standing up from a chair using your arms (e.g., wheelchair or bedside chair)?: A Little Help needed to walk in hospital room?: A Little Help needed climbing 3-5 steps with a railing? : A Little 6 Click Score: 18    End of Session Equipment Utilized During Treatment: Gait belt Activity Tolerance: Patient limited by pain Patient left: in bed;with bed alarm set;with SCD's reapplied (declined to sit in chair) Nurse Communication: Mobility status PT Visit Diagnosis: Muscle weakness (generalized) (M62.81);Difficulty in walking, not elsewhere classified (R26.2);Pain Pain - Right/Left: Left Pain - part of body: Hip     Time: [id number] PT Time Calculation (min) (ACUTE ONLY): 23 min  Charges:  $Gait Training: 8-22 mins $Therapeutic Exercise: 8-22 mins                     Greggory Stallion, PT, DPT, GCS [phone]    Davina Howlett 05/13/2022, 1:36 PM

## 2022-05-13 NOTE — Progress Notes (Signed)
Occupational Therapy Treatment Patient Details Name: Gina Matthews MRN: [id number] DOB: Nov 11, 1950 Today's Date: 05/13/2022   History of present illness Pt admitted for L hip hematoma and is POD 1 s/p I&D for evacuation of hematoma. PMH includes fibromyalgia, HTN, GERD, depression and recent L THR on 04/15/22. Currently active with HHPT.   OT comments  Chart reviewed, pt greeted in chair agreeable to OT tx session. Tx session targeted improving functional activity tasks via ADL tasks. Pt performs amb in room with RW with CGA, to bathroom with RW with CGA, toilet transfer with CGA, toileting lateral leans with supervision. Significant discussion re: ADL completion at home (use of bsc near bed, sink baths until she gets stronger, use of AE for LB dressing) if she does not have assist. Pt voices understanding and demonstrates carry over of safe techniques during tx session with intermittent vcs. Discharge recommendation has been updated to [facility], OT will continue to follow acutely.    Recommendations for follow up therapy are one component of a multi-disciplinary discharge planning process, led by the attending physician.  Recommendations may be updated based on patient status, additional functional criteria and insurance authorization.    Follow Up Recommendations  Home health OT     Assistance Recommended at Discharge Intermittent Supervision/Assistance  Patient can return home with the following  A little help with bathing/dressing/bathroom;Assistance with cooking/housework;Assist for transportation;Help with stairs or ramp for entrance   Equipment Recommendations  None recommended by OT    Recommendations for Other Services      Precautions / Restrictions Precautions Precautions: Fall;Posterior Hip Precaution Booklet Issued: No Restrictions Weight Bearing Restrictions: Yes LLE Weight Bearing: Weight bearing as tolerated       Mobility Bed Mobility               General bed  mobility comments: NT in recliner pre/post session    Transfers Overall transfer level: Needs assistance Equipment used: Rolling walker (2 wheels) Transfers: Sit to/from Stand Sit to Stand: Min guard                 Balance Overall balance assessment: Needs assistance Sitting-balance support: Feet supported Sitting balance-Leahy Scale: Good     Standing balance support: Bilateral upper extremity supported, Reliant on assistive device for balance, During functional activity Standing balance-Leahy Scale: Good                             ADL either performed or assessed with clinical judgement   ADL Overall ADL's : Needs assistance/impaired Eating/Feeding: Set up;Sitting                   Lower Body Dressing: Maximal assistance   Toilet Transfer: Min guard;Rolling walker (2 wheels);BSC/3in1 Toilet Transfer Details (indicate cue type and reason): intermittent vcs for technique Toileting- Clothing Manipulation and Hygiene: Sitting/lateral lean;Supervision/safety       Functional mobility during ADLs: Supervision/safety;Min guard;Rolling walker (2 wheels) (approx 15' one attempt with RW, 10' one attempt with Rw)      Extremity/Trunk Assessment              Vision       Perception     Praxis      Cognition Arousal/Alertness: Awake/alert Behavior During Therapy: WFL for tasks assessed/performed Overall Cognitive Status: Within Functional Limits for tasks assessed  Exercises Other Exercises Other Exercises: edu re: role of OT, role of rehab, discharge recommendations, home safety    Shoulder Instructions       General Comments      Pertinent Vitals/ Pain       Pain Assessment Pain Assessment: Faces Faces Pain Scale: Hurts little more Pain Location: L hip Pain Descriptors / Indicators: Grimacing Pain Intervention(s): Limited activity within patient's tolerance, Monitored  during session, Premedicated before session, Repositioned  Home Living                                          Prior Functioning/Environment              Frequency  Min 2X/week        Progress Toward Goals  OT Goals(current goals can now be found in the care plan section)  Progress towards OT goals: Progressing toward goals     Plan Discharge plan needs to be updated;Frequency remains appropriate    Co-evaluation                 AM-PAC OT "6 Clicks" Daily Activity     Outcome Measure   Help from another person eating meals?: None Help from another person taking care of personal grooming?: A Little Help from another person toileting, which includes using toliet, bedpan, or urinal?: A Lot Help from another person bathing (including washing, rinsing, drying)?: A Lot Help from another person to put on and taking off regular upper body clothing?: None Help from another person to put on and taking off regular lower body clothing?: A Lot 6 Click Score: 17    End of Session Equipment Utilized During Treatment: Rolling walker (2 wheels)  OT Visit Diagnosis: Unsteadiness on feet (R26.81);Muscle weakness (generalized) (M62.81)   Activity Tolerance Patient tolerated treatment well;Patient limited by pain   Patient Left with call bell/phone within reach;in chair   Nurse Communication Mobility status        Time: [id number] OT Time Calculation (min): 18 min  Charges: OT General Charges $OT Visit: 1 Visit OT Treatments $Self Care/Home Management : 8-22 mins Shanon Payor, OTD OTR/L  05/13/22, 3:44 PM

## 2022-05-13 NOTE — TOC Progression Note (Signed)
Transition of Care [facility]) - Progression Note    Patient Details  Name: Gina Matthews MRN: [id number] Date of Birth: January 24, 1951  Transition of Care [facility]) CM/SW [place], RN Phone Number: 05/13/2022, 11:33 AM  Clinical Narrative:    Per [place] at [facility]. Patient is already active for PT.         Expected Discharge Plan and Services                                               Social Determinants of Health (SDOH) Interventions SDOH Screenings   Food Insecurity: No Food Insecurity (05/10/2022)  Housing: Low Risk  (05/10/2022)  Transportation Needs: No Transportation Needs (05/10/2022)  Utilities: Not At Risk (05/10/2022)  Tobacco Use: Low Risk  (05/12/2022)    Readmission Risk Interventions     No data to display

## 2022-05-13 NOTE — Plan of Care (Signed)
  Problem: Activity: Goal: Ability to avoid complications of mobility impairment will improve Outcome: Progressing Goal: Ability to tolerate increased activity will improve Outcome: Progressing

## 2022-05-13 NOTE — Assessment & Plan Note (Signed)
Blood pressure within goal.  Initially home antihypertensives were held due to hypotension. -Restart home antihypertensives as needed-currently being held

## 2022-05-13 NOTE — Plan of Care (Signed)
  Problem: Activity: Goal: Ability to avoid complications of mobility impairment will improve Outcome: Progressing   

## 2022-05-13 NOTE — Progress Notes (Signed)
  Subjective: 2 Days Post-Op Procedure(s) (LRB): INCISION AND DRAINAGE EVACUATION HEMATOMA LEFT HIP (Left) APPLICATION OF WOUND VAC  [id number] (Left) Patient reports pain as moderate.   Patient is well, and has had no acute complaints or problems Plan is to go Home after hospital stay. Negative for chest pain and shortness of breath Fever: no Gastrointestinal: Negative for nausea and vomiting  Objective: Vital signs in last 24 hours: Temp:  [97.8 F (36.6 C)-98.7 F (37.1 C)] 97.9 F (36.6 C) (02/20 2352) Pulse Rate:  [83-94] 83 (02/20 2352) Resp:  [15-18] 18 (02/20 2352) BP: (100-116)/(60-83) 116/83 (02/20 2352) SpO2:  [97 %-99 %] 98 % (02/20 2352)  Intake/Output from previous day:  Intake/Output Summary (Last 24 hours) at 05/13/2022 0701 Last data filed at 05/13/2022 0423 Gross per 24 hour  Intake 0 ml  Output 220 ml  Net -220 ml    Intake/Output this shift: No intake/output data recorded.  Labs: Recent Labs    05/10/22 0759 05/10/22 1744 05/11/22 0604  HGB 10.2* 9.2* 9.0*   Recent Labs    05/10/22 0759 05/10/22 1744 05/11/22 0604  WBC 10.5  --   --   RBC 3.27*  --   --   HCT 32.4* 29.1* 28.6*  PLT 384  --   --    Recent Labs    05/10/22 0759 05/11/22 0604  NA 133* 136  K 3.8 3.4*  CL 106 111  CO2 16* 18*  BUN 26* 24*  CREATININE 0.98 0.67  GLUCOSE 68* 85  CALCIUM 8.1* 7.6*   Recent Labs    05/10/22 0759  INR 1.3*     EXAM General - Patient is Alert and Oriented Extremity - Neurovascular intact Sensation intact distally Dorsiflexion/Plantar flexion intact Compartment soft Dressing/Incision - clean, dry, with the wound VAC in place.  The Hemovac tubing was removed with no complication.  The wound VAC was reinforced with more tape. Motor Function - intact, moving foot and toes well on exam.   Past Medical History:  Diagnosis Date   Abnormal EKG    Anemia    Arthritis    Depression    Fibromyalgia    GERD (gastroesophageal reflux  disease)    History of hiatal hernia    Hypertension    Scoliosis    had trouble getting spinal in for TKA in 2022   Tachycardia     Assessment/Plan: 2 Days Post-Op Procedure(s) (LRB): INCISION AND DRAINAGE EVACUATION HEMATOMA LEFT HIP (Left) APPLICATION OF WOUND VAC  [id number] (Left) Principal Problem:   Hematoma of left hip Active Problems:   Gastroesophageal reflux disease with hiatal hernia   Hypertension, essential, benign   Major depressive disorder, recurrent, moderate (HCC)   Overweight (BMI 25.0-29.9)   Hypokalemia   Hypotension  Estimated body mass index is 27.45 kg/m as calculated from the following:   Height as of this encounter: 5' 3"$  (1.6 m).   Weight as of this encounter: 70.3 kg. Advance diet Up with therapy  DVT Prophylaxis - Aspirin, Foot Pumps, and TED hose Weight-Bearing as tolerated to left leg  Reche Dixon, PA-C Orthopaedic Surgery 05/13/2022, 7:01 AM

## 2022-05-14 DIAGNOSIS — S7002XD Contusion of left hip, subsequent encounter: Secondary | ICD-10-CM | POA: Diagnosis not present

## 2022-05-14 MED ORDER — CARVEDILOL 3.125 MG PO TABS
6.2500 mg | ORAL_TABLET | Freq: Two times a day (BID) | ORAL | Status: DC
  Administered 2022-05-14 (×2): 6.25 mg via ORAL
  Filled 2022-05-14 (×2): qty 2

## 2022-05-14 MED ORDER — AMLODIPINE BESYLATE 5 MG PO TABS
5.0000 mg | ORAL_TABLET | Freq: Every evening | ORAL | Status: DC
  Administered 2022-05-14 – 2022-05-15 (×2): 5 mg via ORAL
  Filled 2022-05-14 (×2): qty 1

## 2022-05-14 NOTE — Care Management Important Message (Signed)
Important Message  Patient Details  Name: Channy Knibbs MRN: [id number] Date of Birth: 07-28-1950   Medicare Important Message Given:  Yes     Juliann Pulse A Dellis Voght 05/14/2022, 11:23 AM

## 2022-05-14 NOTE — NC FL2 (Signed)
[place] LEVEL OF CARE FORM     IDENTIFICATION  Patient Name: Gina Matthews Birthdate: 01-30-1951 Sex: female Admission Date (Current Location): 05/10/2022  [facility] and [place] Number:  Engineering geologist and Address:  [facility], [address] [place], [place] [address]      Provider Number: [id number]  Attending Physician Name and Address:  Lorella Nimrod, MD  Relative Name and Phone Number:  Zena Amos (Daughter) [phone]    Current Level of Care: Hospital Recommended Level of Care: [place] Prior Approval Number:    Date Approved/Denied:   PASRR Number: [id number]  Discharge Plan: SNF    Current Diagnoses: Patient Active Problem List   Diagnosis Date Noted   Overweight (BMI 25.0-29.9) 05/11/2022   Hypokalemia 05/11/2022   Hypotension 05/11/2022   Hematoma of left hip 05/10/2022   Hx of total hip arthroplasty, left 04/15/2022   Primary osteoarthritis of left hip 02/01/2022   History of shingles 02/21/2021   Status post total right knee replacement 01/27/2021   Gastroesophageal reflux disease with hiatal hernia 10/03/2020   Tachycardia 06/28/2020   Major depressive disorder, recurrent, moderate ([place]) 11/23/2019   Age-related osteoporosis without current pathological fracture 07/17/2019   Abnormal electrocardiogram (ECG) (EKG) 10/01/2018   Dental injury 10/01/2018   Maxillary fracture ([place]) 10/01/2018   Syncope 10/01/2018   Kyphosis of thoracic region 07/30/2017   Chronic midline low back pain without sciatica 07/30/2017   Hyponatremia 11/20/2016   Chronic pain syndrome 12/23/2014   Fibromyalgia muscle pain 12/23/2014   Allergic rhinitis 12/15/2014   Arthritis involving multiple sites 12/15/2014   Chronic insomnia 12/15/2014   Arthritis of both knees 07/31/2014   Seasonal allergies 07/31/2014   Hypertension, essential, benign 06/08/2014   Alcohol dependence in early, early partial,  sustained full, or sustained partial remission ([place]) 01/03/2014   History of alcohol dependence ([place]) 01/03/2014    Orientation RESPIRATION BLADDER Height & Weight     Self, Time, Situation, Place  O2 (O22L per nasal cannula) Continent Weight: 70.3 kg Height:  '5\' 3"'$  (160 cm)  BEHAVIORAL SYMPTOMS/MOOD NEUROLOGICAL BOWEL NUTRITION STATUS  Other (Comment) (n/a)  (n/a) Continent Diet (Regular)  AMBULATORY STATUS COMMUNICATION OF NEEDS Skin   Limited Assist Verbally Surgical wounds (L hip)                       Personal Care Assistance Level of Assistance  Bathing, Dressing Bathing Assistance: Limited assistance   Dressing Assistance: Limited assistance     Functional Limitations Info             SPECIAL CARE FACTORS FREQUENCY  OT (By licensed OT), PT (By licensed PT)     PT Frequency: Min 2x weekly OT Frequency: Min 2x weekly            Contractures Contractures Info: Not present    Additional Factors Info  Code Status, Allergies Code Status Info: FULL Allergies Info: No Known Allergies           Current Medications (05/14/2022):  This is the current hospital active medication list Current Facility-Administered Medications  Medication Dose Route Frequency Provider Last Rate Last Admin   0.9 %  sodium chloride infusion   Intravenous Continuous Hooten, Laurice Record, MD   Stopped at 05/12/22 1821   acetaminophen (TYLENOL) tablet 325-650 mg  325-650 mg Oral Q6H PRN Dereck Leep, MD   650 mg at 05/14/22 1818   alum & mag hydroxide-simeth (MAALOX/MYLANTA)  200-200-20 MG/5ML suspension 30 mL  30 mL Oral Q4H PRN Hooten, Laurice Record, MD       amLODipine (NORVASC) tablet 5 mg  5 mg Oral QPM Lorella Nimrod, MD   5 mg at 05/14/22 1806   aspirin chewable tablet 81 mg  81 mg Oral BID Dereck Leep, MD   81 mg at 05/14/22 [id number]   bisacodyl (DULCOLAX) suppository 10 mg  10 mg Rectal Daily PRN Hooten, Laurice Record, MD       carvedilol (COREG) tablet 6.25 mg  6.25 mg Oral BID Lorella Nimrod, MD   6.25 mg at 05/14/22 [id number]   celecoxib (CELEBREX) capsule 200 mg  200 mg Oral BID Dereck Leep, MD   200 mg at 05/14/22 [id number]   cholecalciferol (VITAMIN D3) 25 MCG (1000 UNIT) tablet 1,000 Units  1,000 Units Oral Daily Dereck Leep, MD   1,000 Units at 05/14/22 [id number]   diphenhydrAMINE (BENADRYL) 12.5 MG/5ML elixir 12.5-25 mg  12.5-25 mg Oral Q4H PRN Hooten, Laurice Record, MD       DULoxetine (CYMBALTA) DR capsule 60 mg  60 mg Oral BH-q7a Hooten, Laurice Record, MD   60 mg at 05/14/22 0600   ferrous sulfate tablet 325 mg  325 mg Oral Q breakfast Dereck Leep, MD   325 mg at 05/14/22 [id number]   gabapentin (NEURONTIN) capsule 1,200 mg  1,200 mg Oral BID Dereck Leep, MD   1,200 mg at 05/14/22 [id number]   gabapentin (NEURONTIN) capsule 600 mg  600 mg Oral Q1200 Dereck Leep, MD   600 mg at 05/14/22 1231   HYDROmorphone (DILAUDID) injection 0.5-1 mg  0.5-1 mg Intravenous Q4H PRN Hooten, Laurice Record, MD       magnesium hydroxide (MILK OF MAGNESIA) suspension 30 mL  30 mL Oral Daily Hooten, Laurice Record, MD   30 mL at 05/13/22 0813   menthol-cetylpyridinium (CEPACOL) lozenge 3 mg  1 lozenge Oral PRN Hooten, Laurice Record, MD       Or   phenol (CHLORASEPTIC) mouth spray 1 spray  1 spray Mouth/Throat PRN Hooten, Laurice Record, MD       multivitamin with minerals tablet 1 tablet  1 tablet Oral Daily Hooten, Laurice Record, MD   1 tablet at 05/14/22 0853   ondansetron (ZOFRAN) tablet 4 mg  4 mg Oral Q6H PRN Hooten, Laurice Record, MD       Or   ondansetron (ZOFRAN) injection 4 mg  4 mg Intravenous Q6H PRN Hooten, Laurice Record, MD       oxyCODONE (Oxy IR/ROXICODONE) immediate release tablet 10 mg  10 mg Oral Q4H PRN Hooten, Laurice Record, MD   10 mg at 05/14/22 1536   oxyCODONE (Oxy IR/ROXICODONE) immediate release tablet 5 mg  5 mg Oral Q4H PRN Dereck Leep, MD   5 mg at 05/12/22 2102   pantoprazole (PROTONIX) EC tablet 40 mg  40 mg Oral BID Dereck Leep, MD   40 mg at 05/14/22 [id number]   senna-docusate (Senokot-S) tablet 1 tablet  1 tablet Oral  BID Dereck Leep, MD   1 tablet at 05/13/22 [id number]   sodium phosphate (FLEET) 7-19 GM/118ML enema 1 enema  1 enema Rectal Once PRN Hooten, Laurice Record, MD       traMADol Veatrice [place]) tablet 50-100 mg  50-100 mg Oral Q4H PRN Dereck Leep, MD   100 mg at 05/13/22 1732   traZODone (DESYREL) tablet 50 mg  50 mg Oral QHS Skip Estimable  P, MD   50 mg at 05/13/22 2227     Discharge Medications: Please see discharge summary for a list of discharge medications.  Relevant Imaging Results:  Relevant Lab Results:   Additional Information SS# [id number]  Laurena Slimmer, RN

## 2022-05-14 NOTE — Plan of Care (Signed)
  Problem: Activity: Goal: Ability to avoid complications of mobility impairment will improve Outcome: Progressing   

## 2022-05-14 NOTE — Progress Notes (Addendum)
Physical Therapy Treatment Patient Details Name: Gina Matthews MRN: [id number] DOB: 11/04/1950 Today's Date: 05/14/2022   History of Present Illness Pt admitted for L hip hematoma and is POD 1 s/p I&D for evacuation of hematoma. PMH includes fibromyalgia, HTN, GERD, depression and recent L THR on 04/15/22. Currently active with HHPT.    PT Comments    Slow progress towards goals with pt still very limited by pain. Premedicated prior to session and improved distance noted this session. Unsteady and needs supervision for all mobility. Pt reports she wasn't given any resources for private care and family is unable to assist. Asking to go to SNF at this time unless home support can be arranged. Pt will not be safe at home alone, therefore updating recommendations. Pt demonstrates decreased endurance/mobility/balance and still struggles with pain management. Will continue to progress.   Recommendations for follow up therapy are one component of a multi-disciplinary discharge planning process, led by the attending physician.  Recommendations may be updated based on patient status, additional functional criteria and insurance authorization.  Follow Up Recommendations  Skilled nursing-short term rehab (<3 hours/day) Can patient physically be transported by private vehicle: Yes   Assistance Recommended at Discharge Intermittent Supervision/Assistance  Patient can return home with the following A little help with walking and/or transfers;A little help with bathing/dressing/bathroom;Help with stairs or ramp for entrance   Equipment Recommendations  None recommended by PT    Recommendations for Other Services       Precautions / Restrictions Precautions Precautions: Fall;Posterior Hip Precaution Booklet Issued: No Restrictions Weight Bearing Restrictions: Yes LLE Weight Bearing: Weight bearing as tolerated     Mobility  Bed Mobility Overal bed mobility: Needs Assistance Bed Mobility: Supine  to Sit     Supine to sit: Min guard     General bed mobility comments: cues for sequencing and slight assist for surgical LE    Transfers Overall transfer level: Needs assistance Equipment used: Rolling walker (2 wheels) Transfers: Sit to/from Stand Sit to Stand: Min guard           General transfer comment: improved technique with no cues given. RW used with extremely flexed posture once standing    Ambulation/Gait Ambulation/Gait assistance: Min guard Gait Distance (Feet): 80 Feet Assistive device: Rolling walker (2 wheels) Gait Pattern/deviations: Step-through pattern       General Gait Details: ambulated in hallway with reciprocal gait pattern. Several standing rest breaks required secondary to SOB symptoms and pain. Heavy education to not rush mobility as she becomes unsteady and states multiple times "I'm going to fall!!" O2 sats at 99% on RA with mobility and HR at 132bpm with exertion   Stairs             Wheelchair Mobility    Modified Rankin (Stroke Patients Only)       Balance Overall balance assessment: Needs assistance Sitting-balance support: Feet supported Sitting balance-Leahy Scale: Good     Standing balance support: Bilateral upper extremity supported, Reliant on assistive device for balance, During functional activity Standing balance-Leahy Scale: Good                              Cognition Arousal/Alertness: Awake/alert Behavior During Therapy: WFL for tasks assessed/performed Overall Cognitive Status: Within Functional Limits for tasks assessed  General Comments: pleasant and agreeable to session        Exercises Other Exercises Other Exercises: seated ther-ex performed on surigcal leg including SAQ, hip abd/add, QS, AP, and LAQ. 10 reps with cga and encouragement to continue to perform.    General Comments        Pertinent Vitals/Pain Pain Assessment Pain  Assessment: 0-10 Pain Score: 8  Pain Location: L hip Pain Descriptors / Indicators: Grimacing Pain Intervention(s): Limited activity within patient's tolerance, Premedicated before session, Repositioned    Home Living                          Prior Function            PT Goals (current goals can now be found in the care plan section) Acute Rehab PT Goals Patient Stated Goal: to go to SNF PT Goal Formulation: With patient Time For Goal Achievement: 05/26/22 Potential to Achieve Goals: Good Progress towards PT goals: Progressing toward goals    Frequency    7X/week      PT Plan Discharge plan needs to be updated    Co-evaluation              AM-PAC PT "6 Clicks" Mobility   Outcome Measure  Help needed turning from your back to your side while in a flat bed without using bedrails?: A Little Help needed moving from lying on your back to sitting on the side of a flat bed without using bedrails?: A Little Help needed moving to and from a bed to a chair (including a wheelchair)?: A Little Help needed standing up from a chair using your arms (e.g., wheelchair or bedside chair)?: A Little Help needed to walk in hospital room?: A Little Help needed climbing 3-5 steps with a railing? : A Lot 6 Click Score: 17    End of Session Equipment Utilized During Treatment: Gait belt Activity Tolerance: Patient limited by pain Patient left: in chair;with chair alarm set Nurse Communication: Mobility status PT Visit Diagnosis: Muscle weakness (generalized) (M62.81);Difficulty in walking, not elsewhere classified (R26.2);Pain Pain - Right/Left: Left Pain - part of body: Hip     Time: 0920-0936 PT Time Calculation (min) (ACUTE ONLY): 16 min  Charges:  $Gait Training: 8-22 mins                     Greggory Stallion, PT, DPT, GCS [phone]    Gina Matthews 05/14/2022, 10:36 AM

## 2022-05-14 NOTE — Progress Notes (Signed)
Progress Note   Patient: Gina Matthews [id number] DOB: 21-Dec-1950 DOA: 05/10/2022     4 DOS: the patient was seen and examined on 05/14/2022   Brief hospital course: 72 y.o. female with medical history significant for fibromyalgia, hypertension, GERD, depression, status post left total hip arthroplasty which was done on 04/15/22 who presents to the emergency room for evaluation of bleeding from her incision site. Patient was on Lovenox for DVT prophylaxis for 2 weeks and has since completed the course.  She was seen in the emergency room on 05/03/22 for evaluation of bloody drainage from her left hip and was discharged home to follow-up with her orthopedic surgeon.  She states that she followed up with the surgeon as an outpatient and her incision was reinforced with Steri-Strips.  She notes that she did well until about 3 days prior to this admission when she started oozing again from the wound.  On the morning of her admission she notes that her clothes and bedding's were saturated with blood which prompted her visit to the ER.  She complains of pain and swelling in her left thigh as well as difficulty with ambulation due to severe pain. She denies having any fever, no chills, no cough, no chest pain, no shortness of breath, no headache, no dizziness, no lightheadedness, no abdominal pain, no changes in her bowel habits, no urinary symptoms, no blurred vision, no focal deficit. She has been taking ibuprofen for pain control and denies being on any other anticoagulants. Upon arrival to the ER she had a blood pressure of 86/54 that responded to IV fluid resuscitation Hemoglobin is 10.2 and appears stable CT scan of the left lower extremity shows mild interval increase in the size of the hemorrhage within the subcutaneous soft tissues lateral to the left proximal femur arthroplasty. Hematoma currently measures 18.5 x 7.3 x 9.2 cm, previously 13.7 x 6.5 x 9.3 cm. Smaller area of hemorrhage/fluid along  the posterior margin of the proximal femur is without significant change, currently measuring 7.9 x 3.2 x 5.5 cm. No new collections.  No evidence of prosthesis loosening. She has been seen in consultation by orthopedic surgery with plans for irrigation and debridement of the wound in a.m. She will be admitted to the hospital for further evaluation  2/19.  Okay to have gabapentin and Cymbalta this morning.  Patient going to the operating room today for hematoma evacuation and inspection to see if any bleeding.  2/21: Vitals normal.  S/p incision and drainage and wound VAC placement for left hip hematoma on 05/11/2022.  Patient with still significant pain limiting mobility, she wants to go home to maximum home health services ordered.  Would like to spend another night to keep working with PT. 2/22: Hemodynamically stable but continued to have very limited mobility due to pain and wound VAC in place.  PT changed the recommendations to SNF and patient now agrees.  Assessment and Plan: * Hematoma of left hip With recent hip surgery.  Patient went to the OR 2/19 for evacuation of hematoma.  Today's hemoglobin 9.1.  Patient will go home with a wound VAC.   -PT is recommending home health.  Hypotension Blood pressure now within goal. Keep holding lisinopril Coreg and amlodipine.-We will restart as needed  Major depressive disorder, recurrent, moderate (HCC) Continue duloxetine and trazodone  Hypokalemia Oral potassium supplementation today  Gastroesophageal reflux disease with hiatal hernia Continue PPI  Overweight (BMI 25.0-29.9) BMI 27.46  Hypertension, essential, benign Blood pressure within goal.  Initially home antihypertensives were held due to hypotension. -Restart home antihypertensives as needed-currently being held   Subjective: Patient was sitting in chair when seen today.  She agrees to go to SNF stating that she will not be able to take care of herself as she lives alone and  agrees now with PT evaluation.  Physical Exam: Vitals:   05/13/22 1502 05/13/22 2320 05/14/22 0452 05/14/22 0814  BP: 119/88 131/76 (!) 147/71 (!) 142/71  Pulse: (!) 106 (!) 109 99 100  Resp: 14 18 20 18  $ Temp: 98.6 F (37 C) 99.8 F (37.7 C) 98.9 F (37.2 C) 98.9 F (37.2 C)  TempSrc: Oral     SpO2: 97% 99% 100% 100%  Weight:      Height:       General.  Well-developed lady, in no acute distress. Pulmonary.  Lungs clear bilaterally, normal respiratory effort. CV.  Regular rate and rhythm, no JVD, rub or murmur. Abdomen.  Soft, nontender, nondistended, BS positive. CNS.  Alert and oriented .  No focal neurologic deficit. Extremities.  No edema, no cyanosis, pulses intact and symmetrical.  Wound VAC in place Psychiatry.  Judgment and insight appears normal.    Data Reviewed: Prior data reviewed  Family Communication: Discussed with daughter at bedside  Disposition: Status is: Inpatient Remains inpatient appropriate because: Likely home tomorrow after orthopedic evaluation.  Will likely have drain removed tomorrow and will go home with wound VAC.  Planned Discharge Destination: Home with Home Health  Time spent: 38 minutes  This record has been created using Systems analyst. Errors have been sought and corrected,but may not always be located. Such creation errors do not reflect on the standard of care.   Author: Lorella Nimrod, MD 05/14/2022 2:49 PM  For on call review [email or web address].

## 2022-05-14 NOTE — Discharge Instructions (Signed)
INSTRUCTIONS AFTER Surgery  Remove items at home which could result in a fall. This includes throw rugs or furniture in walking pathways ICE to the affected joint every three hours while awake for 30 minutes at a time, for at least the first 3-5 days, and then as needed for pain and swelling.  Continue to use ice for pain and swelling. You may notice swelling that will progress down to the foot and ankle.  This is normal after surgery.  Elevate your leg when you are not up walking on it.   Continue to use the breathing machine you got in the hospital (incentive spirometer) which will help keep your temperature down.  It is common for your temperature to cycle up and down following surgery, especially at night when you are not up moving around and exerting yourself.  The breathing machine keeps your lungs expanded and your temperature down.   DIET:  As you were doing prior to hospitalization, we recommend a well-balanced diet.  DRESSING / WOUND CARE / SHOWERING  Wound VAC to remain in place at all times.  When the battery dies and the wound VAC completes its 2 weeks of use, the wound VAC can be removed by physical therapy.  Able to shower.  Follow-up with [facility] clinic orthopedics in 2 weeks for staple removal.  ACTIVITY  Increase activity slowly as tolerated, but follow the weight bearing instructions below.   No driving for 6 weeks or until further direction given by your physician.  You cannot drive while taking narcotics.  No lifting or carrying greater than 10 lbs. until further directed by your surgeon. Avoid periods of inactivity such as sitting longer than an hour when not asleep. This helps prevent blood clots.  You may return to work once you are authorized by your doctor.     WEIGHT BEARING    Weightbearing as tolerated on the left   EXERCISES Gait training and ambulation training.  Strengthening.  CONSTIPATION  Constipation is defined medically as fewer than three  stools per week and severe constipation as less than one stool per week.  Even if you have a regular bowel pattern at home, your normal regimen is likely to be disrupted due to multiple reasons following surgery.  Combination of anesthesia, postoperative narcotics, change in appetite and fluid intake all can affect your bowels.   YOU MUST use at least one of the following options; they are listed in order of increasing strength to get the job done.  They are all available over the counter, and you may need to use some, POSSIBLY even all of these options:    Drink plenty of fluids (prune juice may be helpful) and high fiber foods Colace 100 mg by mouth twice a day  Senokot for constipation as directed and as needed Dulcolax (bisacodyl), take with full glass of water  Miralax (polyethylene glycol) once or twice a day as needed.  If you have tried all these things and are unable to have a bowel movement in the first 3-4 days after surgery call either your surgeon or your primary doctor.    If you experience loose stools or diarrhea, hold the medications until you stool forms back up.  If your symptoms do not get better within 1 week or if they get worse, check with your doctor.  If you experience "the worst abdominal pain ever" or develop nausea or vomiting, please contact the office immediately for further recommendations for treatment.   ITCHING:  If you experience itching with your medications, try taking only a single pain pill, or even half a pain pill at a time.  You can also use Benadryl over the counter for itching or also to help with sleep.   TED HOSE STOCKINGS:  Use stockings on both legs until for at least 2 weeks or as directed by physician office. They may be removed at night for sleeping.  MEDICATIONS:  See your medication summary on the "After Visit Summary" that nursing will review with you.  You may have some home medications which will be placed on hold until you complete the course  of blood thinner medication.  It is important for you to complete the blood thinner medication as prescribed.  PRECAUTIONS:  If you experience chest pain or shortness of breath - call 911 immediately for transfer to the hospital emergency department.   If you develop a fever greater that 101 F, purulent drainage from wound, increased redness or drainage from wound, foul odor from the wound/dressing, or calf pain - CONTACT YOUR SURGEON.                                                   FOLLOW-UP APPOINTMENTS:  If you do not already have a post-op appointment, please call the office for an appointment to be seen by your surgeon.  Guidelines for how soon to be seen are listed in your "After Visit Summary", but are typically between 1-4 weeks after surgery.  OTHER INSTRUCTIONS:     MAKE SURE YOU:  Understand these instructions.  Get help right away if you are not doing well or get worse.    Thank you for letting us be a part of your medical care team.  It is a privilege we respect greatly.  We hope these instructions will help you stay on track for a fast and full recovery!

## 2022-05-14 NOTE — Progress Notes (Signed)
Occupational Therapy Treatment Patient Details Name: Gina Matthews MRN: [id number] DOB: 07-Feb-1951 Today's Date: 05/14/2022   History of present illness Pt admitted for L hip hematoma and is POD 1 s/p I&D for evacuation of hematoma. PMH includes fibromyalgia, HTN, GERD, depression and recent L THR on 04/15/22. Currently active with HHPT.   OT comments  Chart reviewed, pt greeted in bed agreeable to OT tx session. Tx session targeted improving functional activity tolerance in the setting of ADL/functional mobility tasks. Pt reports she does not feel she can get resources at home for assist with ADLs, does not currently have family assist for ADL after discharge. Discussed discharge recommendations and safe technique with hip precautions completing ADLs. Pt is now requesting work up to discharge to STR, discharge recommendations have been updated as pt is a high fall risk and requires at least CGA-supervision for ADL tasks at this time. MAX A required for LB dressing. Potential to progress to discharge home with [facility] pending progress on floor. OT will continue to follow acutely.    Recommendations for follow up therapy are one component of a multi-disciplinary discharge planning process, led by the attending physician.  Recommendations may be updated based on patient status, additional functional criteria and insurance authorization.    Follow Up Recommendations  Skilled nursing-short term rehab (<3 hours/day)     Assistance Recommended at Discharge Intermittent Supervision/Assistance  Patient can return home with the following  A little help with bathing/dressing/bathroom;Assistance with cooking/housework;Assist for transportation;Help with stairs or ramp for entrance   Equipment Recommendations  None recommended by OT    Recommendations for Other Services      Precautions / Restrictions Precautions Precautions: Fall;Posterior Hip Precaution Booklet Issued: No Restrictions Weight Bearing  Restrictions: Yes LLE Weight Bearing: Weight bearing as tolerated       Mobility Bed Mobility Overal bed mobility: Needs Assistance Bed Mobility: Supine to Sit, Sit to Supine     Supine to sit: Min guard Sit to supine: Mod assist   General bed mobility comments: for managment of LLE    Transfers Overall transfer level: Needs assistance Equipment used: Rolling walker (2 wheels) Transfers: Sit to/from Stand Sit to Stand: Min guard                 Balance Overall balance assessment: Needs assistance Sitting-balance support: Feet supported Sitting balance-Leahy Scale: Good     Standing balance support: Bilateral upper extremity supported, Reliant on assistive device for balance, During functional activity Standing balance-Leahy Scale: Fair                             ADL either performed or assessed with clinical judgement   ADL Overall ADL's : Needs assistance/impaired                     Lower Body Dressing: Maximal assistance   Toilet Transfer: Min guard;Rolling walker (2 wheels);BSC/3in1 Toilet Transfer Details (indicate cue type and reason): one vc for technique Toileting- Clothing Manipulation and Hygiene: Sitting/lateral lean;Supervision/safety       Functional mobility during ADLs: Supervision/safety;Min guard;Rolling walker (2 wheels) (approx 54' with RW)      Extremity/Trunk Assessment              Vision       Perception     Praxis      Cognition Arousal/Alertness: Awake/alert Behavior During Therapy: WFL for tasks assessed/performed Overall Cognitive Status: Within Functional Limits  for tasks assessed                                          Exercises      Shoulder Instructions       General Comments spo2 >95 % throughout; wound vac intact pre/post session    Pertinent Vitals/ Pain       Pain Assessment Pain Assessment: 0-10 Pain Score: 7  Pain Location: L hip Pain Descriptors /  Indicators: Grimacing Pain Intervention(s): Limited activity within patient's tolerance, Monitored during session, Premedicated before session  Home Living                                          Prior Functioning/Environment              Frequency  Min 2X/week        Progress Toward Goals  OT Goals(current goals can now be found in the care plan section)  Progress towards OT goals: Progressing toward goals     Plan Discharge plan needs to be updated;Frequency remains appropriate    Co-evaluation                 AM-PAC OT "6 Clicks" Daily Activity     Outcome Measure   Help from another person eating meals?: None Help from another person taking care of personal grooming?: A Little Help from another person toileting, which includes using toliet, bedpan, or urinal?: A Lot Help from another person bathing (including washing, rinsing, drying)?: A Lot Help from another person to put on and taking off regular upper body clothing?: None Help from another person to put on and taking off regular lower body clothing?: A Lot 6 Click Score: 17    End of Session Equipment Utilized During Treatment: Rolling walker (2 wheels)  OT Visit Diagnosis: Unsteadiness on feet (R26.81);Muscle weakness (generalized) (M62.81)   Activity Tolerance Patient tolerated treatment well   Patient Left in bed;with call bell/phone within reach;with bed alarm set;with nursing/sitter in room   Nurse Communication Mobility status        Time: [id number] OT Time Calculation (min): 17 min  Charges: OT General Charges $OT Visit: 1 Visit OT Treatments $Self Care/Home Management : 8-22 mins  Shanon Payor, OTD OTR/L  05/14/22, 12:44 PM

## 2022-05-14 NOTE — Progress Notes (Signed)
  Subjective: 3 Days Post-Op Procedure(s) (LRB): INCISION AND DRAINAGE EVACUATION HEMATOMA LEFT HIP (Left) APPLICATION OF WOUND VAC  [id number] (Left) Patient reports pain as moderate.   Patient is well, and has had no acute complaints or problems Plan is to go Home versus rehab after hospital stay.  Depending on physical therapy ambulation and assistance at home Negative for chest pain and shortness of breath Fever: no Gastrointestinal: Negative for nausea and vomiting  Objective: Vital signs in last 24 hours: Temp:  [98.1 F (36.7 C)-99.8 F (37.7 C)] 98.9 F (37.2 C) (02/22 0452) Pulse Rate:  [88-109] 99 (02/22 0452) Resp:  [14-20] 20 (02/22 0452) BP: (119-147)/(71-88) 147/71 (02/22 0452) SpO2:  [97 %-100 %] 100 % (02/22 0452)  Intake/Output from previous day:  Intake/Output Summary (Last 24 hours) at 05/14/2022 0629 Last data filed at 05/13/2022 1956 Gross per 24 hour  Intake 100 ml  Output --  Net 100 ml    Intake/Output this shift: Total I/O In: 100 [P.O.:100] Out: -   Labs: Recent Labs    05/13/22 0925  HGB 9.1*   Recent Labs    05/13/22 0925  WBC 5.9  RBC 2.97*  HCT 28.6*  PLT 319   Recent Labs    05/13/22 0925  NA 134*  K 4.3  CL 109  CO2 20*  BUN 22  CREATININE 0.66  GLUCOSE 79  CALCIUM 8.1*   No results for input(s): "LABPT", "INR" in the last 72 hours.    EXAM General - Patient is Alert and Oriented Extremity - Neurovascular intact Sensation intact distally Dorsiflexion/Plantar flexion intact Compartment soft Dressing/Incision - clean, dry, with the wound VAC in place.  Motor Function - intact, moving foot and toes well on exam.  Ambulated 20 feet with physical therapy yesterday.  Past Medical History:  Diagnosis Date   Abnormal EKG    Anemia    Arthritis    Depression    Fibromyalgia    GERD (gastroesophageal reflux disease)    History of hiatal hernia    Hypertension    Scoliosis    had trouble getting spinal in for TKA  in 2022   Tachycardia     Assessment/Plan: 3 Days Post-Op Procedure(s) (LRB): INCISION AND DRAINAGE EVACUATION HEMATOMA LEFT HIP (Left) APPLICATION OF WOUND VAC  [id number] (Left) Principal Problem:   Hematoma of left hip Active Problems:   Gastroesophageal reflux disease with hiatal hernia   Hypertension, essential, benign   Major depressive disorder, recurrent, moderate (HCC)   Overweight (BMI 25.0-29.9)   Hypokalemia   Hypotension  Estimated body mass index is 27.45 kg/m as calculated from the following:   Height as of this encounter: 5' 3"$  (1.6 m).   Weight as of this encounter: 70.3 kg. Advance diet Up with therapy  Discharge planning: Follow-up at [facility] clinic orthopedics in 2 weeks for wound care and staple removal.  DVT Prophylaxis - Aspirin, Foot Pumps, and TED hose Weight-Bearing as tolerated to left leg  Reche Dixon, PA-C Orthopaedic Surgery 05/14/2022, 6:29 AM

## 2022-05-14 NOTE — TOC Progression Note (Signed)
Transition of Care [facility]) - Progression Note    Patient Details  Name: Gina Matthews MRN: [id number] Date of Birth: 1950-12-25  Transition of Care [facility]) CM/SW Contact  Laurena Slimmer, RN Phone Number: 05/14/2022, 1:53 PM  Clinical Narrative:    Damaris Schooner with patient regarding recommendation for SNF. She is agreeable to local SNF's. She does not have a preference.   [place] PASSAR obtained.  Bed search initiated.         Expected Discharge Plan and Services                                               Social Determinants of Health (SDOH) Interventions SDOH Screenings   Food Insecurity: No Food Insecurity (05/10/2022)  Housing: Low Risk  (05/10/2022)  Transportation Needs: No Transportation Needs (05/10/2022)  Utilities: Not At Risk (05/10/2022)  Tobacco Use: Low Risk  (05/12/2022)    Readmission Risk Interventions     No data to display

## 2022-05-15 DIAGNOSIS — S7002XD Contusion of left hip, subsequent encounter: Secondary | ICD-10-CM | POA: Diagnosis not present

## 2022-05-15 DIAGNOSIS — E861 Hypovolemia: Secondary | ICD-10-CM

## 2022-05-15 DIAGNOSIS — I9589 Other hypotension: Secondary | ICD-10-CM | POA: Diagnosis not present

## 2022-05-15 DIAGNOSIS — K219 Gastro-esophageal reflux disease without esophagitis: Secondary | ICD-10-CM | POA: Diagnosis not present

## 2022-05-15 DIAGNOSIS — I10 Essential (primary) hypertension: Secondary | ICD-10-CM

## 2022-05-15 DIAGNOSIS — F331 Major depressive disorder, recurrent, moderate: Secondary | ICD-10-CM | POA: Diagnosis not present

## 2022-05-15 MED ORDER — ASPIRIN 81 MG PO CHEW: 81.0000 mg | CHEWABLE_TABLET | Freq: Two times a day (BID) | ORAL | 0 refills | Status: AC

## 2022-05-15 MED ORDER — CARVEDILOL 12.5 MG PO TABS
12.5000 mg | ORAL_TABLET | Freq: Two times a day (BID) | ORAL | Status: DC
  Administered 2022-05-15: 12.5 mg via ORAL
  Filled 2022-05-15: qty 1

## 2022-05-15 NOTE — TOC Progression Note (Signed)
Transition of Care [facility]) - Progression Note    Patient Details  Name: Gina Matthews MRN: [id number] Date of Birth: 14-Nov-1950  Transition of Care [facility]) CM/SW Contact  Laurena Slimmer, RN Phone Number: 05/15/2022, 12:53 PM  Clinical Narrative:    Spoke with patient at bedside. Provided list of SNF.s. Patient not agreeable to [facility]. She was provided offers for [facility], [place].   She is still deciding which facility she would like. She was advised discharge is pending placement.        Expected Discharge Plan and Services                                               Social Determinants of Health (SDOH) Interventions SDOH Screenings   Food Insecurity: No Food Insecurity (05/10/2022)  Housing: Low Risk  (05/10/2022)  Transportation Needs: No Transportation Needs (05/10/2022)  Utilities: Not At Risk (05/10/2022)  Tobacco Use: Low Risk  (05/12/2022)    Readmission Risk Interventions     No data to display

## 2022-05-15 NOTE — TOC Transition Note (Signed)
Transition of Care [facility]) - CM/SW Discharge Note   Patient Details  Name: Gina Matthews MRN: [id number] Date of Birth: 1950/11/25  Transition of Care [facility]) CM/SW Contact:  Laurena Slimmer, RN Phone Number: 05/15/2022, 2:45 PM   Clinical Narrative:    Patient is agreeable to bed offer at [facility]. Per Benita Stabile at [facility] patient can be accepted today. Patient notified. Assigned room # 610A Call report to [phone] EMS arranged  Face sheet and medical necessity forms printed to floor please add to EMS packet. [place] summary and SNF transfer report send in [place]     Final next level of care: [place] Barriers to Discharge: Barriers Resolved   Patient Goals and CMS Choice      Discharge Placement                Patient chooses bed at: [facility] [place] Patient to be transferred to facility by: [facility]   Patient and family notified of of transfer: 05/15/22  Discharge Plan and Services Additional resources added to the After Visit Summary for                                       Social Determinants of Health (SDOH) Interventions SDOH Screenings   Food Insecurity: No Food Insecurity (05/10/2022)  Housing: Low Risk  (05/10/2022)  Transportation Needs: No Transportation Needs (05/10/2022)  Utilities: Not At Risk (05/10/2022)  Tobacco Use: Low Risk  (05/12/2022)     Readmission Risk Interventions     No data to display

## 2022-05-15 NOTE — Progress Notes (Signed)
Mobility Specialist - Progress Note     05/15/22 1427  Mobility  Activity Ambulated with assistance to bathroom;Stood at bedside;Dangled on edge of bed  Level of Assistance Contact guard assist, steadying assist  Assistive Device Front wheel walker  Distance Ambulated (ft) 10 ft  LLE Weight Bearing WBAT  Activity Response Tolerated well  Mobility Referral Yes  $Mobility charge 1 Mobility   Pt resting in recliner on RA upon entry. Pt STS and ambulates to bathroom CGA with RW. Pt returned to bed and left with needs in reach and bed alarm activated.   Loma Sender Mobility Specialist 05/15/22, 2:30 PM

## 2022-05-15 NOTE — Discharge Summary (Signed)
Physician Discharge Summary   Patient: Gina Matthews MRN: [id number] DOB: [birth year]  Admit date:     05/10/2022  Discharge date: 05/15/22  Discharge Physician: Lorella Nimrod   PCP: Barbaraann Boys, MD   Recommendations at discharge:  Please obtain CBC and BMP within a week Follow-up with orthopedic surgery Follow-up with primary care provider  Discharge Diagnoses: Principal Problem:   Hematoma of left hip Active Problems:   Hypotension   Major depressive disorder, recurrent, moderate (HCC)   Hypokalemia   Gastroesophageal reflux disease with hiatal hernia   Overweight (BMI 25.0-29.9)   Hypertension, essential, benign   Hospital Course: 72 y.o. female with medical history significant for fibromyalgia, hypertension, GERD, depression, status post left total hip arthroplasty which was done on 04/15/22 who presents to the emergency room for evaluation of bleeding from her incision site. Patient was on Lovenox for DVT prophylaxis for 2 weeks and has since completed the course.  She was seen in the emergency room on 05/03/22 for evaluation of bloody drainage from her left hip and was discharged home to follow-up with her orthopedic surgeon.  She states that she followed up with the surgeon as an outpatient and her incision was reinforced with Steri-Strips.  She notes that she did well until about 3 days prior to this admission when she started oozing again from the wound.  On the morning of her admission she notes that her clothes and bedding's were saturated with blood which prompted her visit to the ER.  She complains of pain and swelling in her left thigh as well as difficulty with ambulation due to severe pain. She denies having any fever, no chills, no cough, no chest pain, no shortness of breath, no headache, no dizziness, no lightheadedness, no abdominal pain, no changes in her bowel habits, no urinary symptoms, no blurred vision, no focal deficit. She has been taking ibuprofen for pain  control and denies being on any other anticoagulants. Upon arrival to the ER she had a blood pressure of 86/54 that responded to IV fluid resuscitation Hemoglobin is 10.2 and appears stable CT scan of the left lower extremity shows mild interval increase in the size of the hemorrhage within the subcutaneous soft tissues lateral to the left proximal femur arthroplasty. Hematoma currently measures 18.5 x 7.3 x 9.2 cm, previously 13.7 x 6.5 x 9.3 cm. Smaller area of hemorrhage/fluid along the posterior margin of the proximal femur is without significant change, currently measuring 7.9 x 3.2 x 5.5 cm. No new collections.  No evidence of prosthesis loosening. She has been seen in consultation by orthopedic surgery with plans for irrigation and debridement of the wound in a.m. She will be admitted to the hospital for further evaluation  2/19.  Okay to have gabapentin and Cymbalta this morning.  Patient going to the operating room today for hematoma evacuation and inspection to see if any bleeding.  2/21: Vitals normal.  S/p incision and drainage and wound VAC placement for left hip hematoma on 05/11/2022.  Patient with still significant pain limiting mobility, she wants to go home to maximum home health services ordered.  Would like to spend another night to keep working with PT. 2/22: Hemodynamically stable but continued to have very limited mobility due to pain and wound VAC in place.  PT changed the recommendations to SNF and patient now agrees. 2/23: Vital stable with mild tachycardia and blood pressure at 141/73, increasing the dose of home carvedilol to 12.5 mg twice daily.  Obtained insurance authorization  for her to go to rehab for further management. Patient is being discharged to rehab.  She will need dressing change as needed and emptying of her wound VAC every other day or as needed.  Patient will continue on current medications and need to have a close follow-up with her providers for further  recommendations.  Assessment and Plan: * Hematoma of left hip With recent hip surgery.  Patient went to the OR 2/19 for evacuation of hematoma.  Today's hemoglobin 9.1.  Patient will go home with a wound VAC.   -PT is recommending home health.  Hypotension Blood pressure now within goal. Keep holding lisinopril Coreg and amlodipine.-We will restart as needed  Major depressive disorder, recurrent, moderate (HCC) Continue duloxetine and trazodone  Hypokalemia Oral potassium supplementation today  Gastroesophageal reflux disease with hiatal hernia Continue PPI  Overweight (BMI 25.0-29.9) BMI 27.46  Hypertension, essential, benign Blood pressure within goal.  Initially home antihypertensives were held due to hypotension. -Restart home antihypertensives as needed-currently being held   Pain control - [place] was reviewed. and patient was instructed, not to drive, operate heavy machinery, perform activities at heights, swimming or participation in water activities or provide baby-sitting services while on Pain, Sleep and Anxiety Medications; until their outpatient Physician has advised to do so again. Also recommended to not to take more than prescribed Pain, Sleep and Anxiety Medications.  Consultants: Orthopedic surgery Procedures performed: Incision and drainage of hematoma and wound vac placement Disposition: Skilled nursing facility Diet recommendation:  Discharge Diet Orders (From admission, onward)     Start     Ordered   05/15/22 0000  Diet - low sodium heart healthy        05/15/22 1407           Cardiac diet DISCHARGE MEDICATION: Allergies as of 05/15/2022   No Known Allergies      Medication List     STOP taking these medications    cephALEXin 500 MG capsule Commonly known as: KEFLEX   enoxaparin 40 MG/0.4ML injection Commonly known as: LOVENOX   HYDROcodone-acetaminophen 5-325 MG tablet Commonly  known as: NORCO/VICODIN   ibuprofen 200 MG tablet Commonly known as: ADVIL       TAKE these medications    acetaminophen 500 MG tablet Commonly known as: TYLENOL Take 1,000 mg by mouth 2 (two) times daily.   alendronate 70 MG tablet Commonly known as: FOSAMAX Take 70 mg by mouth once a week. Take with a full glass of water on an empty stomach. Wednesdays   amLODipine 5 MG tablet Commonly known as: NORVASC Take 5 mg by mouth every evening.   aspirin 81 MG chewable tablet Chew 1 tablet (81 mg total) by mouth 2 (two) times daily.   b complex vitamins capsule Take 1 capsule by mouth daily.   Biotin 10000 MCG Tabs Take 10,000 mcg by mouth daily.   carvedilol 6.25 MG tablet Commonly known as: COREG Take 6.25 mg by mouth 2 (two) times daily.   celecoxib 200 MG capsule Commonly known as: CELEBREX Take 1 capsule (200 mg total) by mouth 2 (two) times daily.   cetirizine 10 MG tablet Commonly known as: ZYRTEC Take 10 mg by mouth daily as needed for allergies.   DULoxetine HCl 60 MG Csdr Take 60 mg by mouth every morning.   gabapentin 600 MG tablet Commonly known as: NEURONTIN Take 600-1,200 mg by mouth See admin instructions. Take 1200 mg in the morning and 600  mg noon and 1200 mg at bedtime   lisinopril 40 MG tablet Commonly known as: ZESTRIL Take 40 mg by mouth every morning.   multivitamin with minerals tablet Take 1 tablet by mouth daily. Senior   oxyCODONE 5 MG immediate release tablet Commonly known as: Oxy IR/ROXICODONE Take 1 tablet (5 mg total) by mouth every 4 (four) hours as needed for moderate pain (pain score 4-6).   SLOW FE PO Take 1 tablet by mouth daily.   traMADol 50 MG tablet Commonly known as: ULTRAM Take 1-2 tablets (50-100 mg total) by mouth every 4 (four) hours as needed for moderate pain.   traZODone 50 MG tablet Commonly known as: DESYREL Take 50 mg by mouth at bedtime.   vitamin D3 25 MCG (1000 UT) tablet Generic drug:  Cholecalciferol Take 1,000 Units by mouth daily.               Durable Medical Equipment  (From admission, onward)           Start     Ordered   05/11/22 1856  DME Walker rolling  Once       Question:  Patient needs a walker to treat with the following condition  Answer:  S/P total hip arthroplasty   05/11/22 1855   05/11/22 1856  DME Bedside commode  Once       Comments: Patient is not able to walk the distance required to go the bathroom, or he/she is unable to safely negotiate stairs required to access the bathroom.  A 3in1 BSC will alleviate this problem  Question:  Patient needs a bedside commode to treat with the following condition  Answer:  S/P total hip arthroplasty   05/11/22 1855              Discharge Care Instructions  (From admission, onward)           Start     Ordered   05/15/22 0000  Discharge wound care:       Comments: Please change dressing as needed. Empty wound VAC every other day or as needed   05/15/22 1407            Follow-up Information     Reche Dixon, PA-C Follow up in 2 week(s).   Specialty: Orthopedic Surgery Why: Staple removal and wound VAC removal Contact information: [address] [place] [address] [phone]         Barbaraann Boys, MD. Schedule an appointment as soon as possible for a visit in 1 week(s).   Specialty: Pediatrics Contact information: [address] [place] [address] [phone]                Discharge Exam: Filed Weights   05/10/22 0738 05/11/22 1450  Weight: 70.3 kg 70.3 kg   General.     In no acute distress. Pulmonary.  Lungs clear bilaterally, normal respiratory effort. CV.  Regular rate and rhythm, no JVD, rub or murmur. Abdomen.  Soft, nontender, nondistended, BS positive. CNS.  Alert and oriented .  No focal neurologic deficit. Extremities.  No edema, no cyanosis, pulses intact and symmetrical.  Wound VAC in place Psychiatry.   Judgment and insight appears normal.   Condition at discharge: stable  The results of significant diagnostics from this hospitalization (including imaging, microbiology, ancillary and laboratory) are listed below for reference.   Imaging Studies: CT Hip Left W and/or Wo Contrast  Result Date: 05/10/2022 CLINICAL DATA:  Soft  tissue mass. Concern for expanding postop hematoma. EXAM: CT OF THE LOWER LEFT EXTREMITY WITHOUT CONTRAST TECHNIQUE: Multidetector CT imaging of the lower left extremity was performed following the standard protocol before and during bolus administration of intravenous contrast. RADIATION DOSE REDUCTION: This exam was performed according to the departmental dose-optimization program which includes automated exposure control, adjustment of the mA and/or kV according to patient size and/or use of iterative reconstruction technique. CONTRAST:  174m OMNIPAQUE IOHEXOL 300 MG/ML  SOLN COMPARISON:  CT hip, 05/03/2022. FINDINGS: Bones/Joint/Cartilage No fracture. Left total hip arthroplasty appears well seated and aligned. No findings to suggest infection or prosthesis loosening. No convincing hip joint effusion. Ligaments Suboptimally assessed by CT. Muscles and Tendons No evidence of a muscle injury.  Tendons grossly intact. Soft tissues Hyperattenuating irregular fluid collection lateral soft tissues, extending from the skin at the level of the assist in line centrally to the muscular fascia overlying the proximal femur. Collection currently measures 18.5 cm superior to inferior by 9.2 x 7.3 cm transversely, increased in size from the prior exam where it measured 13.7 x 6.5 x 9.3 cm. The mixed attenuation fluid collection along the posterior margin the proximal femur is similar, spanning 7.9 cm superior to inferior by 5.5 x 3.2 cm transversely, previously 10 x 3.0 x 5 cm. No new fluid collection.  No evidence of active hemorrhage. Partly imaged uterus with evidence of a heterogeneous  presumed fibroid measuring 6.3 cm, unchanged. IMPRESSION: 1. Mild interval increase in the size of the hemorrhage within the subcutaneous soft tissues lateral to the left proximal femur arthroplasty. Hematoma currently measures 18.5 x 7.3 x 9.2 cm, previously 13.7 x 6.5 x 9.3 cm. Smaller area of hemorrhage/fluid along the posterior margin of the proximal femur is without significant change, currently measuring 7.9 x 3.2 x 5.5 cm. 2. No new collections.  No evidence of prosthesis loosening. Electronically Signed   By: DLajean ManesM.D.   On: 05/10/2022 09:44   CT Hip Left Wo Contrast  Result Date: 05/03/2022 CLINICAL DATA:  Hip replacement, infection suspected EXAM: CT OF THE LEFT HIP WITHOUT CONTRAST TECHNIQUE: Multidetector CT imaging of the left hip was performed according to the standard protocol. Multiplanar CT image reconstructions were also generated. RADIATION DOSE REDUCTION: This exam was performed according to the departmental dose-optimization program which includes automated exposure control, adjustment of the mA and/or kV according to patient size and/or use of iterative reconstruction technique. COMPARISON:  X-ray 04/15/2022 FINDINGS: Bones/Joint/Cartilage Postsurgical changes from left total hip arthroplasty. Arthroplasty components are in their expected alignment without dislocation. No periprosthetic fracture. Cystic changes adjacent to the superior aspect of the acetabular cup likely represent subchondral cystic change from chronic hip joint arthropathy. No findings to suggest mechanical loosening. The imaged portion of the left bony pelvis is intact without fracture or diastasis. 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 Ligaments Suboptimally assessed by CT. Muscles and Tendons No acute musculotendinous abnormality by CT. Soft tissues Large hyperdense fluid collection within the subcutaneous soft tissues along the posterolateral aspect of the left hip and proximal  thigh. Approximate measurements of 13.7 x 6.5 x 9.3 cm (volume = 430 cm^3). This collection extends along the surgical incision site to closely approximate the overlying skin surface. Hyperdense periprosthetic fluid collection along the posterior margin of the proximal left femur measuring approximately 10.0 x 3.0 x 5.0 cm (volume = 79 cm^3) (series 6, image 67). Mild edema within the subcutaneous soft tissues of the left hip. No soft tissue  gas. No inguinal lymphadenopathy. IMPRESSION: 1. Postsurgical changes from left total hip arthroplasty without periprosthetic fracture or evidence of mechanical loosening. 2. Large hyperdense fluid collection within the subcutaneous soft tissues along the posterolateral aspect of the left hip and proximal thigh measuring up to 13.7 cm, likely representing a hematoma. 3. Hyperdense periprosthetic fluid collection along the posterior margin of the proximal left femur measuring up to 10 cm, also likely representing a hematoma. 4. The sterility of these collections is indeterminate by CT. Electronically Signed   By: Davina Poke D.O.   On: 05/03/2022 13:55    Microbiology: Results for orders placed or performed during the hospital encounter of 04/02/22  Surgical pcr screen     Status: None   Collection Time: 04/02/22 11:48 AM   Specimen: Nasal Mucosa; Nasal Swab  Result Value Ref Range Status   MRSA, PCR NEGATIVE NEGATIVE Final   Staphylococcus aureus NEGATIVE NEGATIVE Final    Comment: (NOTE) The Xpert SA Assay (FDA approved for NASAL specimens in patients 61 years of age and older), is one component of a comprehensive surveillance program. It is not intended to diagnose infection nor to guide or monitor treatment. Performed at [facility], [address] [place], [place] [address]     Labs: CBC: Recent Labs  Lab 05/10/22 [phone] 05/10/22 1744 05/11/22 0604 05/13/22 0925  WBC 10.5  --   --  5.9  NEUTROABS 8.5*  --   --   --   HGB 10.2*  9.2* 9.0* 9.1*  HCT 32.4* 29.1* 28.6* 28.6*  MCV 99.1  --   --  96.3  PLT 384  --   --  99991111   Basic Metabolic Panel: Recent Labs  Lab 05/10/22 0759 05/11/22 0604 05/13/22 0925  NA 133* 136 134*  K 3.8 3.4* 4.3  CL 106 111 109  CO2 16* 18* 20*  GLUCOSE 68* 85 79  BUN 26* 24* 22  CREATININE 0.98 0.67 0.66  CALCIUM 8.1* 7.6* 8.1*   Liver Function Tests: Recent Labs  Lab 05/10/22 0759  AST 26  ALT 17  ALKPHOS 69  BILITOT 0.6  PROT 5.7*  ALBUMIN 2.7*   CBG: No results for input(s): "GLUCAP" in the last 168 hours.  Discharge time spent: greater than 30 minutes.  This record has been created using Systems analyst. Errors have been sought and corrected,but may not always be located. Such creation errors do not reflect on the standard of care.   Signed: Lorella Nimrod, MD [facility] 05/15/2022

## 2022-05-15 NOTE — Progress Notes (Signed)
  Subjective: 4 Days Post-Op Procedure(s) (LRB): INCISION AND DRAINAGE EVACUATION HEMATOMA LEFT HIP (Left) APPLICATION OF WOUND VAC  [id number] (Left) Patient reports pain as mild to moderate.   Patient is well, and has had no acute complaints or problems Plan is to go rehab after hospital stay.  Depending on physical therapy ambulation and assistance at home Negative for chest pain and shortness of breath Fever: no Gastrointestinal: Negative for nausea and vomiting  Objective: Vital signs in last 24 hours: Temp:  [97.5 F (36.4 C)-98.9 F (37.2 C)] 97.5 F (36.4 C) (02/23 0012) Pulse Rate:  [79-100] 79 (02/23 0012) Resp:  [18-19] 18 (02/23 0012) BP: (136-142)/(71-85) 136/75 (02/23 0012) SpO2:  [98 %-100 %] 98 % (02/23 0012)  Intake/Output from previous day:  Intake/Output Summary (Last 24 hours) at 05/15/2022 0653 Last data filed at 05/15/2022 0600 Gross per 24 hour  Intake 100 ml  Output 50 ml  Net 50 ml    Intake/Output this shift: Total I/O In: 100 [P.O.:100] Out: 50 [Drains:50]  Labs: Recent Labs    05/13/22 0925  HGB 9.1*   Recent Labs    05/13/22 0925  WBC 5.9  RBC 2.97*  HCT 28.6*  PLT 319   Recent Labs    05/13/22 0925  NA 134*  K 4.3  CL 109  CO2 20*  BUN 22  CREATININE 0.66  GLUCOSE 79  CALCIUM 8.1*   No results for input(s): "LABPT", "INR" in the last 72 hours.    EXAM General - Patient is Alert and Oriented Extremity - Neurovascular intact Sensation intact distally Dorsiflexion/Plantar flexion intact Compartment soft Dressing/Incision - clean, dry, with the wound VAC in place.  Motor Function - intact, moving foot and toes well on exam.  Ambulated 80 feet with physical therapy yesterday.  Past Medical History:  Diagnosis Date   Abnormal EKG    Anemia    Arthritis    Depression    Fibromyalgia    GERD (gastroesophageal reflux disease)    History of hiatal hernia    Hypertension    Scoliosis    had trouble getting spinal in  for TKA in 2022   Tachycardia     Assessment/Plan: 4 Days Post-Op Procedure(s) (LRB): INCISION AND DRAINAGE EVACUATION HEMATOMA LEFT HIP (Left) APPLICATION OF WOUND VAC  [id number] (Left) Principal Problem:   Hematoma of left hip Active Problems:   Gastroesophageal reflux disease with hiatal hernia   Hypertension, essential, benign   Major depressive disorder, recurrent, moderate (HCC)   Overweight (BMI 25.0-29.9)   Hypokalemia   Hypotension  Estimated body mass index is 27.45 kg/m as calculated from the following:   Height as of this encounter: 5' 3"$  (1.6 m).   Weight as of this encounter: 70.3 kg. Advance diet Up with therapy  Discharge planning: Follow-up at [facility] clinic orthopedics in 2 weeks for wound care and staple removal.  DVT Prophylaxis - Aspirin, Foot Pumps, and TED hose Weight-Bearing as tolerated to left leg  Reche Dixon, PA-C Orthopaedic Surgery 05/15/2022, 6:53 AM

## 2022-05-15 NOTE — Progress Notes (Signed)
EMS here to transport patient to [facility]. Personal belongings sent with patient. Stable condition.

## 2022-05-15 NOTE — Progress Notes (Signed)
Physical Therapy Treatment Patient Details Name: Gina Matthews MRN: [id number] DOB: Oct 02, 1950 Today's Date: 05/15/2022   History of Present Illness Pt admitted for L hip hematoma and is POD 1 s/p I&D for evacuation of hematoma. PMH includes fibromyalgia, HTN, GERD, depression and recent L THR on 04/15/22. Currently active with HHPT.    PT Comments    Pt able to demonstrate increased distance with gait training this session to 119f with RW and CGA. Continues to require standing rest breaks due to fatigue and is not safe to return home until more functional progress is made. Therefore, continue to recommend SNF once medically cleared for d/c.   Recommendations for follow up therapy are one component of a multi-disciplinary discharge planning process, led by the attending physician.  Recommendations may be updated based on patient status, additional functional criteria and insurance authorization.  Follow Up Recommendations  Skilled nursing-short term rehab (<3 hours/day) Can patient physically be transported by private vehicle: Yes   Assistance Recommended at Discharge Intermittent Supervision/Assistance  Patient can return home with the following A little help with walking and/or transfers;A little help with bathing/dressing/bathroom;Help with stairs or ramp for entrance   Equipment Recommendations  None recommended by PT    Recommendations for Other Services       Precautions / Restrictions Precautions Precautions: Fall;Posterior Hip Precaution Booklet Issued: No Restrictions Weight Bearing Restrictions: Yes LLE Weight Bearing: Weight bearing as tolerated     Mobility  Bed Mobility Overal bed mobility: Needs Assistance Bed Mobility: Supine to Sit     Supine to sit: Min guard, HOB elevated          Transfers Overall transfer level: Needs assistance Equipment used: Rolling walker (2 wheels) Transfers: Sit to/from Stand Sit to Stand: Min guard            General transfer comment: improved technique with no cues given. RW used with extremely flexed posture once standing    Ambulation/Gait Ambulation/Gait assistance: Min guard Gait Distance (Feet):  (120) Assistive device: Rolling walker (2 wheels) Gait Pattern/deviations: Step-through pattern       General Gait Details: Several standing rest breaks due to fatigue   Stairs             Wheelchair Mobility    Modified Rankin (Stroke Patients Only)       Balance Overall balance assessment: Needs assistance Sitting-balance support: Feet supported Sitting balance-Leahy Scale: Good     Standing balance support: Bilateral upper extremity supported, Reliant on assistive device for balance, During functional activity Standing balance-Leahy Scale: Fair                              Cognition Arousal/Alertness: Awake/alert Behavior During Therapy: WFL for tasks assessed/performed Overall Cognitive Status: Within Functional Limits for tasks assessed                                 General Comments: pleasant and agreeable to session        Exercises General Exercises - Lower Extremity Ankle Circles/Pumps: AROM, Both, 10 reps Long Arc Quad: AROM, Both, 10 reps Other Exercises Other Exercises: Pt educated on safe use of RW during functional mobility and proper sequencing    General Comments General comments (skin integrity, edema, etc.): Wound vac intact      Pertinent Vitals/Pain Pain Assessment Pain Assessment: 0-10 Pain Score: 4  Pain  Location: L hip Pain Descriptors / Indicators: Grimacing Pain Intervention(s): Limited activity within patient's tolerance, Premedicated before session    Home Living                          Prior Function            PT Goals (current goals can now be found in the care plan section) Acute Rehab PT Goals Patient Stated Goal: to go to SNF    Frequency    7X/week      PT Plan  Discharge plan needs to be updated    Co-evaluation              AM-PAC PT "6 Clicks" Mobility   Outcome Measure  Help needed turning from your back to your side while in a flat bed without using bedrails?: A Little Help needed moving from lying on your back to sitting on the side of a flat bed without using bedrails?: A Little Help needed moving to and from a bed to a chair (including a wheelchair)?: A Little Help needed standing up from a chair using your arms (e.g., wheelchair or bedside chair)?: A Little Help needed to walk in hospital room?: A Little Help needed climbing 3-5 steps with a railing? : A Lot 6 Click Score: 17    End of Session Equipment Utilized During Treatment: Gait belt Activity Tolerance: Patient tolerated treatment well;Patient limited by fatigue Patient left: in chair;with call bell/phone within reach;with chair alarm set Nurse Communication: Mobility status PT Visit Diagnosis: Muscle weakness (generalized) (M62.81);Difficulty in walking, not elsewhere classified (R26.2);Pain Pain - Right/Left: Left Pain - part of body: Hip     Time: [id number] PT Time Calculation (min) (ACUTE ONLY): 31 min  Charges:  $Gait Training: 8-22 mins $Therapeutic Exercise: 8-22 mins                    Mikel Cella, PTA    [name] 05/15/2022, 4:29 PM

## 2022-06-16 ENCOUNTER — Other Ambulatory Visit
Admission: RE | Admit: 2022-06-16 | Discharge: 2022-06-16 | Disposition: A | Discharge status: Home / Self Care | Payer: Medicare Other | Source: Ambulatory Visit | Attending: Orthopedic Surgery | Admitting: Orthopedic Surgery

## 2022-06-16 DIAGNOSIS — S7002XD Contusion of left hip, subsequent encounter: Secondary | ICD-10-CM | POA: Diagnosis present

## 2022-06-21 LAB — AEROBIC/ANAEROBIC CULTURE W GRAM STAIN (SURGICAL/DEEP WOUND)

## 2022-07-10 ENCOUNTER — Other Ambulatory Visit: Payer: Self-pay | Admitting: Pediatrics

## 2022-07-10 DIAGNOSIS — M810 Age-related osteoporosis without current pathological fracture: Secondary | ICD-10-CM

## 2022-07-17 ENCOUNTER — Other Ambulatory Visit: Payer: Medicare Other

## 2022-08-28 ENCOUNTER — Other Ambulatory Visit
Admission: RE | Admit: 2022-08-28 | Discharge: 2022-08-28 | Disposition: A | Discharge status: Home / Self Care | Payer: Medicare Other | Source: Ambulatory Visit | Attending: Student | Admitting: Student

## 2022-08-28 DIAGNOSIS — S7002XD Contusion of left hip, subsequent encounter: Secondary | ICD-10-CM | POA: Insufficient documentation

## 2022-08-28 LAB — SYNOVIAL CELL COUNT + DIFF, W/ CRYSTALS
Crystals, Fluid: NONE SEEN
Eosinophils-Synovial: 0 %
Lymphocytes-Synovial Fld: 17 %
Monocyte-Macrophage-Synovial Fluid: 4 %
Neutrophil, Synovial: 79 %
WBC, Synovial: UNDETERMINED /mm3 (ref 0–200)

## 2022-09-01 NOTE — Progress Notes (Signed)
Patient called to same day surgery and states she did not receive any pre surgery information about what she is to do prior to arrival.  This RN went over her medications and pre surgery instructions.  Patient with verbal understanding.  Will arrive at 2 pm tomorrow afternoon.  Patient understanding of ability to drink clear liquids up to 12 noon.

## 2022-09-02 ENCOUNTER — Inpatient Hospital Stay
Admission: AD | Admit: 2022-09-02 | Discharge: 2022-09-07 | DRG: 466 | Disposition: A | Discharge status: Home Health | Payer: Medicare Other | Attending: Orthopedic Surgery | Admitting: Orthopedic Surgery

## 2022-09-02 ENCOUNTER — Encounter
Admission: AD | Disposition: A | Discharge status: Home Health | Payer: Self-pay | Source: Home / Self Care | Attending: Orthopedic Surgery

## 2022-09-02 ENCOUNTER — Encounter: Payer: Self-pay | Admitting: Orthopedic Surgery

## 2022-09-02 ENCOUNTER — Inpatient Hospital Stay: Payer: Medicare Other

## 2022-09-02 ENCOUNTER — Ambulatory Visit: Payer: Medicare Other

## 2022-09-02 ENCOUNTER — Other Ambulatory Visit: Payer: Self-pay

## 2022-09-02 DIAGNOSIS — Z79899 Other long term (current) drug therapy: Secondary | ICD-10-CM

## 2022-09-02 DIAGNOSIS — M419 Scoliosis, unspecified: Secondary | ICD-10-CM | POA: Diagnosis present

## 2022-09-02 DIAGNOSIS — I10 Essential (primary) hypertension: Secondary | ICD-10-CM | POA: Diagnosis present

## 2022-09-02 DIAGNOSIS — Z8349 Family history of other endocrine, nutritional and metabolic diseases: Secondary | ICD-10-CM | POA: Diagnosis not present

## 2022-09-02 DIAGNOSIS — F32A Depression, unspecified: Secondary | ICD-10-CM | POA: Diagnosis present

## 2022-09-02 DIAGNOSIS — M797 Fibromyalgia: Secondary | ICD-10-CM | POA: Diagnosis present

## 2022-09-02 DIAGNOSIS — Z8249 Family history of ischemic heart disease and other diseases of the circulatory system: Secondary | ICD-10-CM

## 2022-09-02 DIAGNOSIS — Y831 Surgical operation with implant of artificial internal device as the cause of abnormal reaction of the patient, or of later complication, without mention of misadventure at the time of the procedure: Secondary | ICD-10-CM | POA: Diagnosis present

## 2022-09-02 DIAGNOSIS — T8452XA Infection and inflammatory reaction due to internal left hip prosthesis, initial encounter: Principal | ICD-10-CM

## 2022-09-02 DIAGNOSIS — H9319 Tinnitus, unspecified ear: Secondary | ICD-10-CM | POA: Diagnosis present

## 2022-09-02 DIAGNOSIS — Z8614 Personal history of Methicillin resistant Staphylococcus aureus infection: Secondary | ICD-10-CM

## 2022-09-02 DIAGNOSIS — Z7983 Long term (current) use of bisphosphonates: Secondary | ICD-10-CM

## 2022-09-02 DIAGNOSIS — T8452XD Infection and inflammatory reaction due to internal left hip prosthesis, subsequent encounter: Secondary | ICD-10-CM

## 2022-09-02 DIAGNOSIS — R578 Other shock: Secondary | ICD-10-CM | POA: Diagnosis not present

## 2022-09-02 DIAGNOSIS — D62 Acute posthemorrhagic anemia: Secondary | ICD-10-CM | POA: Diagnosis not present

## 2022-09-02 DIAGNOSIS — R000 Tachycardia, unspecified: Secondary | ICD-10-CM | POA: Diagnosis not present

## 2022-09-02 DIAGNOSIS — Z8619 Personal history of other infectious and parasitic diseases: Secondary | ICD-10-CM | POA: Diagnosis not present

## 2022-09-02 DIAGNOSIS — I96 Gangrene, not elsewhere classified: Secondary | ICD-10-CM | POA: Diagnosis present

## 2022-09-02 DIAGNOSIS — Z96651 Presence of right artificial knee joint: Secondary | ICD-10-CM | POA: Diagnosis present

## 2022-09-02 DIAGNOSIS — K219 Gastro-esophageal reflux disease without esophagitis: Secondary | ICD-10-CM | POA: Diagnosis present

## 2022-09-02 DIAGNOSIS — Z833 Family history of diabetes mellitus: Secondary | ICD-10-CM | POA: Diagnosis not present

## 2022-09-02 DIAGNOSIS — Z818 Family history of other mental and behavioral disorders: Secondary | ICD-10-CM | POA: Diagnosis not present

## 2022-09-02 DIAGNOSIS — Z96642 Presence of left artificial hip joint: Secondary | ICD-10-CM

## 2022-09-02 DIAGNOSIS — A4902 Methicillin resistant Staphylococcus aureus infection, unspecified site: Secondary | ICD-10-CM | POA: Diagnosis not present

## 2022-09-02 DIAGNOSIS — R579 Shock, unspecified: Secondary | ICD-10-CM

## 2022-09-02 DIAGNOSIS — E875 Hyperkalemia: Secondary | ICD-10-CM | POA: Diagnosis present

## 2022-09-02 DIAGNOSIS — E876 Hypokalemia: Secondary | ICD-10-CM

## 2022-09-02 HISTORY — PX: INCISION AND DRAINAGE HIP: SHX1801

## 2022-09-02 LAB — CK: Total CK: 73 U/L (ref 38–234)

## 2022-09-02 LAB — COMPREHENSIVE METABOLIC PANEL
ALT: 18 U/L (ref 0–44)
AST: 21 U/L (ref 15–41)
Albumin: 3.7 g/dL (ref 3.5–5.0)
Alkaline Phosphatase: 101 U/L (ref 38–126)
Anion gap: 11 (ref 5–15)
BUN: 20 mg/dL (ref 8–23)
CO2: 19 mmol/L — ABNORMAL LOW (ref 22–32)
Calcium: 8.6 mg/dL — ABNORMAL LOW (ref 8.9–10.3)
Chloride: 101 mmol/L (ref 98–111)
Creatinine, Ser: 0.83 mg/dL (ref 0.44–1.00)
GFR, Estimated: >60 mL/min (ref >60)
Glucose, Bld: 73 mg/dL (ref 70–99)
Potassium: 5.1 mmol/L (ref 3.5–5.1)
Sodium: 131 mmol/L — ABNORMAL LOW (ref 135–145)
Total Bilirubin: 0.5 mg/dL (ref 0.3–1.2)
Total Protein: 7.1 g/dL (ref 6.5–8.1)

## 2022-09-02 LAB — CBC WITH DIFFERENTIAL/PLATELET
Abs Immature Granulocytes: 0.02 10*3/uL (ref 0.00–0.07)
Basophils Absolute: 0 10*3/uL (ref 0.0–0.1)
Basophils Relative: 1 %
Eosinophils Absolute: 0.1 10*3/uL (ref 0.0–0.5)
Eosinophils Relative: 2 %
HCT: 32.6 % — ABNORMAL LOW (ref 36.0–46.0)
Hemoglobin: 10 g/dL — ABNORMAL LOW (ref 12.0–15.0)
Immature Granulocytes: 0 %
Lymphocytes Relative: 28 %
Lymphs Abs: 1.6 10*3/uL (ref 0.7–4.0)
MCH: 27.5 pg (ref 26.0–34.0)
MCHC: 30.7 g/dL (ref 30.0–36.0)
MCV: 89.6 fL (ref 80.0–100.0)
Monocytes Absolute: 0.7 10*3/uL (ref 0.1–1.0)
Monocytes Relative: 12 %
Neutro Abs: 3.2 10*3/uL (ref 1.7–7.7)
Neutrophils Relative %: 57 %
Platelets: 401 10*3/uL — ABNORMAL HIGH (ref 150–400)
RBC: 3.64 MIL/uL — ABNORMAL LOW (ref 3.87–5.11)
RDW: 15.9 % — ABNORMAL HIGH (ref 11.5–15.5)
WBC: 5.7 10*3/uL (ref 4.0–10.5)
nRBC: 0 % (ref 0.0–0.2)

## 2022-09-02 LAB — CBC
HCT: 22.1 % — ABNORMAL LOW (ref 36.0–46.0)
Hemoglobin: 6.6 g/dL — ABNORMAL LOW (ref 12.0–15.0)
MCH: 27.4 pg (ref 26.0–34.0)
MCHC: 29.9 g/dL — ABNORMAL LOW (ref 30.0–36.0)
MCV: 91.7 fL (ref 80.0–100.0)
Platelets: 281 K/uL (ref 150–400)
RBC: 2.41 MIL/uL — ABNORMAL LOW (ref 3.87–5.11)
RDW: 15.6 % — ABNORMAL HIGH (ref 11.5–15.5)
WBC: 6.9 K/uL (ref 4.0–10.5)
nRBC: 0 % (ref 0.0–0.2)

## 2022-09-02 LAB — SEDIMENTATION RATE: Sed Rate: 67 mm/hr — ABNORMAL HIGH (ref 0–30)

## 2022-09-02 LAB — C-REACTIVE PROTEIN: CRP: 5.5 mg/dL — ABNORMAL HIGH (ref <1.0)

## 2022-09-02 LAB — GLUCOSE, CAPILLARY: Glucose-Capillary: 124 mg/dL — ABNORMAL HIGH (ref 70–99)

## 2022-09-02 LAB — PREPARE RBC (CROSSMATCH)

## 2022-09-02 SURGERY — IRRIGATION AND DEBRIDEMENT HIP WITH POLY EXCHANGE
Anesthesia: General | Site: Hip | Laterality: Left

## 2022-09-02 MED ORDER — GABAPENTIN 600 MG PO TABS: 600.0000 mg | ORAL_TABLET | ORAL | Status: DC

## 2022-09-02 MED ORDER — TRANEXAMIC ACID-NACL 1000-0.7 MG/100ML-% IV SOLN
1000.0000 mg | Freq: Once | INTRAVENOUS | Status: AC
  Administered 2022-09-02: 1000 mg via INTRAVENOUS

## 2022-09-02 MED ORDER — STERILE WATER FOR IRRIGATION IR SOLN
Status: DC | PRN
  Administered 2022-09-02: 100 mL

## 2022-09-02 MED ORDER — ACETAMINOPHEN 10 MG/ML IV SOLN
1000.0000 mg | Freq: Four times a day (QID) | INTRAVENOUS | Status: AC
  Administered 2022-09-03 (×4): 1000 mg via INTRAVENOUS
  Filled 2022-09-02 (×4): qty 100

## 2022-09-02 MED ORDER — MENTHOL 3 MG MT LOZG: 1.0000 | LOZENGE | OROMUCOSAL | Status: DC | PRN

## 2022-09-02 MED ORDER — TRANEXAMIC ACID-NACL 1000-0.7 MG/100ML-% IV SOLN
1000.0000 mg | INTRAVENOUS | Status: AC
  Administered 2022-09-02: 1000 mg via INTRAVENOUS

## 2022-09-02 MED ORDER — ASPIRIN 81 MG PO CHEW
81.0000 mg | CHEWABLE_TABLET | Freq: Two times a day (BID) | ORAL | Status: DC
  Administered 2022-09-03 – 2022-09-07 (×10): 81 mg via ORAL
  Filled 2022-09-02 (×10): qty 1

## 2022-09-02 MED ORDER — CEFAZOLIN SODIUM-DEXTROSE 2-4 GM/100ML-% IV SOLN
INTRAVENOUS | Status: AC
  Filled 2022-09-02: qty 100

## 2022-09-02 MED ORDER — DULOXETINE HCL 30 MG PO CPEP
60.0000 mg | ORAL_CAPSULE | Freq: Every day | ORAL | Status: DC
  Administered 2022-09-03 – 2022-09-07 (×5): 60 mg via ORAL
  Filled 2022-09-02 (×5): qty 2

## 2022-09-02 MED ORDER — PHENOL 1.4 % MT LIQD: 1.0000 | OROMUCOSAL | Status: DC | PRN

## 2022-09-02 MED ORDER — MAGNESIUM HYDROXIDE 400 MG/5ML PO SUSP
30.0000 mL | Freq: Every day | ORAL | Status: DC
  Administered 2022-09-03 – 2022-09-07 (×3): 30 mL via ORAL
  Filled 2022-09-02 (×5): qty 30

## 2022-09-02 MED ORDER — CHLORHEXIDINE GLUCONATE 0.12 % MT SOLN
15.0000 mL | Freq: Once | OROMUCOSAL | Status: AC
  Administered 2022-09-02: 15 mL via OROMUCOSAL

## 2022-09-02 MED ORDER — SODIUM CHLORIDE 0.9 % IV SOLN: INTRAVENOUS | Status: DC

## 2022-09-02 MED ORDER — SURGIPHOR WOUND IRRIGATION SYSTEM - OPTIME
TOPICAL | Status: DC | PRN
  Administered 2022-09-02: 450 mL

## 2022-09-02 MED ORDER — VANCOMYCIN HCL IN DEXTROSE 1-5 GM/200ML-% IV SOLN
INTRAVENOUS | Status: AC
  Filled 2022-09-02: qty 200

## 2022-09-02 MED ORDER — ALBUMIN HUMAN 5 % IV SOLN
INTRAVENOUS | Status: AC
  Filled 2022-09-02: qty 250

## 2022-09-02 MED ORDER — ACETAMINOPHEN 325 MG PO TABS
325.0000 mg | ORAL_TABLET | Freq: Four times a day (QID) | ORAL | Status: DC | PRN
  Administered 2022-09-04: 650 mg via ORAL
  Filled 2022-09-02: qty 2

## 2022-09-02 MED ORDER — FENTANYL CITRATE (PF) 100 MCG/2ML IJ SOLN
INTRAMUSCULAR | Status: AC
  Filled 2022-09-02: qty 2

## 2022-09-02 MED ORDER — SODIUM CHLORIDE 0.9% IV SOLUTION: Freq: Once | INTRAVENOUS | Status: DC

## 2022-09-02 MED ORDER — PHENYLEPHRINE 80 MCG/ML (10ML) SYRINGE FOR IV PUSH (FOR BLOOD PRESSURE SUPPORT)
PREFILLED_SYRINGE | INTRAVENOUS | Status: AC
  Filled 2022-09-02: qty 10

## 2022-09-02 MED ORDER — SODIUM CHLORIDE 0.9 % IR SOLN
Status: DC | PRN
  Administered 2022-09-02 (×3): 3000 mL

## 2022-09-02 MED ORDER — HYDROGEN PEROXIDE 3 % EX SOLN
CUTANEOUS | Status: DC | PRN
  Administered 2022-09-02: 1

## 2022-09-02 MED ORDER — TRAMADOL HCL 50 MG PO TABS
50.0000 mg | ORAL_TABLET | ORAL | Status: DC | PRN
  Administered 2022-09-03: 100 mg via ORAL
  Administered 2022-09-03: 50 mg via ORAL
  Administered 2022-09-03 – 2022-09-04 (×2): 100 mg via ORAL
  Administered 2022-09-04: 50 mg via ORAL
  Administered 2022-09-04 – 2022-09-05 (×2): 100 mg via ORAL
  Filled 2022-09-02: qty 2
  Filled 2022-09-02: qty 1
  Filled 2022-09-02: qty 2
  Filled 2022-09-02: qty 1
  Filled 2022-09-02 (×3): qty 2

## 2022-09-02 MED ORDER — ACETAMINOPHEN 10 MG/ML IV SOLN
INTRAVENOUS | Status: DC | PRN
  Administered 2022-09-02: 1000 mg via INTRAVENOUS

## 2022-09-02 MED ORDER — ONDANSETRON HCL 4 MG/2ML IJ SOLN: 4.0000 mg | Freq: Once | INTRAMUSCULAR | Status: DC | PRN

## 2022-09-02 MED ORDER — ENSURE ENLIVE PO LIQD
237.0000 mL | Freq: Two times a day (BID) | ORAL | Status: DC
  Administered 2022-09-03 – 2022-09-07 (×8): 237 mL via ORAL

## 2022-09-02 MED ORDER — GABAPENTIN 300 MG PO CAPS
600.0000 mg | ORAL_CAPSULE | Freq: Every day | ORAL | Status: DC
  Administered 2022-09-03 – 2022-09-07 (×5): 600 mg via ORAL
  Filled 2022-09-02 (×5): qty 2

## 2022-09-02 MED ORDER — ROCURONIUM BROMIDE 100 MG/10ML IV SOLN
INTRAVENOUS | Status: DC | PRN
  Administered 2022-09-02: 20 mg via INTRAVENOUS

## 2022-09-02 MED ORDER — VITAMIN C 500 MG PO TABS
1000.0000 mg | ORAL_TABLET | Freq: Every day | ORAL | Status: DC
  Administered 2022-09-03 – 2022-09-07 (×5): 1000 mg via ORAL
  Filled 2022-09-02 (×5): qty 2

## 2022-09-02 MED ORDER — ONDANSETRON HCL 4 MG/2ML IJ SOLN: 4.0000 mg | Freq: Four times a day (QID) | INTRAMUSCULAR | Status: DC | PRN

## 2022-09-02 MED ORDER — DEXMEDETOMIDINE HCL IN NACL 80 MCG/20ML IV SOLN
INTRAVENOUS | Status: DC | PRN
  Administered 2022-09-02: 8 ug via INTRAVENOUS
  Administered 2022-09-02: 4 ug via INTRAVENOUS

## 2022-09-02 MED ORDER — ALUM & MAG HYDROXIDE-SIMETH 200-200-20 MG/5ML PO SUSP: 30.0000 mL | ORAL | Status: DC | PRN

## 2022-09-02 MED ORDER — CHLORHEXIDINE GLUCONATE 0.12 % MT SOLN
OROMUCOSAL | Status: AC
  Filled 2022-09-02: qty 15

## 2022-09-02 MED ORDER — PHENYLEPHRINE HCL (PRESSORS) 10 MG/ML IV SOLN
INTRAVENOUS | Status: DC | PRN
  Administered 2022-09-02 (×5): 160 ug via INTRAVENOUS
  Administered 2022-09-02 (×4): 80 ug via INTRAVENOUS

## 2022-09-02 MED ORDER — AMLODIPINE BESYLATE 5 MG PO TABS: 5.0000 mg | ORAL_TABLET | Freq: Every evening | ORAL | Status: DC

## 2022-09-02 MED ORDER — EPHEDRINE SULFATE (PRESSORS) 50 MG/ML IJ SOLN
INTRAMUSCULAR | Status: DC | PRN
  Administered 2022-09-02: 10 mg via INTRAVENOUS
  Administered 2022-09-02: 5 mg via INTRAVENOUS
  Administered 2022-09-02: 10 mg via INTRAVENOUS

## 2022-09-02 MED ORDER — CHLORHEXIDINE GLUCONATE 4 % EX SOLN: 60.0000 mL | Freq: Once | CUTANEOUS | Status: DC

## 2022-09-02 MED ORDER — FERROUS SULFATE 325 (65 FE) MG PO TABS
325.0000 mg | ORAL_TABLET | Freq: Two times a day (BID) | ORAL | Status: DC
  Administered 2022-09-03 – 2022-09-07 (×9): 325 mg via ORAL
  Filled 2022-09-02 (×9): qty 1

## 2022-09-02 MED ORDER — OXYCODONE HCL 5 MG PO TABS
ORAL_TABLET | ORAL | Status: AC
  Filled 2022-09-02: qty 1

## 2022-09-02 MED ORDER — PROPOFOL 10 MG/ML IV BOLUS
INTRAVENOUS | Status: AC
  Filled 2022-09-02: qty 20

## 2022-09-02 MED ORDER — OXYCODONE HCL 5 MG/5ML PO SOLN: 5.0000 mg | Freq: Once | ORAL | Status: AC | PRN

## 2022-09-02 MED ORDER — DIPHENHYDRAMINE HCL 12.5 MG/5ML PO ELIX: 12.5000 mg | ORAL_SOLUTION | ORAL | Status: DC | PRN

## 2022-09-02 MED ORDER — VANCOMYCIN HCL 1000 MG IV SOLR
INTRAVENOUS | Status: DC | PRN
  Administered 2022-09-02: 1000 mg

## 2022-09-02 MED ORDER — ONDANSETRON HCL 4 MG PO TABS: 4.0000 mg | ORAL_TABLET | Freq: Four times a day (QID) | ORAL | Status: DC | PRN

## 2022-09-02 MED ORDER — CELECOXIB 200 MG PO CAPS
200.0000 mg | ORAL_CAPSULE | Freq: Two times a day (BID) | ORAL | Status: DC
Start: 2022-09-03 — End: 2022-09-02

## 2022-09-02 MED ORDER — LIDOCAINE HCL (CARDIAC) PF 100 MG/5ML IV SOSY
PREFILLED_SYRINGE | INTRAVENOUS | Status: DC | PRN
  Administered 2022-09-02: 60 mg via INTRAVENOUS

## 2022-09-02 MED ORDER — LACTATED RINGERS IV SOLN: INTRAVENOUS | Status: DC

## 2022-09-02 MED ORDER — SODIUM CHLORIDE 0.9 % IV SOLN
8.0000 mg/kg | Freq: Every day | INTRAVENOUS | Status: DC
  Administered 2022-09-03 – 2022-09-07 (×5): 500 mg via INTRAVENOUS
  Filled 2022-09-02 (×5): qty 10

## 2022-09-02 MED ORDER — 0.9 % SODIUM CHLORIDE (POUR BTL) OPTIME
TOPICAL | Status: DC | PRN
  Administered 2022-09-02: 1000 mL

## 2022-09-02 MED ORDER — HYDROMORPHONE HCL 1 MG/ML IJ SOLN
INTRAMUSCULAR | Status: AC
  Filled 2022-09-02: qty 1

## 2022-09-02 MED ORDER — TRAZODONE HCL 50 MG PO TABS
50.0000 mg | ORAL_TABLET | Freq: Every day | ORAL | Status: DC
  Administered 2022-09-03 – 2022-09-06 (×5): 50 mg via ORAL
  Filled 2022-09-02 (×5): qty 1

## 2022-09-02 MED ORDER — GENTAMICIN SULFATE 40 MG/ML IJ SOLN
INTRAMUSCULAR | Status: DC | PRN
  Administered 2022-09-02: 240 mg

## 2022-09-02 MED ORDER — ALBUMIN HUMAN 5 % IV SOLN: INTRAVENOUS | Status: DC | PRN

## 2022-09-02 MED ORDER — PROPOFOL 10 MG/ML IV BOLUS
INTRAVENOUS | Status: DC | PRN
  Administered 2022-09-02: 20 mg via INTRAVENOUS
  Administered 2022-09-02: 120 mg via INTRAVENOUS

## 2022-09-02 MED ORDER — FENTANYL CITRATE (PF) 100 MCG/2ML IJ SOLN
25.0000 ug | INTRAMUSCULAR | Status: AC | PRN
  Administered 2022-09-02 (×6): 25 ug via INTRAVENOUS

## 2022-09-02 MED ORDER — PHENYLEPHRINE HCL-NACL 20-0.9 MG/250ML-% IV SOLN
INTRAVENOUS | Status: DC | PRN
  Administered 2022-09-02: 20 ug/min via INTRAVENOUS

## 2022-09-02 MED ORDER — OXYCODONE HCL 5 MG PO TABS
5.0000 mg | ORAL_TABLET | ORAL | Status: DC | PRN
  Administered 2022-09-03 – 2022-09-05 (×2): 5 mg via ORAL
  Filled 2022-09-02 (×2): qty 1

## 2022-09-02 MED ORDER — VANCOMYCIN HCL IN DEXTROSE 1-5 GM/200ML-% IV SOLN
1000.0000 mg | Freq: Once | INTRAVENOUS | Status: AC
  Administered 2022-09-02 (×2): 1000 mg via INTRAVENOUS

## 2022-09-02 MED ORDER — LISINOPRIL 20 MG PO TABS: 40.0000 mg | ORAL_TABLET | Freq: Every day | ORAL | Status: DC

## 2022-09-02 MED ORDER — ADULT MULTIVITAMIN W/MINERALS CH
1.0000 | ORAL_TABLET | Freq: Every day | ORAL | Status: DC
  Administered 2022-09-03 – 2022-09-07 (×5): 1 via ORAL
  Filled 2022-09-02 (×5): qty 1

## 2022-09-02 MED ORDER — ACETAMINOPHEN 10 MG/ML IV SOLN
INTRAVENOUS | Status: AC
  Filled 2022-09-02: qty 100

## 2022-09-02 MED ORDER — HYDROMORPHONE HCL 1 MG/ML IJ SOLN
0.5000 mg | INTRAMUSCULAR | Status: DC | PRN
  Administered 2022-09-02: 1 mg via INTRAVENOUS

## 2022-09-02 MED ORDER — BISACODYL 10 MG RE SUPP: 10.0000 mg | Freq: Every day | RECTAL | Status: DC | PRN

## 2022-09-02 MED ORDER — METOCLOPRAMIDE HCL 5 MG PO TABS
10.0000 mg | ORAL_TABLET | Freq: Three times a day (TID) | ORAL | Status: AC
  Administered 2022-09-03 – 2022-09-04 (×8): 10 mg via ORAL
  Filled 2022-09-02 (×4): qty 1
  Filled 2022-09-02: qty 2
  Filled 2022-09-02 (×2): qty 1
  Filled 2022-09-02: qty 2
  Filled 2022-09-02: qty 1

## 2022-09-02 MED ORDER — LORATADINE 10 MG PO TABS
10.0000 mg | ORAL_TABLET | Freq: Every day | ORAL | Status: DC
  Administered 2022-09-03 – 2022-09-07 (×5): 10 mg via ORAL
  Filled 2022-09-02 (×5): qty 1

## 2022-09-02 MED ORDER — SENNOSIDES-DOCUSATE SODIUM 8.6-50 MG PO TABS
1.0000 | ORAL_TABLET | Freq: Two times a day (BID) | ORAL | Status: DC
  Administered 2022-09-03 – 2022-09-07 (×6): 1 via ORAL
  Filled 2022-09-02 (×9): qty 1

## 2022-09-02 MED ORDER — SUCCINYLCHOLINE CHLORIDE 200 MG/10ML IV SOSY
PREFILLED_SYRINGE | INTRAVENOUS | Status: DC | PRN
  Administered 2022-09-02: 80 mg via INTRAVENOUS

## 2022-09-02 MED ORDER — ENSURE PRE-SURGERY PO LIQD
296.0000 mL | Freq: Once | ORAL | Status: DC
  Filled 2022-09-02: qty 296

## 2022-09-02 MED ORDER — ACETAMINOPHEN 10 MG/ML IV SOLN: 1000.0000 mg | Freq: Once | INTRAVENOUS | Status: DC | PRN

## 2022-09-02 MED ORDER — GENTAMICIN SULFATE 40 MG/ML IJ SOLN
INTRAMUSCULAR | Status: AC
  Filled 2022-09-02: qty 6

## 2022-09-02 MED ORDER — TRANEXAMIC ACID-NACL 1000-0.7 MG/100ML-% IV SOLN
INTRAVENOUS | Status: AC
  Filled 2022-09-02: qty 100

## 2022-09-02 MED ORDER — FLEET ENEMA 7-19 GM/118ML RE ENEM: 1.0000 | ENEMA | Freq: Once | RECTAL | Status: DC | PRN

## 2022-09-02 MED ORDER — NEOMYCIN-POLYMYXIN B GU 40-200000 IR SOLN
Status: AC
  Filled 2022-09-02: qty 20

## 2022-09-02 MED ORDER — SODIUM CHLORIDE 0.9 % IV SOLN
0.0000 ug/min | INTRAVENOUS | Status: DC
  Administered 2022-09-02: 30 ug/min via INTRAVENOUS
  Filled 2022-09-02: qty 2

## 2022-09-02 MED ORDER — GABAPENTIN 300 MG PO CAPS
ORAL_CAPSULE | ORAL | Status: AC
  Filled 2022-09-02: qty 4

## 2022-09-02 MED ORDER — HYDROMORPHONE HCL 1 MG/ML IJ SOLN
INTRAMUSCULAR | Status: DC | PRN
  Administered 2022-09-02 (×2): .3 mg via INTRAVENOUS
  Administered 2022-09-02 (×2): .2 mg via INTRAVENOUS

## 2022-09-02 MED ORDER — ONDANSETRON HCL 4 MG/2ML IJ SOLN
INTRAMUSCULAR | Status: DC | PRN
  Administered 2022-09-02: 4 mg via INTRAVENOUS

## 2022-09-02 MED ORDER — EPHEDRINE 5 MG/ML INJ
INTRAVENOUS | Status: AC
  Filled 2022-09-02: qty 5

## 2022-09-02 MED ORDER — CARVEDILOL 6.25 MG PO TABS: 6.2500 mg | ORAL_TABLET | Freq: Two times a day (BID) | ORAL | Status: DC

## 2022-09-02 MED ORDER — PANTOPRAZOLE SODIUM 40 MG PO TBEC
40.0000 mg | DELAYED_RELEASE_TABLET | Freq: Two times a day (BID) | ORAL | Status: DC
  Administered 2022-09-03 – 2022-09-07 (×10): 40 mg via ORAL
  Filled 2022-09-02 (×10): qty 1

## 2022-09-02 MED ORDER — LACTATED RINGERS IV SOLN: INTRAVENOUS | Status: DC | PRN

## 2022-09-02 MED ORDER — CEFAZOLIN SODIUM-DEXTROSE 2-4 GM/100ML-% IV SOLN
2.0000 g | INTRAVENOUS | Status: AC
  Administered 2022-09-02: 2 g via INTRAVENOUS

## 2022-09-02 MED ORDER — GABAPENTIN 400 MG PO CAPS
1200.0000 mg | ORAL_CAPSULE | Freq: Two times a day (BID) | ORAL | Status: DC
  Administered 2022-09-02 – 2022-09-07 (×10): 1200 mg via ORAL
  Filled 2022-09-02 (×9): qty 3

## 2022-09-02 MED ORDER — FENTANYL CITRATE (PF) 100 MCG/2ML IJ SOLN
INTRAMUSCULAR | Status: DC | PRN
  Administered 2022-09-02 (×4): 50 ug via INTRAVENOUS
  Administered 2022-09-02: 25 ug via INTRAVENOUS
  Administered 2022-09-02: 50 ug via INTRAVENOUS
  Administered 2022-09-02: 25 ug via INTRAVENOUS

## 2022-09-02 MED ORDER — OXYCODONE HCL 5 MG PO TABS
10.0000 mg | ORAL_TABLET | ORAL | Status: DC | PRN
  Administered 2022-09-03 – 2022-09-07 (×20): 10 mg via ORAL
  Filled 2022-09-02 (×21): qty 2

## 2022-09-02 MED ORDER — ORAL CARE MOUTH RINSE: 15.0000 mL | Freq: Once | OROMUCOSAL | Status: AC

## 2022-09-02 MED ORDER — SODIUM CHLORIDE 0.9 % IR SOLN
Status: DC | PRN
  Administered 2022-09-02: 3000 mL

## 2022-09-02 MED ORDER — CELECOXIB 200 MG PO CAPS
200.0000 mg | ORAL_CAPSULE | Freq: Two times a day (BID) | ORAL | Status: DC
  Administered 2022-09-03: 200 mg via ORAL
  Filled 2022-09-02 (×3): qty 1

## 2022-09-02 MED ORDER — OXYCODONE HCL 5 MG PO TABS
5.0000 mg | ORAL_TABLET | Freq: Once | ORAL | Status: AC | PRN
  Administered 2022-09-02: 5 mg via ORAL

## 2022-09-02 SURGICAL SUPPLY — 74 items
BIT DRILL Q/COUPLING 1 (BIT) IMPLANT
BRUSH SCRUB EZ PLAIN DRY (MISCELLANEOUS) ×1 IMPLANT
CANISTER WOUND CARE 500ML ATS (WOUND CARE) IMPLANT
CNTNR URN SCR LID CUP LEK RST (MISCELLANEOUS) ×1 IMPLANT
CONT SPEC 4OZ STRL OR WHT (MISCELLANEOUS) ×3
COVER BACK TABLE REUSABLE LG (DRAPES) ×1 IMPLANT
COVER LIGHT HANDLE STERIS (MISCELLANEOUS) ×2 IMPLANT
DRAPE 3/4 80X56 (DRAPES) ×2 IMPLANT
DRAPE INCISE IOBAN 66X60 STRL (DRAPES) ×1 IMPLANT
DRESSING PEEL AND PLAC PRVNA20 (GAUZE/BANDAGES/DRESSINGS) IMPLANT
DRSG AQUACEL AG ADV 3.5X14 (GAUZE/BANDAGES/DRESSINGS) ×1 IMPLANT
DRSG MEPILEX SACRM 8.7X9.8 (GAUZE/BANDAGES/DRESSINGS) ×1 IMPLANT
DRSG NON-ADHERENT DERMACEA 3X4 (GAUZE/BANDAGES/DRESSINGS) ×1 IMPLANT
DRSG PEEL AND PLACE PREVENA 20 (GAUZE/BANDAGES/DRESSINGS) ×1
DRSG TEGADERM 4X4.75 (GAUZE/BANDAGES/DRESSINGS) ×1 IMPLANT
DURAPREP 26ML APPLICATOR (WOUND CARE) ×2 IMPLANT
ELECT CAUTERY BLADE 6.4 (BLADE) ×1 IMPLANT
ELECT REM PT RETURN 9FT ADLT (ELECTROSURGICAL) ×1
ELECTRODE REM PT RTRN 9FT ADLT (ELECTROSURGICAL) ×1 IMPLANT
GLOVE BIOGEL M STRL SZ7.5 (GLOVE) ×4 IMPLANT
GLOVE SRG 8 PF TXTR STRL LF DI (GLOVE) ×2 IMPLANT
GLOVE SURG UNDER POLY LF SZ8 (GLOVE) ×2
GOWN STRL REUS W/ TWL XL LVL3 (GOWN DISPOSABLE) ×1 IMPLANT
GOWN STRL REUS W/TWL XL LVL3 (GOWN DISPOSABLE) ×1
GOWN TOGA ZIPPER T7+ PEEL AWAY (MISCELLANEOUS) ×1 IMPLANT
HANDLE YANKAUER SUCT OPEN TIP (MISCELLANEOUS) ×1 IMPLANT
HANDPIECE VERSAJET DEBRIDEMENT (MISCELLANEOUS) IMPLANT
HEAD M SROM 36MM PLUS 1.5 (Hips) IMPLANT
HEMOVAC 400CC 10FR (MISCELLANEOUS) IMPLANT
HOLDER FOLEY CATH W/STRAP (MISCELLANEOUS) ×1 IMPLANT
HOLSTER ELECTROSUGICAL PENCIL (MISCELLANEOUS) ×1 IMPLANT
HOOD PEEL AWAY T7 (MISCELLANEOUS) ×1 IMPLANT
HYDROGEN PEROXIDE 16OZ (MISCELLANEOUS) IMPLANT
IV NS IRRIG 3000ML ARTHROMATIC (IV SOLUTION) ×1 IMPLANT
KIT PEG BOARD PINK (KITS) ×1 IMPLANT
KIT PREVENA INCISION MGT20CM45 (CANNISTER) IMPLANT
KIT STIMULAN RAPID CURE 5CC (Orthopedic Implant) IMPLANT
LINER MARATHON 10D 36X54 (Hips) IMPLANT
LINER MARATHON 10DEG 36X54 (Hips) ×1 IMPLANT
MANIFOLD NEPTUNE II (INSTRUMENTS) ×2 IMPLANT
NDL SAFETY ECLIP 18X1.5 (MISCELLANEOUS) ×1 IMPLANT
NS IRRIG 1000ML POUR BTL (IV SOLUTION) IMPLANT
NS IRRIG 500ML POUR BTL (IV SOLUTION) ×1 IMPLANT
PACK HIP PROSTHESIS (MISCELLANEOUS) ×1 IMPLANT
PENCIL SMOKE EVACUATOR COATED (MISCELLANEOUS) ×1 IMPLANT
PULSAVAC PLUS IRRIG FAN TIP (DISPOSABLE) ×1
SOL .9 NS 3000ML IRR AL (IV SOLUTION)
SOL .9 NS 3000ML IRR UROMATIC (IV SOLUTION) ×1 IMPLANT
SOL PREP PVP 2OZ (MISCELLANEOUS) ×2
SOLUTION IRRIG SURGIPHOR (IV SOLUTION) ×1 IMPLANT
SOLUTION PREP PVP 2OZ (MISCELLANEOUS) ×1 IMPLANT
SPONGE DRAIN TRACH 4X4 STRL 2S (GAUZE/BANDAGES/DRESSINGS) ×1 IMPLANT
SPONGE T-LAP 18X18 ~~LOC~~+RFID (SPONGE) IMPLANT
SROM M HEAD 36MM PLUS 1.5 (Hips) ×1 IMPLANT
STAPLER SKIN PROX 35W (STAPLE) ×1 IMPLANT
SUCTION TUBE FRAZIER 10FR DISP (SUCTIONS) ×1 IMPLANT
SUT ETHIBOND #5 BRAIDED 30INL (SUTURE) ×1 IMPLANT
SUT MNCRL 0 1X36 CT-1 (SUTURE) IMPLANT
SUT MON AB 2-0 CT1 36 (SUTURE) IMPLANT
SUT PDS AB 1 CT1 36 (SUTURE) IMPLANT
SUT VIC AB 0 CT1 36 (SUTURE) ×1 IMPLANT
SUT VIC AB 1 CT1 36 (SUTURE) ×2 IMPLANT
SUT VIC AB 2-0 CT1 27 (SUTURE)
SUT VIC AB 2-0 CT1 TAPERPNT 27 (SUTURE) ×1 IMPLANT
SWAB CULTURE AMIES ANAERIB BLU (MISCELLANEOUS) ×1 IMPLANT
SYR 20ML LL LF (SYRINGE) ×1 IMPLANT
TAPE CLOTH 3X10 WHT NS LF (GAUZE/BANDAGES/DRESSINGS) ×1 IMPLANT
TIP BRUSH PULSAVAC PLUS 24.33 (MISCELLANEOUS) IMPLANT
TIP FAN IRRIG PULSAVAC PLUS (DISPOSABLE) ×1 IMPLANT
TOWEL OR 17X26 4PK STRL BLUE (TOWEL DISPOSABLE) ×1 IMPLANT
TRAP FLUID SMOKE EVACUATOR (MISCELLANEOUS) ×1 IMPLANT
TRAY FOLEY MTR SLVR 16FR STAT (SET/KITS/TRAYS/PACK) ×1 IMPLANT
WATER STERILE IRR 1000ML POUR (IV SOLUTION) ×1 IMPLANT
WATER STERILE IRR 500ML POUR (IV SOLUTION) IMPLANT

## 2022-09-02 NOTE — Progress Notes (Signed)
Pharmacy Antibiotic Note  Gina Matthews is a 72 y.o. female admitted on 09/02/2022 with wound infection.  Pharmacy has been consulted for Daptomycin dosing.  Plan: Daptomycin 8 mg/kg (TBW) q1400 per indication, BMI & renal fxn.  Pt given Vancomycin 1000 mg once 6/12 @ 1618  Pharmacy will continue to follow and will adjust abx dosing whenever warranted.  Temp (24hrs), Avg:97.4 F (36.3 C), Min:97.3 F (36.3 C), Max:97.5 F (36.4 C)   Recent Labs  Lab 09/02/22 1454 09/02/22 2137  WBC 5.7 6.9  CREATININE 0.83  --     Estimated Creatinine Clearance: 57.3 mL/min (by C-G formula based on SCr of 0.83 mg/dL).    No Known Allergies  Antimicrobials this admission: 6/12 Cefazolin >> x 1 dose 6/12 Vancomycin >> x 1 dose 6/13 Daptomycin >>  Microbiology results: 6/12 BCx: Pending 6/12 Surgical Cx: No organisms seen x 3 samples  Thank you for allowing pharmacy to be a part of this patient's care.  Otelia Sergeant, PharmD, [facility] 09/02/2022 11:45 PM

## 2022-09-02 NOTE — Transfer of Care (Signed)
Immediate Anesthesia Transfer of Care Note  Patient: Gina Matthews  Procedure(s) Performed: IRRIGATION AND DEBRIDEMENT HIP WITH POLY EXCHANGE (Left: Hip)  Patient Location: PACU  Anesthesia Type:General  Level of Consciousness: awake, alert , and patient cooperative  Airway & Oxygen Therapy: Patient Spontanous Breathing and Patient connected to nasal cannula oxygen  Post-op Assessment: Report given to RN and Post -op Vital signs reviewed and stable  Post vital signs: Reviewed and stable  Last Vitals:  Vitals Value Taken Time  BP 103/59 09/02/22 2140  Temp 36.4 C 09/02/22 2123  Pulse 89 09/02/22 2140  Resp 16 09/02/22 2144  SpO2 98 % 09/02/22 2140  Vitals shown include unvalidated device data.  Last Pain:  Vitals:   09/02/22 1417  TempSrc: Oral  PainSc: 0-No pain      Patients Stated Pain Goal: 0 (09/02/22 1417)  Complications: No notable events documented.

## 2022-09-02 NOTE — Anesthesia Preprocedure Evaluation (Signed)
Anesthesia Evaluation  Patient identified by MRN, date of birth, ID band Patient awake  General Assessment Comment:  Prior anesthetic for initial hip surgery showed: " DL x 2 as pt had anterior, deep airway.  Able to get view of arytenoids and use a drivable bougie to place ETT.  BBS, ETCO2 noted.  O2 sats maintained throughout attempts."  Subsequent general anesthetic, intubation was achieved with Glidescope 3, one attempt, no issues documented.  Reviewed: Allergy & Precautions, NPO status , Patient's Chart, lab work & pertinent test results  History of Anesthesia Complications (+) DIFFICULT AIRWAY and history of anesthetic complications  Airway Mallampati: III  TM Distance: >3 FB Neck ROM: Limited    Dental  (+) Implants   Pulmonary neg pulmonary ROS, neg sleep apnea, neg COPD, Patient abstained from smoking.Not current smoker   Pulmonary exam normal breath sounds clear to auscultation       Cardiovascular Exercise Tolerance: Good METShypertension, Pt. on medications (-) CAD and (-) Past MI (-) dysrhythmias  Rhythm:Regular Rate:Normal - Systolic murmurs    Neuro/Psych  PSYCHIATRIC DISORDERS  Depression    negative neurological ROS     GI/Hepatic ,GERD  Controlled,,(+)     (-) substance abuse    Endo/Other  neg diabetes    Renal/GU negative Renal ROS     Musculoskeletal  (+) Arthritis ,  Fibromyalgia -  Abdominal   Peds  Hematology   Anesthesia Other Findings Past Medical History: No date: Abnormal EKG No date: Anemia No date: Arthritis No date: Depression No date: Fibromyalgia No date: GERD (gastroesophageal reflux disease) No date: History of hiatal hernia No date: Hypertension No date: Scoliosis     Comment:  had trouble getting spinal in for TKA in 2022 No date: Tachycardia  Reproductive/Obstetrics                             Anesthesia Physical Anesthesia Plan  ASA:  2  Anesthesia Plan: General   Post-op Pain Management: Ofirmev IV (intra-op)*   Induction: Intravenous  PONV Risk Score and Plan: 3 and Ondansetron, Dexamethasone and Treatment may vary due to age or medical condition  Airway Management Planned: Oral ETT and Video Laryngoscope Planned  Additional Equipment: None  Intra-op Plan:   Post-operative Plan: Extubation in OR  Informed Consent: I have reviewed the patients History and Physical, chart, labs and discussed the procedure including the risks, benefits and alternatives for the proposed anesthesia with the patient or authorized representative who has indicated his/her understanding and acceptance.     Dental advisory given  Plan Discussed with: CRNA and Surgeon  Anesthesia Plan Comments: (Plan for GETA, using Glidescope 3 given as that is what worked on first attempt on most recent documented anesthetic.  Discussed risks of anesthesia with patient, including PONV, sore throat, lip/dental/eye damage. Rare risks discussed as well, such as cardiorespiratory and neurological sequelae, and allergic reactions. Discussed the role of CRNA in patient's perioperative care. Patient understands.)       Anesthesia Quick Evaluation

## 2022-09-02 NOTE — Interval H&P Note (Signed)
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  The various methods of treatment have been discussed with the patient and family. After consideration of risks, benefits and other options for treatment, the patient has consented to  Procedure(s): IRRIGATION AND DEBRIDEMENT POSTERIOR HIP (Left) as a surgical intervention.  The patient's history has been reviewed, patient examined, no change in status, stable for surgery.  I have reviewed the patient's chart and labs.  Questions were answered to the patient's satisfaction.     Eiman Maret P Linsey Arteaga

## 2022-09-02 NOTE — Anesthesia Procedure Notes (Signed)
Procedure Name: Intubation Date/Time: 09/02/2022 4:31 PM  Performed by: Allayne Butcher, RNPre-anesthesia Checklist: Patient identified, Patient being monitored, Timeout performed, Emergency Drugs available and Suction available Patient Re-evaluated:Patient Re-evaluated prior to induction Oxygen Delivery Method: Circle system utilized Preoxygenation: Pre-oxygenation with 100% oxygen Induction Type: IV induction Ventilation: Mask ventilation without difficulty Laryngoscope Size: 3 and Glidescope Grade View: Grade II Tube type: Oral Tube size: 7.0 mm Number of attempts: 2 Airway Equipment and Method: Stylet Placement Confirmation: ETT inserted through vocal cords under direct vision, positive ETCO2 and breath sounds checked- equal and bilateral Secured at: 22 cm Tube secured with: Tape Dental Injury: Teeth and Oropharynx as per pre-operative assessment  Comments: AIRWAY FOUND TO BE ANTERIOR. STYLET MOLDED TO FIT CURVE OF GLIDESCOPE LOPRO 3 BLADE WITH SUCCESS. REGULAR GLIDESCOPE STYLET DID NOT HAVE ENOUGH OF A CURVE TO ADVANCE THROUGH CORDS.

## 2022-09-02 NOTE — Consult Note (Signed)
NAME: Gina Matthews  DOB: [birth year]  MRN: [id number]  Date/Time: 09/02/2022 2:37 PM  REQUESTING PROVIDER: Dr.Hooten Subjective:  REASON FOR CONSULT: left hip PJI  ? Gina Matthews is a 72 y.o. female with a history of rt TKA, HTN underwent left THA in 04/20/22 , this was complicated by hematoma which was evacuated on 05/11/22.on a follow up visit on 05/29/22 a culture was sent and it was MRSA- she was given bactrim for a few days in March- The surgical site healed well except for one area which continued to discharge- this was at the lower end and she had a vac dressing In May she was placed on bactrim and had taken the entire month The past few days she noted a new area bubbling up on the top half and she saw Dr.Hooten on 08/28/22 and it was aspirated and sent for culture   Latest Reference Range & Units 08/28/22 15:29  Color, Synovial YELLOW  RED !  Appearance-Synovial CLEAR  TURBID !  Crystals, Fluid  NO CRYSTALS SEEN  WBC, Synovial 0 - 200 /cu mm UNABLE TO PERFORM COUNT DUE TO CLOT IN SPECIMEN  Neutrophil, Synovial % 79  Lymphocytes-Synovial Fld % 17  Monocyte-Macrophage-Synovial Fluid % 4  Eosinophils-Synovial % 0  Culture Ng  I am asked to see her for the infection She has no fever or chills Some pain left hip She lives on her own ( husband had passed on due to AD)    Past Medical History:  Diagnosis Date   Abnormal EKG    Anemia    Arthritis    Depression    Fibromyalgia    GERD (gastroesophageal reflux disease)    History of hiatal hernia    Hypertension    Scoliosis    had trouble getting spinal in for TKA in 2022   Tachycardia     Past Surgical History:  Procedure Laterality Date   APPLICATION OF WOUND VAC Left 05/11/2022   Procedure: APPLICATION OF WOUND VAC  [id number];  Surgeon: Donato Heinz, MD;  Location: [facility] ORS;  Service: Orthopedics;  Laterality: Left;  [id number]   CESAREAN SECTION     x 4   COSMETIC SURGERY     lip injury   FRACTURE SURGERY Right     foot ORIF-age 47   HERNIA REPAIR Left    inguinal   INCISION AND DRAINAGE Left 05/11/2022   Procedure: INCISION AND DRAINAGE EVACUATION HEMATOMA LEFT HIP;  Surgeon: Donato Heinz, MD;  Location: [facility] ORS;  Service: Orthopedics;  Laterality: Left;   KNEE ARTHROPLASTY Right 01/27/2021   Procedure: COMPUTER ASSISTED TOTAL KNEE ARTHROPLASTY;  Surgeon: Donato Heinz, MD;  Location: [facility] ORS;  Service: Orthopedics;  Laterality: Right;   MOUTH SURGERY     implants   TONSILLECTOMY     TOTAL HIP ARTHROPLASTY Left 04/15/2022   Procedure: TOTAL HIP ARTHROPLASTY;  Surgeon: Donato Heinz, MD;  Location: [facility] ORS;  Service: Orthopedics;  Laterality: Left;    Social History   Socioeconomic History   Marital status: Widowed    Spouse name: Not on file   Number of children: Not on file   Years of education: Not on file   Highest education level: Not on file  Occupational History   Not on file  Tobacco Use   Smoking status: Never   Smokeless tobacco: Never  Vaping Use   Vaping Use: Never used  Substance and Sexual Activity   Alcohol use: Never   Drug use: Never  Sexual activity: Not on file  Other Topics Concern   Not on file  Social History Narrative   Not on file   Social Determinants of Health   Financial Resource Strain: Not on file  Food Insecurity: No Food Insecurity (05/10/2022)   Hunger Vital Sign    Worried About Running Out of Food in the Last Year: Never true    Ran Out of Food in the Last Year: Never true  Transportation Needs: No Transportation Needs (05/10/2022)   PRAPARE - Administrator, Civil Service (Medical): No    Lack of Transportation (Non-Medical): No  Physical Activity: Not on file  Stress: Not on file  Social Connections: Not on file  Intimate Partner Violence: Not At Risk (05/10/2022)   Humiliation, Afraid, Rape, and Kick questionnaire    Fear of Current or Ex-Partner: No    Emotionally Abused: No    Physically Abused: No    Sexually  Abused: No    Family History  Problem Relation Age of Onset   Breast cancer Neg Hx    No Known Allergies I? Current Facility-Administered Medications  Medication Dose Route Frequency Provider Last Rate Last Admin   ceFAZolin (ANCEF) IVPB 2g/100 mL premix  2 g Intravenous On Call to OR Hooten, Illene Labrador, MD       chlorhexidine (HIBICLENS) 4 % liquid 4 Application  60 mL Topical Once Hooten, Illene Labrador, MD       chlorhexidine (PERIDEX) 0.12 % solution 15 mL  15 mL Mouth/Throat Once Stephanie Coup, MD       Or   Oral care mouth rinse  15 mL Mouth Rinse Once Stephanie Coup, MD       feeding supplement (ENSURE PRE-SURGERY) liquid 296 mL  296 mL Oral Once Hooten, Illene Labrador, MD       lactated ringers infusion   Intravenous Continuous Stephanie Coup, MD       tranexamic acid (CYKLOKAPRON) IVPB 1,000 mg  1,000 mg Intravenous To OR Hooten, Illene Labrador, MD       vancomycin (VANCOCIN) IVPB 1000 mg/200 mL premix  1,000 mg Intravenous Once Hooten, Illene Labrador, MD         Abtx:  Anti-infectives (From admission, onward)    Start     Dose/Rate Route Frequency Ordered Stop   09/02/22 1430  ceFAZolin (ANCEF) IVPB 2g/100 mL premix        2 g 200 mL/hr over 30 Minutes Intravenous On call to O.R. 09/02/22 1413 09/03/22 0559   09/02/22 1415  vancomycin (VANCOCIN) IVPB 1000 mg/200 mL premix        1,000 mg 200 mL/hr over 60 Minutes Intravenous  Once 09/02/22 1413         REVIEW OF SYSTEMS:  Const: negative fever, negative chills, negative weight loss Eyes: negative diplopia or visual changes, negative eye pain ENT: negative coryza, negative sore throat Resp: negative cough, hemoptysis, dyspnea Cards: negative for chest pain, palpitations, lower extremity edema GU: negative for frequency, dysuria and hematuria GI: Negative for abdominal pain, diarrhea, bleeding, constipation Skin: negative for rash and pruritus Heme: negative for easy bruising and gum/nose bleeding MS: as above Neurolo:negative for  headaches, dizziness, vertigo, memory problems  Psych: negative for feelings of anxiety, depression  Endocrine: negative for thyroid, diabetes Allergy/Immunology- negative for any medication or food allergies ?  Objective:  VITALS:  BP 120/69   Pulse 82   Temp (!) 97.3 F (36.3 C) (Oral)   Resp 18  Ht 5\' 3"  (1.6 m)   Wt 65.3 kg   SpO2 97%   BMI 25.51 kg/m   PHYSICAL EXAM:  General: Alert, cooperative, no distress, appears stated age.  Head: Normocephalic, without obvious abnormality, atraumatic. Eyes: Conjunctivae clear, anicteric sclerae. Pupils are equal ENT Nares normal. No drainage or sinus tenderness. Lips, mucosa, and tongue normal. No Thrush Neck: Supple, symmetrical, no adenopathy, thyroid: non tender no carotid bruit and no JVD. Back: No CVA tenderness. Lungs: Clear to auscultation bilaterally. No Wheezing or Rhonchi. No rales. Heart: Regular rate and rhythm, no murmur, rub or gallop. Abdomen: Soft, non-tender,not distended. Bowel sounds normal. No masses Extremities: left hip surgical site covered with vac dresisng atraumatic, no cyanosis. No edema. No clubbing Skin: No rashes or lesions. Or bruising Lymph: Cervical, supraclavicular normal. Neurologic: Grossly non-focal Pertinent Labs .cbc  Latest Reference Range & Units 09/02/22 14:54  WBC 4.0 - 10.5 K/uL 5.7  RBC 3.87 - 5.11 MIL/uL 3.64 (L)  Hemoglobin 12.0 - 15.0 g/dL 40.9 (L)  HCT 81.1 - 91.4 % 32.6 (L)  MCV 80.0 - 100.0 fL 89.6  MCH 26.0 - 34.0 pg 27.5  MCHC 30.0 - 36.0 g/dL 78.2  RDW 95.6 - 21.3 % 15.9 (H)  Platelets 150 - 400 K/uL 401 (H)  nRBC 0.0 - 0.2 % 0.0      Latest Ref Rng & Units 09/02/2022    2:54 PM 05/13/2022    9:25 AM 05/11/2022    6:04 AM  CMP  Glucose 70 - 99 mg/dL 73  79  85   BUN 8 - 23 mg/dL 20  22  24    Creatinine 0.44 - 1.00 mg/dL 0.86  5.78  4.69   Sodium 135 - 145 mmol/L 131  134  136   Potassium 3.5 - 5.1 mmol/L 5.1  4.3  3.4   Chloride 98 - 111 mmol/L 101  109  111    CO2 22 - 32 mmol/L 19  20  18    Calcium 8.9 - 10.3 mg/dL 8.6  8.1  7.6   Total Protein 6.5 - 8.1 g/dL 7.1     Total Bilirubin 0.3 - 1.2 mg/dL 0.5     Alkaline Phos 38 - 126 U/L 101     AST 15 - 41 U/L 21     ALT 0 - 44 U/L 18       Microbiology: none  IMAGING RESULTS: none ? Impression/Recommendation ?LEft hip prosthetic joint infection- Awaiting exploration, and culture As she had MRSA in culture before will start daptomycin after surgery She is going to need 6 weeks of Iv antibiotic ? ?HTN on lisinopril and coreg   ___________________________________________________ Discussed with patient, requesting provider in detail Note:  This document was prepared using Dragon voice recognition software and may include unintentional dictation errors.

## 2022-09-02 NOTE — Op Note (Signed)
OPERATIVE NOTE  DATE OF SURGERY:  09/02/2022  PATIENT NAME:  Gina Matthews   DOB: July 03, 1950  MRN: [id number]   PRE-OPERATIVE DIAGNOSIS: Left hip surgical site infection status post left total hip arthroplasty  POST-OPERATIVE DIAGNOSIS: Left hip periprosthetic infection (cultures pending)  PROCEDURE: Incision, irrigation, and debridement of the left hip, polyethylene and femoral head exchange, and placement of antibiotic impregnated Stimulan beads  SURGEON:  Jena Gauss., M.D.   ASSISTANT: Gean Birchwood, PA-C  ANESTHESIA: general  ESTIMATED BLOOD LOSS: 400 mL  FLUIDS REPLACED: 2000 mL of crystalloid   250 ml albumin  DRAINS: 2 medium Hemovac drains, Prevena wound VAC  IMPLANTS UTILIZED: Depuy +4 mm 10 degree Marathon polyethylene, 36 mm M-SPEC +1.5 femoral head  INDICATIONS FOR SURGERY: Gina Matthews is a 72 y.o. year old female who underwent left total hip arthroplasty in January 2024.  Her postoperative course was complicated by a large hematoma requiring evacuation in February.  While recovering in a skilled nursing facility, she was noted to have some persistent drainage and erythema.  Superficial swabs were consistent with MRSA and wound care was initiated as well as Bactrim.  The initial wound was healing well but the patient had the sudden onset of redness pain and drainage along the midportion of the incision.  . After discussion of the risks and benefits of surgical intervention, the patient expressed understanding of the risks benefits and agree with plans for incision, irrigation, and debridement of the left hip and possible use of antibiotic impregnated Stimulan beads.Marland Kitchen   PROCEDURE IN DETAIL: The patient was brought to the operating room and, after general endotracheal anesthesia was achieved, the patient was positioned in a right lateral decubitus position.  The left hip and leg were cleaned and prepped with Betadine and draped in usual sterile fashion.  A "timeout"  was performed as per usual protocol.  A lateral curvilinear incision was made in line with the previous surgical scar.  The sinus tract was excised in an elliptical fashion.  The distal wound was healing well without evidence of purulence.  However, the midportion of the incision demonstrated somewhat necrotic appearing tissue.  Specimen was submitted for stat Gram stain and cultures.  The subcutaneous tissue was sharply debrided using a scalpel and then additional debridement performed using the Versajet.  There appeared to be a communication through the muscle fascia suggesting communication with the deep tissues.  It was thus elected to continue the debridement and the deeper spaces.  The IT band was incised in line with the skin incision.  No gross purulent material was noted although there was some soft tissue changes and marked inflammatory changes to the tissue.  Tissue sample was submitted for stat Gram stain culture and sensitivity.  Soft tissue debridement was continued using combination of sharp dissection and the Versajet.  The posterior pseudocapsule was incised so as to visualize the implants.  The femoral head was dislocated posteriorly and the femoral head was dissociated from the trunnion using a bone tamp.  This allowed for better visualization of around the acetabulum.  Soft tissue debridement was performed using electrocautery and curette.  The femoral and acetabular components appeared stable.  The polyethylene insert was removed using a drill and screw technique.  The comforts of the face was further debrided of soft tissue using curettes and rongeurs.  Gross debridement was completed in this fashion.  Next, the wound was irrigated with 3 L of normal saline with antibiotic solution using pulsatile lavage.  This was followed by a 2-minute soak with 100 mL of 3% hydrogen peroxide and 100 and cc of sterile water.  A second 3 L lavage of normal saline was then performed.  Next, 1 L of dilute  sterile Betadine (22.5 mL/L of normal saline (was allowed to soak in the wound for 3 minutes.  This was followed by a third lavage of 3 L of normal saline.  Hemostasis was achieved using electrocautery.  Next, a +4 mm 10 degree Marathon polyethylene liner was positioned with the high side at the 4 o'clock position and impacted into place.  Trial reduction was performed using a 36 mm hip ball with a +1.5 mm neck length.  This allowed for excellent stability both anteriorly and posteriorly.  The trial hip ball was removed and the trunnion was cleaned and dried.  A 36 mm M-SPEC hip ball with a +1.5 mm neck segment was placed on the trunnion and impacted in place.  The hip was then reduced.  Good stability and equalization of limb lengths was appreciated.  A total of 10 cc of antibiotic impregnated Stimulan beads (1 g of vancomycin and 240 mg of gentamicin) was then placed in the wound bed.  2 medium Hemovac drains were placed wound bed and brought through separate stab incision.  The IT band was reapproximated using interrupted sutures of #1 PDS.  Subcutaneous tissue was approximated layers using first #0 Monocryl followed by #2-0 Monocryl.  The skin was closed with skin staples.  A Prevena wound VAC was applied.  The patient tolerated the procedure well.  She was transported to the recovery room in stable condition.   Gina Matthews P. Angie Fava M.D.
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
## 2022-09-03 ENCOUNTER — Encounter: Payer: Self-pay | Admitting: Orthopedic Surgery

## 2022-09-03 ENCOUNTER — Other Ambulatory Visit: Payer: Self-pay

## 2022-09-03 DIAGNOSIS — T8452XA Infection and inflammatory reaction due to internal left hip prosthesis, initial encounter: Secondary | ICD-10-CM | POA: Diagnosis not present

## 2022-09-03 DIAGNOSIS — D62 Acute posthemorrhagic anemia: Secondary | ICD-10-CM

## 2022-09-03 DIAGNOSIS — E875 Hyperkalemia: Secondary | ICD-10-CM | POA: Diagnosis not present

## 2022-09-03 DIAGNOSIS — R579 Shock, unspecified: Secondary | ICD-10-CM

## 2022-09-03 LAB — COMPREHENSIVE METABOLIC PANEL
ALT: 12 U/L (ref 0–44)
AST: 19 U/L (ref 15–41)
Albumin: 2.9 g/dL — ABNORMAL LOW (ref 3.5–5.0)
Alkaline Phosphatase: 63 U/L (ref 38–126)
Anion gap: 8 (ref 5–15)
BUN: 14 mg/dL (ref 8–23)
CO2: 21 mmol/L — ABNORMAL LOW (ref 22–32)
Calcium: 8 mg/dL — ABNORMAL LOW (ref 8.9–10.3)
Chloride: 104 mmol/L (ref 98–111)
Creatinine, Ser: 0.56 mg/dL (ref 0.44–1.00)
GFR, Estimated: >60 mL/min (ref >60)
Glucose, Bld: 95 mg/dL (ref 70–99)
Potassium: 4.8 mmol/L (ref 3.5–5.1)
Sodium: 133 mmol/L — ABNORMAL LOW (ref 135–145)
Total Bilirubin: 0.8 mg/dL (ref 0.3–1.2)
Total Protein: 5.2 g/dL — ABNORMAL LOW (ref 6.5–8.1)

## 2022-09-03 LAB — BASIC METABOLIC PANEL
Anion gap: 5 (ref 5–15)
BUN: 16 mg/dL (ref 8–23)
CO2: 21 mmol/L — ABNORMAL LOW (ref 22–32)
Calcium: 8.3 mg/dL — ABNORMAL LOW (ref 8.9–10.3)
Chloride: 105 mmol/L (ref 98–111)
Creatinine, Ser: 0.6 mg/dL (ref 0.44–1.00)
GFR, Estimated: >60 mL/min (ref >60)
Glucose, Bld: 149 mg/dL — ABNORMAL HIGH (ref 70–99)
Potassium: 5.9 mmol/L — ABNORMAL HIGH (ref 3.5–5.1)
Sodium: 131 mmol/L — ABNORMAL LOW (ref 135–145)

## 2022-09-03 LAB — CBC WITH DIFFERENTIAL/PLATELET
Abs Immature Granulocytes: 0.03 10*3/uL (ref 0.00–0.07)
Abs Immature Granulocytes: 0.02 10*3/uL (ref 0.00–0.07)
Basophils Absolute: 0 10*3/uL (ref 0.0–0.1)
Basophils Absolute: 0 10*3/uL (ref 0.0–0.1)
Basophils Relative: 0 %
Basophils Relative: 1 %
Eosinophils Absolute: 0 10*3/uL (ref 0.0–0.5)
Eosinophils Absolute: 0.1 10*3/uL (ref 0.0–0.5)
Eosinophils Relative: 0 %
Eosinophils Relative: 1 %
HCT: 24.8 % — ABNORMAL LOW (ref 36.0–46.0)
HCT: 27.6 % — ABNORMAL LOW (ref 36.0–46.0)
Hemoglobin: 7.7 g/dL — ABNORMAL LOW (ref 12.0–15.0)
Hemoglobin: 8.9 g/dL — ABNORMAL LOW (ref 12.0–15.0)
Immature Granulocytes: 0 %
Immature Granulocytes: 0 %
Lymphocytes Relative: 18 %
Lymphocytes Relative: 26 %
Lymphs Abs: 1.3 10*3/uL (ref 0.7–4.0)
Lymphs Abs: 1.6 10*3/uL (ref 0.7–4.0)
MCH: 28.2 pg (ref 26.0–34.0)
MCH: 28.8 pg (ref 26.0–34.0)
MCHC: 31 g/dL (ref 30.0–36.0)
MCHC: 32.2 g/dL (ref 30.0–36.0)
MCV: 90.8 fL (ref 80.0–100.0)
MCV: 89.3 fL (ref 80.0–100.0)
Monocytes Absolute: 0.6 10*3/uL (ref 0.1–1.0)
Monocytes Absolute: 0.9 10*3/uL (ref 0.1–1.0)
Monocytes Relative: 8 %
Monocytes Relative: 15 %
Neutro Abs: 5.6 10*3/uL (ref 1.7–7.7)
Neutro Abs: 3.6 10*3/uL (ref 1.7–7.7)
Neutrophils Relative %: 74 %
Neutrophils Relative %: 57 %
Platelets: 235 10*3/uL (ref 150–400)
Platelets: 234 10*3/uL (ref 150–400)
RBC: 2.73 MIL/uL — ABNORMAL LOW (ref 3.87–5.11)
RBC: 3.09 MIL/uL — ABNORMAL LOW (ref 3.87–5.11)
RDW: 14.9 % (ref 11.5–15.5)
RDW: 15.3 % (ref 11.5–15.5)
WBC: 7.6 10*3/uL (ref 4.0–10.5)
WBC: 6.2 10*3/uL (ref 4.0–10.5)
nRBC: 0 % (ref 0.0–0.2)
nRBC: 0 % (ref 0.0–0.2)

## 2022-09-03 LAB — PROCALCITONIN: Procalcitonin: <0.1 ng/mL

## 2022-09-03 LAB — BLOOD GAS, VENOUS
Acid-base deficit: 4.8 mmol/L — ABNORMAL HIGH (ref 0.0–2.0)
Acid-base deficit: 0.8 mmol/L (ref 0.0–2.0)
Bicarbonate: 22.4 mmol/L (ref 20.0–28.0)
Bicarbonate: 24.9 mmol/L (ref 20.0–28.0)
O2 Saturation: 55.8 %
O2 Saturation: 88.4 %
Patient temperature: 37
Patient temperature: 37
pCO2, Ven: 51 mmHg (ref 44–60)
pCO2, Ven: 44 mmHg (ref 44–60)
pH, Ven: 7.25 (ref 7.25–7.43)
pH, Ven: 7.36 (ref 7.25–7.43)
pO2, Ven: 34 mmHg (ref 32–45)
pO2, Ven: 51 mmHg — ABNORMAL HIGH (ref 32–45)

## 2022-09-03 LAB — MRSA NEXT GEN BY PCR, NASAL: MRSA by PCR Next Gen: NOT DETECTED

## 2022-09-03 LAB — HEMOGLOBIN AND HEMATOCRIT, BLOOD
HCT: 23.9 % — ABNORMAL LOW (ref 36.0–46.0)
HCT: 28.3 % — ABNORMAL LOW (ref 36.0–46.0)
Hemoglobin: 7.8 g/dL — ABNORMAL LOW (ref 12.0–15.0)
Hemoglobin: 9.4 g/dL — ABNORMAL LOW (ref 12.0–15.0)

## 2022-09-03 LAB — PROTIME-INR
INR: 1.1 (ref 0.8–1.2)
Prothrombin Time: 14.6 seconds (ref 11.4–15.2)

## 2022-09-03 LAB — APTT: aPTT: 37 seconds — ABNORMAL HIGH (ref 24–36)

## 2022-09-03 LAB — BPAM RBC
Blood Product Expiration Date: 202406182359
Unit Type and Rh: 7300

## 2022-09-03 LAB — FIBRINOGEN: Fibrinogen: 385 mg/dL (ref 210–475)

## 2022-09-03 LAB — PHOSPHORUS: Phosphorus: 4 mg/dL (ref 2.5–4.6)

## 2022-09-03 LAB — TYPE AND SCREEN

## 2022-09-03 LAB — AEROBIC/ANAEROBIC CULTURE W GRAM STAIN (SURGICAL/DEEP WOUND): Gram Stain: NONE SEEN

## 2022-09-03 LAB — MAGNESIUM: Magnesium: 1.8 mg/dL (ref 1.7–2.4)

## 2022-09-03 LAB — LACTIC ACID, PLASMA: Lactic Acid, Venous: 0.9 mmol/L (ref 0.5–1.9)

## 2022-09-03 LAB — PREPARE RBC (CROSSMATCH)

## 2022-09-03 MED ORDER — SODIUM BICARBONATE 8.4 % IV SOLN
100.0000 meq | Freq: Once | INTRAVENOUS | Status: AC
  Administered 2022-09-03: 100 meq via INTRAVENOUS
  Filled 2022-09-03: qty 50

## 2022-09-03 MED ORDER — PHENYLEPHRINE HCL-NACL 20-0.9 MG/250ML-% IV SOLN
0.0000 ug/min | INTRAVENOUS | Status: DC
  Administered 2022-09-03: 20 ug/min via INTRAVENOUS
  Filled 2022-09-03: qty 250

## 2022-09-03 MED ORDER — PHENYLEPHRINE HCL-NACL 20-0.9 MG/250ML-% IV SOLN
25.0000 ug/min | INTRAVENOUS | Status: DC
  Filled 2022-09-03: qty 250

## 2022-09-03 MED ORDER — SODIUM ZIRCONIUM CYCLOSILICATE 5 G PO PACK
10.0000 g | PACK | Freq: Once | ORAL | Status: AC
  Administered 2022-09-03: 10 g via ORAL
  Filled 2022-09-03: qty 2

## 2022-09-03 MED ORDER — SODIUM CHLORIDE 0.9 % IV SOLN: 250.0000 mL | INTRAVENOUS | Status: DC

## 2022-09-03 MED ORDER — DEXTROSE 50 % IV SOLN
1.0000 | Freq: Once | INTRAVENOUS | Status: AC
  Administered 2022-09-03: 50 mL via INTRAVENOUS
  Filled 2022-09-03: qty 50

## 2022-09-03 MED ORDER — CHLORHEXIDINE GLUCONATE CLOTH 2 % EX PADS
6.0000 | MEDICATED_PAD | Freq: Every day | CUTANEOUS | Status: DC
  Administered 2022-09-03 – 2022-09-07 (×5): 6 via TOPICAL

## 2022-09-03 MED ORDER — MAGNESIUM SULFATE 2 GM/50ML IV SOLN
2.0000 g | Freq: Once | INTRAVENOUS | Status: AC
  Administered 2022-09-03: 2 g via INTRAVENOUS
  Filled 2022-09-03: qty 50

## 2022-09-03 MED ORDER — SODIUM CHLORIDE 0.9 % IV BOLUS: 250.0000 mL | Freq: Once | INTRAVENOUS | Status: DC

## 2022-09-03 MED ORDER — INSULIN ASPART 100 UNIT/ML IV SOLN
10.0000 [IU] | Freq: Once | INTRAVENOUS | Status: AC
  Administered 2022-09-03: 10 [IU] via INTRAVENOUS
  Filled 2022-09-03: qty 0.1

## 2022-09-03 MED ORDER — SODIUM CHLORIDE 0.9% FLUSH: 10.0000 mL | INTRAVENOUS | Status: DC | PRN

## 2022-09-03 MED ORDER — SODIUM CHLORIDE 0.9% IV SOLUTION: Freq: Once | INTRAVENOUS | Status: DC

## 2022-09-03 MED ORDER — SODIUM CHLORIDE 0.9 % IV SOLN
2.0000 g | Freq: Every day | INTRAVENOUS | Status: DC
  Administered 2022-09-04: 2 g via INTRAVENOUS
  Filled 2022-09-03: qty 20

## 2022-09-03 MED ORDER — SODIUM CHLORIDE 0.9% FLUSH
10.0000 mL | Freq: Two times a day (BID) | INTRAVENOUS | Status: DC
  Administered 2022-09-03 – 2022-09-04 (×4): 10 mL
  Administered 2022-09-05: 20 mL
  Administered 2022-09-05 – 2022-09-07 (×4): 10 mL

## 2022-09-03 NOTE — Anesthesia Postprocedure Evaluation (Signed)
Anesthesia Post Note  Patient: Madysin Crisp  Procedure(s) Performed: IRRIGATION AND DEBRIDEMENT HIP WITH POLY EXCHANGE (Left: Hip)  Patient location during evaluation: ICU Anesthesia Type: General Level of consciousness: awake and alert Pain management: pain level controlled Vital Signs Assessment: post-procedure vital signs reviewed and stable Respiratory status: spontaneous breathing, nonlabored ventilation, respiratory function stable and patient connected to nasal cannula oxygen Cardiovascular status: blood pressure returned to baseline and stable Postop Assessment: no apparent nausea or vomiting Anesthetic complications: no  No notable events documented.   Last Vitals:  Vitals:   09/03/22 0500 09/03/22 0515  BP: (!) 83/47 (!) 96/54  Pulse:    Resp: 11 12  Temp:    SpO2:      Last Pain:  Vitals:   09/03/22 0642  TempSrc:   PainSc: Asleep                 Starling Manns

## 2022-09-03 NOTE — Plan of Care (Signed)
  Problem: Education: Goal: Knowledge of the prescribed therapeutic regimen will improve Outcome: Not Progressing Goal: Understanding of discharge needs will improve Outcome: Not Progressing Goal: Individualized Educational Video(s) Outcome: Not Progressing   Problem: Activity: Goal: Ability to avoid complications of mobility impairment will improve Outcome: Not Progressing Goal: Ability to tolerate increased activity will improve Outcome: Not Progressing   Problem: Clinical Measurements: Goal: Postoperative complications will be avoided or minimized Outcome: Not Progressing   Problem: Pain Management: Goal: Pain level will decrease with appropriate interventions Outcome: Not Progressing   Problem: Skin Integrity: Goal: Will show signs of wound healing Outcome: Not Progressing   Problem: Education: Goal: Knowledge of General Education information will improve Description: Including pain rating scale, medication(s)/side effects and non-pharmacologic comfort measures Outcome: Not Progressing   Problem: Health Behavior/Discharge Planning: Goal: Ability to manage health-related needs will improve Outcome: Not Progressing   Problem: Clinical Measurements: Goal: Ability to maintain clinical measurements within normal limits will improve Outcome: Not Progressing Goal: Will remain free from infection Outcome: Not Progressing Goal: Diagnostic test results will improve Outcome: Not Progressing Goal: Respiratory complications will improve Outcome: Not Progressing Goal: Cardiovascular complication will be avoided Outcome: Not Progressing   Problem: Activity: Goal: Risk for activity intolerance will decrease Outcome: Not Progressing   Problem: Nutrition: Goal: Adequate nutrition will be maintained Outcome: Not Progressing   Problem: Coping: Goal: Level of anxiety will decrease Outcome: Not Progressing   Problem: Elimination: Goal: Will not experience complications  related to bowel motility Outcome: Not Progressing Goal: Will not experience complications related to urinary retention Outcome: Not Progressing   Problem: Pain Managment: Goal: General experience of comfort will improve Outcome: Not Progressing   Problem: Safety: Goal: Ability to remain free from injury will improve Outcome: Not Progressing   Problem: Skin Integrity: Goal: Risk for impaired skin integrity will decrease Outcome: Not Progressing Post hip debridement from infection hemovac and wound vac

## 2022-09-03 NOTE — Evaluation (Signed)
Occupational Therapy Evaluation Patient Details Name: Gina Matthews MRN: [id number] DOB: 20-May-1950 Today's Date: 09/03/2022   History of Present Illness 72 y/o female s/p L hip I&D with poly and femoral head exchange on 09/02/22. Admitted after procedure for hypotension 2/2 acute blood loss anemia. PMH: L THA on 04/15/22, HTN, fibromyalgia   Clinical Impression   Gina Matthews was seen for OT evaluation this date. Prior to hospital admission, pt was IND. Pt lives alone. Pt currently requires CGA for BSC t/f, MOD I pericare sitting. MOD A for LB access, assist to maintain posterior hip pcns, good recall. Pt would benefit from skilled OT to address noted impairments and functional limitations (see below for any additional details). Upon hospital discharge, recommend no OT follow up.   Recommendations for follow up therapy are one component of a multi-disciplinary discharge planning process, led by the attending physician.  Recommendations may be updated based on patient status, additional functional criteria and insurance authorization.   Assistance Recommended at Discharge Intermittent Supervision/Assistance  Patient can return home with the following A little help with bathing/dressing/bathroom    Functional Status Assessment  Patient has had a recent decline in their functional status and demonstrates the ability to make significant improvements in function in a reasonable and predictable amount of time.  Equipment Recommendations  None recommended by OT    Recommendations for Other Services       Precautions / Restrictions Precautions Precautions: Posterior Hip;Fall Restrictions Weight Bearing Restrictions: Yes RLE Weight Bearing: Weight bearing as tolerated      Mobility Bed Mobility Overal bed mobility: Modified Independent                  Transfers Overall transfer level: Needs assistance Equipment used: None Transfers: Sit to/from Stand, Bed to  chair/wheelchair/BSC Sit to Stand: Min guard Stand pivot transfers: Min guard                Balance Overall balance assessment: Needs assistance Sitting-balance support: No upper extremity supported, Feet supported Sitting balance-Leahy Scale: Normal     Standing balance support: No upper extremity supported, During functional activity Standing balance-Leahy Scale: Fair                             ADL either performed or assessed with clinical judgement   ADL Overall ADL's : Needs assistance/impaired                                       General ADL Comments: CGA for BSC t/f, MOD I pericare sitting. MOD A for LB access, assist to maintain posterior hip pcns.      Pertinent Vitals/Pain Pain Assessment Pain Assessment: 0-10 Pain Score: 4  Pain Location: R hip Pain Descriptors / Indicators: Discomfort, Grimacing Pain Intervention(s): Limited activity within patient's tolerance, Premedicated before session     Hand Dominance Right   Extremity/Trunk Assessment Upper Extremity Assessment Upper Extremity Assessment: Overall WFL for tasks assessed   Lower Extremity Assessment Lower Extremity Assessment: Generalized weakness       Communication Communication Communication: No difficulties   Cognition Arousal/Alertness: Awake/alert Behavior During Therapy: WFL for tasks assessed/performed Overall Cognitive Status: Within Functional Limits for tasks assessed  Home Living Family/patient expects to be discharged to:: Private residence Living Arrangements: Alone Available Help at Discharge: Friend(s);Available PRN/intermittently (daughter may stay a few days if able) Type of Home: House Home Access: Stairs to enter [employer] of Steps: 4 Entrance Stairs-Rails: Right Home Layout: One level     Bathroom Shower/Tub: Walk-in shower         Home  Equipment: Agricultural consultant (2 wheels);Cane - single point;Shower seat;Grab bars - tub/shower;BSC/3in1   Additional Comments: retired Psychologist, prison and probation services Prior Level of Function : Independent/Modified Independent             Mobility Comments: using rolling walker for ambulation ADLs Comments: IND in B/IADLs        OT Problem List: Decreased strength;Decreased safety awareness      OT Treatment/Interventions: Self-care/ADL training;Therapeutic exercise;DME and/or AE instruction;Energy conservation    OT Goals(Current goals can be found in the care plan section) Acute Rehab OT Goals Patient Stated Goal: to go home OT Goal Formulation: With patient Time For Goal Achievement: 09/17/22 Potential to Achieve Goals: Good ADL Goals Pt Will Perform Grooming: Independently;standing Pt Will Perform Lower Body Dressing: with modified independence;sit to/from stand Pt Will Transfer to Toilet: with modified independence;ambulating;regular height toilet  OT Frequency: Min 1X/week    Co-evaluation              AM-PAC OT "6 Clicks" Daily Activity     Outcome Measure Help from another person eating meals?: None Help from another person taking care of personal grooming?: None Help from another person toileting, which includes using toliet, bedpan, or urinal?: A Little Help from another person bathing (including washing, rinsing, drying)?: A Lot Help from another person to put on and taking off regular upper body clothing?: None Help from another person to put on and taking off regular lower body clothing?: A Little 6 Click Score: 20   End of Session Nurse Communication: Mobility status  Activity Tolerance: Patient tolerated treatment well Patient left: in bed;with call bell/phone within reach  OT Visit Diagnosis: Other abnormalities of gait and mobility (R26.89)                Time: 8657-8469 OT Time Calculation (min): 15 min Charges:  OT General  Charges $OT Visit: 1 Visit OT Evaluation $OT Eval Low Complexity: 1 Low  Kathie Dike, M.S. OTR/L  09/03/22, 12:30 PM  ascom [phone]

## 2022-09-03 NOTE — Progress Notes (Signed)
Date of Admission:  09/02/2022      ID: Gina Matthews is a 72 y.o. female Principal Problem:   Prosthetic joint infection of left hip (HCC) Active Problems:   Hyperkalemia   Acute blood loss as cause of postoperative anemia   Shock circulatory (HCC)    Subjective: Doing better today Sat in chair Took a few steps with walker Had bowel movt  Medications:   sodium chloride   Intravenous Once   ascorbic acid  1,000 mg Oral Daily   aspirin  81 mg Oral BID   Chlorhexidine Gluconate Cloth  6 each Topical Daily   DULoxetine  60 mg Oral Daily   feeding supplement  237 mL Oral BID BM   ferrous sulfate  325 mg Oral BID WC   gabapentin  1,200 mg Oral BID   And   gabapentin  600 mg Oral Q1500   loratadine  10 mg Oral Daily   magnesium hydroxide  30 mL Oral Daily   metoCLOPramide  10 mg Oral TID AC & HS   multivitamin with minerals  1 tablet Oral Daily   pantoprazole  40 mg Oral BID   senna-docusate  1 tablet Oral BID   sodium chloride flush  10-40 mL Intracatheter Q12H   traZODone  50 mg Oral QHS    Objective: Vital signs in last 24 hours: Patient Vitals for the past 24 hrs:  BP Temp Temp src Pulse Resp SpO2 Height Weight  09/03/22 1741 -- 98 F (36.7 C) Axillary 80 -- -- -- --  09/03/22 1723 (!) 109/54 98 F (36.7 C) Axillary 85 (!) 27 100 % -- --  09/03/22 1600 (!) 104/58 -- -- 80 15 100 % -- --  09/03/22 1500 (!) 102/49 -- -- 75 15 98 % -- --  09/03/22 1400 (!) 88/45 -- -- 82 15 95 % -- --  09/03/22 1318 -- -- -- 84 14 96 % -- --  09/03/22 1300 (!) 97/49 -- -- 88 18 97 % -- --  09/03/22 1200 (!) 101/55 -- -- 85 18 100 % -- --  09/03/22 1100 -- -- -- 85 (!) 26 97 % -- --  09/03/22 1000 (!) 99/53 -- -- 86 17 98 % -- --  09/03/22 0909 -- -- -- -- 17 -- -- --  09/03/22 0900 (!) 105/91 -- -- (!) 28 20 90 % -- --  09/03/22 0800 110/60 -- -- 79 19 99 % -- --  09/03/22 0700 (!) 85/45 -- -- 73 11 100 % -- --  09/03/22 0642 -- -- -- 67 11 98 % -- --  09/03/22 0515 (!)  96/54 -- -- -- 12 -- -- --  09/03/22 0500 (!) 83/47 -- -- -- 11 -- -- --  09/03/22 0445 (!) 88/49 97.6 F (36.4 C) Axillary -- 11 -- -- --  09/03/22 0430 (!) 83/53 -- -- -- 12 -- -- --  09/03/22 0415 (!) 84/52 -- -- -- 10 -- -- --  09/03/22 0400 (!) 101/57 -- -- -- (!) 9 -- -- --  09/03/22 0345 103/62 -- -- -- 12 -- -- --  09/03/22 0330 (!) 86/50 -- -- -- (!) 9 96 % -- --  09/03/22 0315 (!) 97/54 -- -- -- 10 -- -- --  09/03/22 0303 (!) 82/51 98.3 F (36.8 C) Axillary 70 (!) 9 -- -- --  09/03/22 0300 (!) 95/51 -- -- -- (!) 9 -- -- --  09/03/22 0248 (!) 90/56 97.7 F (36.5 C)  Oral 68 (!) 9 98 % -- --  09/03/22 0230 (!) 84/52 -- -- -- (!) 7 -- -- --  09/03/22 0207 101/61 97.8 F (36.6 C) Oral -- -- -- -- --  09/03/22 0200 (!) 97/52 -- -- -- (!) 8 -- -- --  09/03/22 0145 (!) 94/56 -- -- -- 10 -- -- --  09/03/22 0130 (!) 94/50 -- -- -- 10 -- -- --  09/03/22 0115 (!) 98/58 -- -- -- (!) 9 -- -- --  09/03/22 0100 (!) 102/58 -- -- 78 (!) 8 97 % -- --  09/03/22 0045 100/61 -- -- 79 (!) 9 99 % -- --  09/03/22 0030 (!) 98/59 -- -- 82 11 98 % -- --  09/03/22 0015 98/60 -- -- 85 16 96 % -- --  09/03/22 0000 (!) 112/56 -- -- 88 12 100 % -- --  09/02/22 2351 104/69 98.1 F (36.7 C) Oral -- (!) 27 98 % 5' 3.5" (1.613 m) 67.5 kg  09/02/22 2337 (!) 104/59 (!) 97.4 F (36.3 C) Temporal 87 11 100 % -- --  09/02/22 2332 -- -- -- -- 13 -- -- --  09/02/22 2330 (!) 122/54 -- -- -- 15 -- -- --  09/02/22 2322 (!) 115/57 -- -- -- 11 -- -- --  09/02/22 2315 103/60 (!) 97.4 F (36.3 C) Temporal 84 11 -- -- --  09/02/22 2300 101/65 -- -- -- 15 -- -- --  09/02/22 2245 102/64 -- -- 78 11 99 % -- --  09/02/22 2241 118/65 -- -- -- 18 -- -- --  09/02/22 2230 104/62 -- -- 81 16 99 % -- --  09/02/22 2220 98/60 -- -- 81 13 98 % -- --  09/02/22 2215 (!) 95/54 -- -- -- 14 -- -- --  09/02/22 2214 (!) 95/54 -- -- -- 19 -- -- --  09/02/22 2200 122/62 -- -- -- 13 -- -- --  09/02/22 2155 104/63 -- -- -- 18 95 % -- --   09/02/22 2150 105/62 -- -- -- 13 -- -- --  09/02/22 2145 (!) 111/59 -- -- -- 18 -- -- --  09/02/22 2140 (!) 103/59 -- -- 89 19 98 % -- --  09/02/22 2135 100/62 -- -- 85 13 91 % -- --  09/02/22 2130 (!) 87/53 -- -- 88 13 96 % -- --  09/02/22 2127 (!) 82/53 -- -- 90 14 100 % -- --  09/02/22 2123 (!) 186/172 (!) 97.5 F (36.4 C) -- 91 (!) 22 100 % -- --      PHYSICAL EXAM:  General: Alert, cooperative, no distress, appears stated age.  Head: Normocephalic, without obvious abnormality, atraumatic. Eyes: Conjunctivae clear, anicteric sclerae. Pupils are equal ENT Nares normal. No drainage or sinus tenderness. Lips, mucosa, and tongue normal. No Thrush Neck: Supple, symmetrical, no adenopathy, thyroid: non tender no carotid bruit and no JVD. Back: No CVA tenderness. Lungs: Clear to auscultation bilaterally. No Wheezing or Rhonchi. No rales. Heart: Regular rate and rhythm, no murmur, rub or gallop. Abdomen: Soft, non-tender,not distended. Bowel sounds normal. No masses Extremities: left hip- vac covering surgical site Skin: No rashes or lesions. Or bruising Lymph: Cervical, supraclavicular normal. Neurologic: Grossly non-focal  Lab Results    Latest Ref Rng & Units 09/03/2022    2:24 PM 09/03/2022    8:43 AM 09/03/2022    2:14 AM  CBC  WBC 4.0 - 10.5 K/uL  6.2  7.6   Hemoglobin 12.0 - 15.0 g/dL 7.8  8.9  7.7   Hematocrit 36.0 - 46.0 % 23.9  27.6  24.8   Platelets 150 - 400 K/uL  234  235        Latest Ref Rng & Units 09/03/2022    8:43 AM 09/03/2022    2:14 AM 09/02/2022    2:54 PM  CMP  Glucose 70 - 99 mg/dL 95  960  73   BUN 8 - 23 mg/dL 14  16  20    Creatinine 0.44 - 1.00 mg/dL 4.54  0.98  1.19   Sodium 135 - 145 mmol/L 133  131  131   Potassium 3.5 - 5.1 mmol/L 4.8  5.9  5.1   Chloride 98 - 111 mmol/L 104  105  101   CO2 22 - 32 mmol/L 21  21  19    Calcium 8.9 - 10.3 mg/dL 8.0  8.3  8.6   Total Protein 6.5 - 8.1 g/dL 5.2   7.1   Total Bilirubin 0.3 - 1.2 mg/dL 0.8    0.5   Alkaline Phos 38 - 126 U/L 63   101   AST 15 - 41 U/L 19   21   ALT 0 - 44 U/L 12   18       Microbiology: BC- NG so far 3 Wound /tissue culture P Studies/Results: DG Hip Port Unilat With Pelvis 1V Left  Result Date: 09/02/2022 CLINICAL DATA:  Status post left hip I and D EXAM: DG HIP (WITH OR WITHOUT PELVIS) 1V PORT LEFT COMPARISON:  04/15/2022 FINDINGS: Left hip replacement is noted. Surgical drains are noted in place. Multiple antibiotic beads are seen. No acute bony abnormality is noted. IMPRESSION: Postsurgical changes in the left hip. Electronically Signed   By: Alcide Clever M.D.   On: 09/02/2022 22:26   [place] EKG SITE RITE  Result Date: 09/02/2022 If Site Rite image not attached, placement could not be confirmed due to current cardiac rhythm.    Assessment/Plan: Left hip PJI- underwent DAIR Cultures pending Had MRSA in a superficial culture in March 2024 Has been on bactrim prior to admission Currently on daptomycin Will add ceftriaxone Will need 6 weeks IV and followed by PO  Acute blood anemia post surgery with hypotension Received PRBC x 3 - was on pressor for a short period  Pt has PICC line OPAT orders will be placed   Discussed the management with the patient in detail

## 2022-09-03 NOTE — Evaluation (Signed)
Physical Therapy Evaluation Patient Details Name: Gina Matthews MRN: [id number] DOB: 1950-11-27 Today's Date: 09/03/2022  History of Present Illness  72 y/o female s/p L hip I&D with poly and femoral head exchange on 09/02/22. Admitted after procedure for hypotension 2/2 acute blood loss anemia. PMH: L THA on 04/15/22, HTN, fibromyalgia  Clinical Impression  Patient admitted with the above. PTA, patient lives alone and reports independence with rollator. Patient presents with weakness, impaired balance, decreased activity tolerance, and impaired functional mobility. Patient required min guard for transfers and short ambulation distance in the room with personal rollator. Educated patient on posterior hip precautions, patient verbalized understanding but requires frequent cueing to maintain. Patient will benefit from skilled PT services during acute stay to address listed deficits. Patient will benefit from ongoing therapy at discharge to maximize functional independence and safety.        Recommendations for follow up therapy are one component of a multi-disciplinary discharge planning process, led by the attending physician.  Recommendations may be updated based on patient status, additional functional criteria and insurance authorization.  Follow Up Recommendations       Assistance Recommended at Discharge Intermittent Supervision/Assistance  Patient can return home with the following  A little help with walking and/or transfers;A little help with bathing/dressing/bathroom;Assistance with cooking/housework;Assist for transportation;Help with stairs or ramp for entrance    Equipment Recommendations None recommended by PT  Recommendations for Other Services       Functional Status Assessment Patient has had a recent decline in their functional status and demonstrates the ability to make significant improvements in function in a reasonable and predictable amount of time.     Precautions /  Restrictions Precautions Precautions: Posterior Hip;Fall Precaution Comments: wound vac Restrictions Weight Bearing Restrictions: Yes RLE Weight Bearing: Weight bearing as tolerated      Mobility  Bed Mobility Overal bed mobility: Modified Independent                  Transfers Overall transfer level: Needs assistance Equipment used: None Transfers: Sit to/from Stand, Bed to chair/wheelchair/BSC Sit to Stand: Min guard Stand pivot transfers: Min guard              Ambulation/Gait Ambulation/Gait assistance: Min guard Gait Distance (Feet): 12 Feet Assistive device: Rollator (4 wheels) Gait Pattern/deviations: Step-through pattern, Decreased stride length, Decreased stance time - left Gait velocity: decreased     General Gait Details: min guard to ambulate around room with rollator  Stairs            Wheelchair Mobility    Modified Rankin (Stroke Patients Only)       Balance Overall balance assessment: Needs assistance Sitting-balance support: No upper extremity supported, Feet supported Sitting balance-Leahy Scale: Normal     Standing balance support: No upper extremity supported, During functional activity Standing balance-Leahy Scale: Fair                               Pertinent Vitals/Pain Pain Assessment Pain Assessment: 0-10 Pain Score: 4  Pain Location: R hip Pain Descriptors / Indicators: Discomfort, Grimacing Pain Intervention(s): Limited activity within patient's tolerance, Monitored during session    Home Living Family/patient expects to be discharged to:: Private residence Living Arrangements: Alone Available Help at Discharge: Friend(s);Available PRN/intermittently (daughter may stay a few days if able) Type of Home: House Home Access: Stairs to enter Entrance Stairs-Rails: Right Entrance Stairs-Number of Steps: 4   Home  Layout: One level Home Equipment: Agricultural consultant (2 wheels);Cane - single point;Shower  seat;Grab bars - tub/shower;BSC/3in1 Additional Comments: retired Engineering geologist Prior Level of Function : Independent/Modified Independent             Mobility Comments: using rolling Shalin Linders for ambulation ADLs Comments: IND in B/IADLs     Hand Dominance   Dominant Hand: Right    Extremity/Trunk Assessment   Upper Extremity Assessment Upper Extremity Assessment: Defer to OT evaluation    Lower Extremity Assessment Lower Extremity Assessment: Generalized weakness       Communication   Communication: No difficulties  Cognition Arousal/Alertness: Awake/alert Behavior During Therapy: WFL for tasks assessed/performed Overall Cognitive Status: Within Functional Limits for tasks assessed                                          General Comments      Exercises     Assessment/Plan    PT Assessment Patient needs continued PT services  PT Problem List Decreased strength;Decreased activity tolerance;Decreased range of motion;Decreased balance;Decreased mobility;Decreased knowledge of use of DME;Decreased safety awareness;Decreased knowledge of precautions       PT Treatment Interventions Gait training;DME instruction;Functional mobility training;Therapeutic activities;Therapeutic exercise;Balance training;Stair training;Patient/family education    PT Goals (Current goals can be found in the Care Plan section)  Acute Rehab PT Goals Patient Stated Goal: to go home PT Goal Formulation: With patient Time For Goal Achievement: 09/17/22 Potential to Achieve Goals: Good    Frequency 7X/week     Co-evaluation               AM-PAC PT "6 Clicks" Mobility  Outcome Measure Help needed turning from your back to your side while in a flat bed without using bedrails?: None Help needed moving from lying on your back to sitting on the side of a flat bed without using bedrails?: None Help needed moving to and from a bed to a chair (including a  wheelchair)?: A Little Help needed standing up from a chair using your arms (e.g., wheelchair or bedside chair)?: A Little Help needed to walk in hospital room?: A Little Help needed climbing 3-5 steps with a railing? : A Little 6 Click Score: 20    End of Session   Activity Tolerance: Patient tolerated treatment well Patient left: in chair;with call bell/phone within reach Nurse Communication: Mobility status PT Visit Diagnosis: Unsteadiness on feet (R26.81);Muscle weakness (generalized) (M62.81);Other abnormalities of gait and mobility (R26.89)    Time: 1610-9604 PT Time Calculation (min) (ACUTE ONLY): 19 min   Charges:   PT Evaluation $PT Eval Moderate Complexity: 1 Mod          Maylon Peppers, PT, DPT Physical Therapist - [place]  [facility]   Emileigh Kellett A Daishaun Ayre 09/03/2022, 4:23 PM

## 2022-09-03 NOTE — Progress Notes (Signed)
ORTHOPAEDICS PROGRESS NOTE  PATIENT NAME: Gina Matthews DOB: 06-14-1950  MRN: [id number]  POD # 1: Left hip I&D, polyethylene & femoral head exchange, and placement of antibiotic-impregnated Stimulan beads  Subjective: Patient awake and alert. Pain is under better control. She denies any nausea, shortness of breath, or chest pain.  Objective: Vital signs in last 24 hours: Temp:  [97.3 F (36.3 C)-98.3 F (36.8 C)] 97.6 F (36.4 C) (06/13 0445) Pulse Rate:  [67-91] 79 (06/13 0800) Resp:  [7-27] 19 (06/13 0800) BP: (82-186)/(45-172) 110/60 (06/13 0800) SpO2:  [91 %-100 %] 99 % (06/13 0800) Weight:  [65.3 kg-67.5 kg] 67.5 kg (06/12 2351)  Intake/Output from previous day: 06/12 0701 - 06/13 0700 In: 4882.1 [I.V.:3391.2; Blood:800.8; IV Piggyback:690] Out: 1430 [Urine:985; Drains:45; Blood:400]  Recent Labs    09/02/22 1454 09/02/22 2137 09/03/22 0214  WBC 5.7 6.9 7.6  HGB 10.0* 6.6* 7.7*  HCT 32.6* 22.1* 24.8*  PLT 401* 281 235  K 5.1  --  5.9*  CL 101  --  105  CO2 19*  --  21*  BUN 20  --  16  CREATININE 0.83  --  0.60  GLUCOSE 73  --  149*  CALCIUM 8.6*  --  8.3*    EXAM General: Well developed, well nourished female in no appaent discomfort.  Lungs: clear to auscultation Cardiac: normal rate and regular rhythm. No gallops or rubs. 2/6 murmur. Left lower extremity: WoundVAC and hemovac in place. Left thigh is soft with no erythema. Homan test is negative. Neurologic: Sensory and motor function are grossly intact.   Assessment: Left hip periprosthetic hip infection (cultures pending)  Plan: I discussed the case with the intensivist. Patient is still on pressors to maintain blood pressure.Labs have been reordered (labs had been canceled by the Lab!) PICC line to be placed today. Daptomycin to be started as per Dr. Rivka Safer.  I contacted the patient's daughter and provided an update.  Ramar Nobrega P. Angie Fava M.D.

## 2022-09-03 NOTE — Progress Notes (Signed)
0040 patient alert x4 comes from PACU with wound vac and hemavac to left side patient on pressors IV consult in IV team came to bedside pressors on pause at this time IV team will not place IV at this time said to consult again if pressors restated. Patient complaining left AC IV hurt IV team aware said not infiltrated so IV must stay in place. IV flushed ok no blood return currently has blood transfusing in it. Pur wick placed on patient wound vac plugged into wall

## 2022-09-03 NOTE — TOC Initial Note (Signed)
Transition of Care [facility]) - Initial/Assessment Note    Patient Details  Name: Gina Matthews MRN: [id number] Date of Birth: [birth year]  Transition of Care [facility]) CM/SW Contact:    Darolyn Rua, LCSW Phone Number: 09/03/2022, 10:07 AM  Clinical Narrative:                  Jeri Modena with [facility] home infusion called this CSW to reports referral made by ID MD, reports patient will be on 6 weeks of Iv antibiotic when medically cleared to discharge.   They will continue to follow for when patient is closer to medically stable to dc.    Expected Discharge Plan: Home w Home Health Services Barriers to Discharge: Continued Medical Work up   Patient Goals and CMS Choice            Expected Discharge Plan and Services                                              Prior Living Arrangements/Services                       Activities of Daily Living Home Assistive Devices/Equipment: Environmental consultant (specify type) ADL Screening (condition at time of admission) Patient's cognitive ability adequate to safely complete daily activities?: No Is the patient deaf or have difficulty hearing?: No Does the patient have difficulty seeing, even when wearing glasses/contacts?: No Does the patient have difficulty concentrating, remembering, or making decisions?: No Patient able to express need for assistance with ADLs?: Yes Does the patient have difficulty dressing or bathing?: No Independently performs ADLs?: Yes (appropriate for developmental age) Does the patient have difficulty walking or climbing stairs?: Yes Weakness of Legs: Right Weakness of Arms/Hands: None  Permission Sought/Granted                  Emotional Assessment              Admission diagnosis:  Prosthetic joint infection of left hip (HCC) [T84.52XA] Patient Active Problem List   Diagnosis Date Noted   Hyperkalemia 09/03/2022   Acute blood loss as cause of postoperative anemia 09/03/2022    Shock circulatory (HCC) 09/03/2022   Prosthetic joint infection of left hip (HCC) 09/02/2022   Overweight (BMI 25.0-29.9) 05/11/2022   Hypokalemia 05/11/2022   Hypotension 05/11/2022   Hematoma of left hip 05/10/2022   Hx of total hip arthroplasty, left 04/15/2022   Primary osteoarthritis of left hip 02/01/2022   History of shingles 02/21/2021   Status post total right knee replacement 01/27/2021   Gastroesophageal reflux disease with hiatal hernia 10/03/2020   Tachycardia 06/28/2020   Major depressive disorder, recurrent, moderate (HCC) 11/23/2019   Age-related osteoporosis without current pathological fracture 07/17/2019   Abnormal electrocardiogram (ECG) (EKG) 10/01/2018   Dental injury 10/01/2018   Maxillary fracture (HCC) 10/01/2018   Syncope 10/01/2018   Kyphosis of thoracic region 07/30/2017   Chronic midline low back pain without sciatica 07/30/2017   Hyponatremia 11/20/2016   Chronic pain syndrome 12/23/2014   Fibromyalgia muscle pain 12/23/2014   Allergic rhinitis 12/15/2014   Arthritis involving multiple sites 12/15/2014   Chronic insomnia 12/15/2014   Arthritis of both knees 07/31/2014   Seasonal allergies 07/31/2014   Hypertension, essential, benign 06/08/2014   Alcohol dependence in early, early partial, sustained full, or sustained partial remission (HCC) 01/03/2014  History of alcohol dependence (HCC) 01/03/2014   PCP:  Orene Desanctis, MD Pharmacy:   [facility] [address] ([address]), [place] - [address] [place]) [place] [address] Phone: [phone] Fax: [phone]     Social Determinants of Health (SDOH) Social History: SDOH Screenings   Food Insecurity: No Food Insecurity (09/03/2022)  Housing: Low Risk  (09/03/2022)  Transportation Needs: No Transportation Needs (09/03/2022)  Utilities: Not At Risk (09/03/2022)  Tobacco Use: Low Risk  (09/03/2022)   SDOH Interventions:     Readmission Risk  Interventions     No data to display

## 2022-09-03 NOTE — Consult Note (Signed)
NAME:  Gina Matthews, MRN:  [id number], DOB:  May 09, 1950, LOS: 1 ADMISSION DATE:  09/02/2022, CONSULTATION DATE: 09/03/2022 REFERRING MD: Dr. Ernest Pine, CHIEF COMPLAINT: Hypotension    History of Present Illness:  This is a 72 yo female who previously underwent a left total hip arthroplasty on 04/15/22.  Her postoperative course was complicated by a large hematoma of the left hip requiring surgical evacuation on 05/11/22 along with Prevana wound vac placement.  Pt subsequently discharge to a skilled nursing facility for rehab.  During outpatient ortho follow-up visit on 06/11/22 pt noted to have persistent left hip drainage with erythema for which she was prescribed bactrim with resolution of the erythema.  Superficial cultures were positive for MRSA wound care consulted and wound vac placed.  On 06/7 during ortho follow-up visit pt c/o tightness/discomfort at the left hip incision site.  A joint aspiration was performed for culture and pt instructed to continue bactrim.  Aspiration grain stain results demonstrated no organisms but many WBC's.  On 06/11 ortho recommended incision, irrigation and debridement of the left hip with possible need for deep exploration and placement of antibiotic-impregnated stimulation beads.    Pt presented to [facility] on 06/12 for left hip surgical procedure.  Pt underwent incision, irrigation, and debridement of the left hip, polyethylene and femoral head exchange, and placement of antibiotic impregnated stimulation beads.  Postop pt hypotensive secondary to acute blood loss anemia and required blood products and neo-synephrine gtt.  Pt also hyperkalemia.  She was admitted to the stepdown unit per orthopedic surgery.  Due to persistent hypotension requiring neo-synephrine gtt PCCM team consulted on 06/13 to assist with management.  Pertinent  Medical History  Iron Deficiency Anemia  Arthritis  Depression  Fibromyalgia  GERD  Hiatal Hernia HTN Scoliosis  Tachycardia    Significant Hospital Events: Including procedures, antibiotic start and stop dates in addition to other pertinent events   06/12: Pt admitted with left hip surgical site infection s/p left total hip arthroplasty on 04/15/22 requiring incision, irrigation, and debridement of the left hip, polyethylene and femoral head exchange, and placement of antibiotic impregnated stimulation beads.  Postop pt hypotensive secondary to acute blood loss anemia requiring blood products and neo-synephrine gtt 06/13: Due to continued hypotension requiring neo-synephrine gtt PCCM team consulted to assist with management  Micro Data:   06/12: Aerobic/Anaerobic Culture Left Hip Tissue>>negative  06/12: MRSA PCR>> negative  06/12: Blood x2>> 06/13: Aerobic/Anaerobic Culture Left Hip Tissue>>negative   Anti-infectives (From admission, onward)    Start     Dose/Rate Route Frequency Ordered Stop   09/03/22 1400  DAPTOmycin (CUBICIN) 500 mg in sodium chloride 0.9 % IVPB        8 mg/kg  65.3 kg 120 mL/hr over 30 Minutes Intravenous Daily 09/02/22 2343     09/02/22 1800  gentamicin (GARAMYCIN) injection  Status:  Discontinued          As needed 09/02/22 1800 09/02/22 2120   09/02/22 1759  vancomycin (VANCOCIN) powder  Status:  Discontinued          As needed 09/02/22 1759 09/02/22 2120   09/02/22 1430  ceFAZolin (ANCEF) IVPB 2g/100 mL premix        2 g 200 mL/hr over 30 Minutes Intravenous On call to O.R. 09/02/22 1413 09/02/22 1642   09/02/22 1415  vancomycin (VANCOCIN) IVPB 1000 mg/200 mL premix        1,000 mg 200 mL/hr over 60 Minutes Intravenous  Once 09/02/22 1413 09/03/22 0005  Interim History / Subjective:  Pt complains of 6/10 left hip pain although she states this has improved.  Currently requiring neo-synephrine gtt @20  mcg/min to maintain map >65  Objective   Blood pressure (!) 96/54, pulse 70, temperature 97.6 F (36.4 C), temperature source Axillary, resp. rate 12, height 5' 3.5" (1.613  m), weight 67.5 kg, SpO2 96 %.        Intake/Output Summary (Last 24 hours) at 09/03/2022 0740 Last data filed at 09/03/2022 1610 Gross per 24 hour  Intake 4882.07 ml  Output 1430 ml  Net 3452.07 ml   Filed Weights   09/02/22 1417 09/02/22 2351  Weight: 65.3 kg 67.5 kg    Examination: General: Acutely-ill appearing female, NAD on RA  HENT: Supple, no JVD  Lungs: Clear throughout, even, non labored  Cardiovascular: NSR, s1s2, no m/r/g, 2+ radial/1+ distal pulses, 1+ left hip edema  Abdomen: Hypoactive BS x4, soft, non tender, non distended  Extremities: Moves all extremities  Skin: Left hip wound vac and hemovac present wnl  Neuro: Alert and oriented, following commands, PERRLA GU: Required in and out cath due to urinary retention   Resolved Hospital Problem list     Assessment & Plan:  #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 s/p I&D, polyethylene and femoral head exchange, and placement of antibiotic impregnated stimulation beads~06/12 - Trend WBC and monitor fever curve - Trend PCT - Follow cultures  - ID consulted appreciate input~abx therapy as outlined above and will need 6 weeks of iv abx pt pending PICC line placement  - Orthopedic surgery primary  - PT/OT once hypotension resolved   #Hypotension secondary to acute blood loss anemia  - Continuous telemetry monitoring  - Blood products and neo-synephrine gtt prn to maintain map >65 - Hold outpatient antihypertensives for now   #Hyperkalemia  - Trend BMP  - Replace electrolytes as indicated   #Postop acute blood loss anemia  Hx: Iron deficiency anemia  - VTE px: SCD's, avoid chemical px  - Will hold outpatient celebrex for now  - Trend CBC and coags  - Monitor for s/sx of bleeding  - Transfuse for hgb <8 and/or active signs of bleeding  - Monitor hemovac and wound vac output   #GERD  - Continue PPI   #Postop pain  - Continue scheduled acetaminophen and prn  narcotics for pain management  - Continue bowel regimen    Best Practice (right click and "Reselect all SmartList Selections" daily)   Diet/type: Regular consistency (see orders) DVT prophylaxis: SCD GI prophylaxis: PPI Lines: Pending PICC line placement for outpatient iv abx therapy  Foley:  N/A Code Status:  full code Last date of multidisciplinary goals of care discussion [09/03/22]  06/13: Updated pt at bedside regarding current plan of care.  Orthopedic surgeon also updated pt and he is also going to update pts daughter via telephone  Labs   CBC: Recent Labs  Lab 09/02/22 1454 09/02/22 2137 09/03/22 0214  WBC 5.7 6.9 7.6  NEUTROABS 3.2  --  5.6  HGB 10.0* 6.6* 7.7*  HCT 32.6* 22.1* 24.8*  MCV 89.6 91.7 90.8  PLT 401* 281 235    Basic Metabolic Panel: Recent Labs  Lab 09/02/22 1454 09/03/22 0214  NA 131* 131*  K 5.1 5.9*  CL 101 105  CO2 19* 21*  GLUCOSE 73 149*  BUN 20 16  CREATININE 0.83 0.60  CALCIUM 8.6* 8.3*   GFR: Estimated Creatinine Clearance: 61.2 mL/min (by C-G  formula based on SCr of 0.6 mg/dL). Recent Labs  Lab 09/02/22 1454 09/02/22 2137 09/03/22 0214  WBC 5.7 6.9 7.6    Liver Function Tests: Recent Labs  Lab 09/02/22 1454  AST 21  ALT 18  ALKPHOS 101  BILITOT 0.5  PROT 7.1  ALBUMIN 3.7   No results for input(s): "LIPASE", "AMYLASE" in the last 168 hours. No results for input(s): "AMMONIA" in the last 168 hours.  ABG No results found for: "PHART", "PCO2ART", "PO2ART", "HCO3", "TCO2", "ACIDBASEDEF", "O2SAT"   Coagulation Profile: No results for input(s): "INR", "PROTIME" in the last 168 hours.  Cardiac Enzymes: Recent Labs  Lab 09/02/22 2131  CKTOTAL 73    HbA1C: No results found for: "HGBA1C"  CBG: Recent Labs  Lab 09/02/22 2348  GLUCAP 124*    Review of Systems: Positives in BOLD   Gen: Denies fever, chills, weight change, fatigue, night sweats HEENT: Denies blurred vision, double vision, hearing loss,  tinnitus, sinus congestion, rhinorrhea, sore throat, neck stiffness, dysphagia PULM: Denies shortness of breath, cough, sputum production, hemoptysis, wheezing CV: Denies chest pain, edema, orthopnea, paroxysmal nocturnal dyspnea, palpitations GI: Denies abdominal pain, nausea, vomiting, diarrhea, hematochezia, melena, constipation, change in bowel habits GU: Denies dysuria, hematuria, polyuria, oliguria, urethral discharge Endocrine: Denies hot or cold intolerance, polyuria, polyphagia or appetite change Derm: Denies rash, dry skin, scaling or peeling skin change Heme: Denies easy bruising, bleeding, bleeding gums Musc: postop left hip pain  Neuro: Denies headache, numbness, weakness, slurred speech, loss of memory or consciousness   Past Medical History:  She,  has a past medical history of Abnormal EKG, Anemia, Arthritis, Depression, Fibromyalgia, GERD (gastroesophageal reflux disease), History of hiatal hernia, Hypertension, Scoliosis, and Tachycardia.   Surgical History:   Past Surgical History:  Procedure Laterality Date   APPLICATION OF WOUND VAC Left 05/11/2022   Procedure: APPLICATION OF WOUND VAC  [id number];  Surgeon: Donato Heinz, MD;  Location: [facility] ORS;  Service: Orthopedics;  Laterality: Left;  [id number]   CESAREAN SECTION     x 4   COSMETIC SURGERY     lip injury   FRACTURE SURGERY Right    foot ORIF-age 20   HERNIA REPAIR Left    inguinal   INCISION AND DRAINAGE Left 05/11/2022   Procedure: INCISION AND DRAINAGE EVACUATION HEMATOMA LEFT HIP;  Surgeon: Donato Heinz, MD;  Location: [facility] ORS;  Service: Orthopedics;  Laterality: Left;   KNEE ARTHROPLASTY Right 01/27/2021   Procedure: COMPUTER ASSISTED TOTAL KNEE ARTHROPLASTY;  Surgeon: Donato Heinz, MD;  Location: [facility] ORS;  Service: Orthopedics;  Laterality: Right;   MOUTH SURGERY     implants   TONSILLECTOMY     TOTAL HIP ARTHROPLASTY Left 04/15/2022   Procedure: TOTAL HIP ARTHROPLASTY;  Surgeon: Donato Heinz, MD;  Location: [facility] ORS;  Service: Orthopedics;  Laterality: Left;     Social History:   reports that she has never smoked. She has never used smokeless tobacco. She reports that she does not drink alcohol and does not use drugs.   Family History:  Her family history is negative for Breast cancer.   Allergies No Known Allergies   Home Medications  Prior to Admission medications   Medication Sig Start Date End Date Taking? Authorizing Provider  acetaminophen (TYLENOL) 500 MG tablet Take 1,000 mg by mouth 2 (two) times daily.   Yes [provider]  alendronate (FOSAMAX) 70 MG tablet Take 70 mg by mouth once a week. Take with a full glass  of water on an empty stomach. Wednesdays   Yes [provider]  amLODipine (NORVASC) 5 MG tablet Take 5 mg by mouth every evening. 10/31/20  Yes [provider]  b complex vitamins capsule Take 1 capsule by mouth daily.   Yes [provider]  Biotin [address] MCG TABS Take 10,000 mcg by mouth daily.   Yes [provider]  carvedilol (COREG) 6.25 MG tablet Take 6.25 mg by mouth 2 (two) times daily. 12/05/20  Yes [provider]  celecoxib (CELEBREX) 200 MG capsule Take 1 capsule (200 mg total) by mouth 2 (two) times daily. 04/16/22  Yes Dedra Skeens, PA-C  cetirizine (ZYRTEC) 10 MG tablet Take 10 mg by mouth daily as needed for allergies.   Yes [provider]  DULoxetine HCl 60 MG CSDR Take 60 mg by mouth every morning. 05/21/16  Yes [provider]  Ferrous Sulfate (SLOW FE PO) Take 1 tablet by mouth daily.   Yes [provider]  gabapentin (NEURONTIN) 600 MG tablet Take 600-1,200 mg by mouth See admin instructions. Take 1200 mg in the morning and 600 mg noon and 1200 mg at bedtime 04/03/16  Yes [provider]  lisinopril (ZESTRIL) 40 MG tablet Take 40 mg by mouth every morning. 12/18/20  Yes [provider]  Multiple Vitamins-Minerals (MULTIVITAMIN WITH MINERALS)  tablet Take 1 tablet by mouth daily. Senior   Yes [provider]  traMADol (ULTRAM) 50 MG tablet Take 1-2 tablets (50-100 mg total) by mouth every 4 (four) hours as needed for moderate pain. 05/13/22  Yes Dedra Skeens, PA-C  traZODone (DESYREL) 50 MG tablet Take 50 mg by mouth at bedtime. 05/18/16  Yes [provider]  Vitamin D3 (VITAMIN D) 25 MCG tablet Take 1,000 Units by mouth daily.   Yes [provider]     Critical care time: 65 minutes     Zada Girt, AGNP  Pulmonary/Critical Care Pager [phone] (please enter 7 digits) PCCM Consult Pager [phone] (please enter 7 digits)

## 2022-09-03 NOTE — Progress Notes (Signed)
Peripherally Inserted Central Catheter Placement  The IV Nurse has discussed with the patient and/or persons authorized to consent for the patient, the purpose of this procedure and the potential benefits and risks involved with this procedure.  The benefits include less needle sticks, lab draws from the catheter, and the patient may be discharged home with the catheter. Risks include, but not limited to, infection, bleeding, blood clot (thrombus formation), and puncture of an artery; nerve damage and irregular heartbeat and possibility to perform a PICC exchange if needed/ordered by physician.  Alternatives to this procedure were also discussed.  Bard Power PICC patient education guide, fact sheet on infection prevention and patient information card has been provided to patient /or left at bedside.    PICC Placement Documentation  PICC Double Lumen 09/03/22 Right Brachial 40 cm 0 cm (Active)  Indication for Insertion or Continuance of Line Prolonged intravenous therapies;Vasoactive infusions 09/03/22 1013  Exposed Catheter (cm) 0 cm 09/03/22 1013  Site Assessment Clean, Dry, Intact 09/03/22 1013  Lumen #1 Status Flushed;Saline locked;Blood return noted 09/03/22 1013  Lumen #2 Status Flushed;Saline locked;Blood return noted 09/03/22 1013  Dressing Type Transparent;Securing device 09/03/22 1013  Dressing Status Antimicrobial disc in place;Clean, Dry, Intact 09/03/22 1013  Safety Lock Not Applicable 09/03/22 1013  Line Care Connections checked and tightened 09/03/22 1013  Line Adjustment (NICU/IV Team Only) No 09/03/22 1013  Dressing Intervention New dressing;Other (Comment) 09/03/22 1013  Dressing Change Due 09/10/22 09/03/22 1013    Patient's daughter, Rockie Neighbours, signed PICC consent via telephone. Verified by 2 IV/PICC RNs.   Annett Fabian 09/03/2022, 10:15 AM

## 2022-09-04 DIAGNOSIS — T8452XA Infection and inflammatory reaction due to internal left hip prosthesis, initial encounter: Secondary | ICD-10-CM | POA: Diagnosis not present

## 2022-09-04 DIAGNOSIS — A4902 Methicillin resistant Staphylococcus aureus infection, unspecified site: Secondary | ICD-10-CM | POA: Diagnosis not present

## 2022-09-04 LAB — TYPE AND SCREEN
ABO/RH(D): B POS
Antibody Screen: NEGATIVE
Unit division: 0
Unit division: 0
Unit division: 0

## 2022-09-04 LAB — BPAM RBC
Blood Product Expiration Date: 202406182359
Blood Product Expiration Date: 202406242359
ISSUE DATE / TIME: 202406122316
ISSUE DATE / TIME: 202406130242
ISSUE DATE / TIME: 202406131710
Unit Type and Rh: 5100
Unit Type and Rh: 5100

## 2022-09-04 LAB — CBC WITH DIFFERENTIAL/PLATELET
Abs Immature Granulocytes: 0.04 10*3/uL (ref 0.00–0.07)
Basophils Absolute: 0.1 10*3/uL (ref 0.0–0.1)
Basophils Relative: 1 %
Eosinophils Absolute: 0.2 10*3/uL (ref 0.0–0.5)
Eosinophils Relative: 3 %
HCT: 28.5 % — ABNORMAL LOW (ref 36.0–46.0)
Hemoglobin: 9.5 g/dL — ABNORMAL LOW (ref 12.0–15.0)
Immature Granulocytes: 1 %
Lymphocytes Relative: 19 %
Lymphs Abs: 1.1 10*3/uL (ref 0.7–4.0)
MCH: 28.8 pg (ref 26.0–34.0)
MCHC: 33.3 g/dL (ref 30.0–36.0)
MCV: 86.4 fL (ref 80.0–100.0)
Monocytes Absolute: 0.7 10*3/uL (ref 0.1–1.0)
Monocytes Relative: 12 %
Neutro Abs: 3.8 10*3/uL (ref 1.7–7.7)
Neutrophils Relative %: 64 %
Platelets: 231 10*3/uL (ref 150–400)
RBC: 3.3 MIL/uL — ABNORMAL LOW (ref 3.87–5.11)
RDW: 16 % — ABNORMAL HIGH (ref 11.5–15.5)
WBC: 5.8 10*3/uL (ref 4.0–10.5)
nRBC: 0 % (ref 0.0–0.2)

## 2022-09-04 LAB — BASIC METABOLIC PANEL
Anion gap: 7 (ref 5–15)
BUN: 8 mg/dL (ref 8–23)
CO2: 25 mmol/L (ref 22–32)
Calcium: 8.1 mg/dL — ABNORMAL LOW (ref 8.9–10.3)
Chloride: 102 mmol/L (ref 98–111)
Creatinine, Ser: 0.46 mg/dL (ref 0.44–1.00)
GFR, Estimated: >60 mL/min (ref >60)
Glucose, Bld: 98 mg/dL (ref 70–99)
Potassium: 4.2 mmol/L (ref 3.5–5.1)
Sodium: 134 mmol/L — ABNORMAL LOW (ref 135–145)

## 2022-09-04 LAB — PHOSPHORUS: Phosphorus: 2.8 mg/dL (ref 2.5–4.6)

## 2022-09-04 LAB — AEROBIC/ANAEROBIC CULTURE W GRAM STAIN (SURGICAL/DEEP WOUND): Gram Stain: NONE SEEN

## 2022-09-04 LAB — MAGNESIUM: Magnesium: 1.6 mg/dL — ABNORMAL LOW (ref 1.7–2.4)

## 2022-09-04 MED ORDER — RIFAMPIN 300 MG PO CAPS
300.0000 mg | ORAL_CAPSULE | Freq: Two times a day (BID) | ORAL | Status: DC
  Administered 2022-09-05 – 2022-09-07 (×5): 300 mg via ORAL
  Filled 2022-09-04 (×5): qty 1

## 2022-09-04 MED ORDER — MAGNESIUM SULFATE 2 GM/50ML IV SOLN
2.0000 g | Freq: Once | INTRAVENOUS | Status: AC
  Administered 2022-09-04: 2 g via INTRAVENOUS
  Filled 2022-09-04: qty 50

## 2022-09-04 NOTE — Progress Notes (Signed)
Date of Admission:  09/02/2022      ID: Gina Matthews is a 72 y.o. female Principal Problem:   Prosthetic joint infection of left hip (HCC) Active Problems:   Hyperkalemia   Acute blood loss as cause of postoperative anemia   Shock circulatory (HCC)    Subjective: Pt stable No sob No fever  Medications:   sodium chloride   Intravenous Once   ascorbic acid  1,000 mg Oral Daily   aspirin  81 mg Oral BID   Chlorhexidine Gluconate Cloth  6 each Topical Daily   DULoxetine  60 mg Oral Daily   feeding supplement  237 mL Oral BID BM   ferrous sulfate  325 mg Oral BID WC   gabapentin  1,200 mg Oral BID   And   gabapentin  600 mg Oral Q1500   loratadine  10 mg Oral Daily   magnesium hydroxide  30 mL Oral Daily   metoCLOPramide  10 mg Oral TID AC & HS   multivitamin with minerals  1 tablet Oral Daily   pantoprazole  40 mg Oral BID   [START ON 09/05/2022] rifampin  300 mg Oral Q12H   senna-docusate  1 tablet Oral BID   sodium chloride flush  10-40 mL Intracatheter Q12H   traZODone  50 mg Oral QHS    Objective: Vital signs in last 24 hours: Patient Vitals for the past 24 hrs:  BP Temp Temp src Pulse Resp SpO2  09/04/22 1213 125/76 98.3 F (36.8 C) Oral -- 17 100 %  09/04/22 0900 120/85 -- -- (!) 106 (!) 22 100 %  09/04/22 0800 128/65 98.3 F (36.8 C) Oral (!) 102 17 95 %  09/04/22 0600 138/85 -- -- 98 18 100 %  09/04/22 0500 121/65 -- -- 86 16 100 %  09/04/22 0400 108/60 98.2 F (36.8 C) Oral 80 13 100 %  09/04/22 0300 122/63 -- -- 76 13 100 %  09/04/22 0200 (!) 106/59 98.3 F (36.8 C) Oral 84 16 100 %  09/04/22 0100 (!) 102/56 -- -- 77 12 99 %  09/04/22 0000 115/65 -- -- 87 15 98 %  09/03/22 2300 (!) 97/58 -- -- -- 13 --  09/03/22 2200 107/60 -- -- -- 14 --  09/03/22 2100 (!) 88/72 -- -- 82 19 99 %      PHYSICAL EXAM:  General: Alert, cooperative, no distress, appears stated age.  Lungs: few basal crepts Heart: Tachycardia. Abdomen: Soft, non-tender,not  distended. Bowel sounds normal. No masses Extremities: left hip- vac covering surgical site Skin: No rashes or lesions. Or bruising Lymph: Cervical, supraclavicular normal. Neurologic: Grossly non-focal  Lab Results    Latest Ref Rng & Units 09/04/2022    4:33 AM 09/03/2022    9:11 PM 09/03/2022    2:24 PM  CBC  WBC 4.0 - 10.5 K/uL 5.8     Hemoglobin 12.0 - 15.0 g/dL 9.5  9.4  7.8   Hematocrit 36.0 - 46.0 % 28.5  28.3  23.9   Platelets 150 - 400 K/uL 231          Latest Ref Rng & Units 09/04/2022    4:33 AM 09/03/2022    8:43 AM 09/03/2022    2:14 AM  CMP  Glucose 70 - 99 mg/dL 98  95  161   BUN 8 - 23 mg/dL 8  14  16    Creatinine 0.44 - 1.00 mg/dL 0.96  0.45  4.09   Sodium 135 - 145  mmol/L 134  133  131   Potassium 3.5 - 5.1 mmol/L 4.2  4.8  5.9   Chloride 98 - 111 mmol/L 102  104  105   CO2 22 - 32 mmol/L 25  21  21    Calcium 8.9 - 10.3 mg/dL 8.1  8.0  8.3   Total Protein 6.5 - 8.1 g/dL  5.2    Total Bilirubin 0.3 - 1.2 mg/dL  0.8    Alkaline Phos 38 - 126 U/L  63    AST 15 - 41 U/L  19    ALT 0 - 44 U/L  12        Microbiology: BC- NG so far 3 Wound /tissue culture staph aureus Studies/Results: DG Hip Port Unilat With Pelvis 1V Left  Result Date: 09/02/2022 CLINICAL DATA:  Status post left hip I and D EXAM: DG HIP (WITH OR WITHOUT PELVIS) 1V PORT LEFT COMPARISON:  04/15/2022 FINDINGS: Left hip replacement is noted. Surgical drains are noted in place. Multiple antibiotic beads are seen. No acute bony abnormality is noted. IMPRESSION: Postsurgical changes in the left hip. Electronically Signed   By: Alcide Clever M.D.   On: 09/02/2022 22:26   [place] EKG SITE RITE  Result Date: 09/02/2022 If Site Rite image not attached, placement could not be confirmed due to current cardiac rhythm.    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

## 2022-09-04 NOTE — Progress Notes (Signed)
Pharmacy Antibiotic Note  Gina Matthews is a 72 y.o. female admitted on 09/02/2022 with wound infection.  Had MRSA in a superficial culture in March 2024, on Bactrim prior to admission. Pharmacy has been consulted for daptomycin dosing.  Plan: continue daptomycin 8 mg/kg (TBW) q1400 per indication, BMI & renal fxn. ---CK every 3 days per protocol ---follow renal function for needed dose adjustments  Pharmacy will continue to follow and will adjust abx dosing whenever warranted.  Temp (24hrs), Avg:98.2 F (36.8 C), Min:98 F (36.7 C), Max:98.3 F (36.8 C)   Recent Labs  Lab 09/02/22 1454 09/02/22 2137 09/03/22 0214 09/03/22 0843 09/03/22 1424 09/04/22 0433  WBC 5.7 6.9 7.6 6.2  --  5.8  CREATININE 0.83  --  0.60 0.56  --  0.46  LATICACIDVEN  --   --   --   --  0.9  --      Estimated Creatinine Clearance: 61.2 mL/min (by C-G formula based on SCr of 0.46 mg/dL).    No Known Allergies  Antimicrobials this admission: 6/12 cefazolin >> x 1 dose 6/12 vancomycin >> x 1 dose 6/13 daptomycin >>  Microbiology results: 6/12 BCx: NG x 2 days 6/12 Surgical Cx x 3: S aureus  Thank you for allowing pharmacy to be a part of this patient's care.  Burnis Medin, PharmD, BCPS  09/04/2022 8:25 AM

## 2022-09-04 NOTE — Progress Notes (Signed)
Physical Therapy Treatment Patient Details Name: Gina Matthews MRN: [id number] DOB: 07/08/1950 Today's Date: 09/04/2022   History of Present Illness 72 y/o female s/p L hip I&D with poly and femoral head exchange on 09/02/22. Admitted after procedure for hypotension 2/2 acute blood loss anemia. PMH: L THA on 04/15/22, HTN, fibromyalgia    PT Comments    Pt was long sitting in bed upon arrival. She is A and O + agreeable  to session. Pt was able to recall 2 of 3 hip precautions. Does follow precautions during session with minimal vcs. Author issued HEP handout with discussion/performance. She tolerated getting OOB and ambulating into the hallway with supervision only. No LOB however pt limits distance by pain/fatigue. Sao2 stable but HR elevates into low 130s (bpm). Quickly resolved to ~ 100 bpm with seated rest. PT overall is progressing well. DC recs remain appropriate. Will perform stairs once transferred out of ICU setting.    Recommendations for follow up therapy are one component of a multi-disciplinary discharge planning process, led by the attending physician.  Recommendations may be updated based on patient status, additional functional criteria and insurance authorization.  Assistance Recommended at Discharge Set up Supervision/Assistance  Patient can return home with the following A little help with walking and/or transfers;A little help with bathing/dressing/bathroom;Assistance with cooking/housework;Assist for transportation;Help with stairs or ramp for entrance   Equipment Recommendations  None recommended by PT       Precautions / Restrictions Precautions Precautions: Posterior Hip;Fall Precaution Comments: wound vac/ Hemovac Restrictions Weight Bearing Restrictions: Yes RLE Weight Bearing: Weight bearing as tolerated     Mobility  Bed Mobility Overal bed mobility: Modified Independent  General bed mobility comments: no physical assistance required to exit R side of bed.  Vcs for technique improvements only    Transfers Overall transfer level: Needs assistance Equipment used: Rollator (4 wheels) Transfers: Sit to/from Stand Sit to Stand: Supervision  General transfer comment: Chartered loss adjuster only assisted with management of line/leads/woundvac/ICU telemetry    Ambulation/Gait Ambulation/Gait assistance: Supervision Gait Distance (Feet): 50 Feet Assistive device: Rollator (4 wheels) Gait Pattern/deviations: Step-through pattern, Decreased stride length, Decreased stance time - left Gait velocity: decreased  General Gait Details: no LOB however limited distance due to pt pain/HR into low 130s. pt endorses not ambulating long distances at baseline. ill progress gait distance and stairs once transferred out of ICU.     Balance Overall balance assessment: Needs assistance Sitting-balance support: Feet supported Sitting balance-Leahy Scale: Normal     Standing balance support: Bilateral upper extremity supported, During functional activity Standing balance-Leahy Scale: Fair Standing balance comment: no LOB noted during session       Cognition Arousal/Alertness: Awake/alert Behavior During Therapy: WFL for tasks assessed/performed Overall Cognitive Status: Within Functional Limits for tasks assessed        General Comments: Pt is A and O x4 and agreeable to session. Discussed baseline abilities and home situation.           General Comments General comments (skin integrity, edema, etc.): Reviewed hip precautions and safe functional mobility. Chartered loss adjuster issued HEP handout and pt endorses abilities to independently perform.      Pertinent Vitals/Pain Pain Assessment Pain Assessment: 0-10 Pain Score: 7  Pain Location: R hip Pain Descriptors / Indicators: Discomfort, Grimacing Pain Intervention(s): Limited activity within patient's tolerance, Monitored during session, Premedicated before session, Repositioned, Patient requesting pain meds-RN notified      PT Goals (current goals can now be found in the care plan section)  Acute Rehab PT Goals Patient Stated Goal: to go home Progress towards PT goals: Progressing toward goals    Frequency    7X/week      PT Plan Current plan remains appropriate       AM-PAC PT "6 Clicks" Mobility   Outcome Measure  Help needed turning from your back to your side while in a flat bed without using bedrails?: None Help needed moving from lying on your back to sitting on the side of a flat bed without using bedrails?: None Help needed moving to and from a bed to a chair (including a wheelchair)?: A Little Help needed standing up from a chair using your arms (e.g., wheelchair or bedside chair)?: A Little Help needed to walk in hospital room?: A Little Help needed climbing 3-5 steps with a railing? : A Little 6 Click Score: 20    End of Session   Activity Tolerance: Patient tolerated treatment well Patient left: in chair;with call bell/phone within reach Nurse Communication: Mobility status PT Visit Diagnosis: Unsteadiness on feet (R26.81);Muscle weakness (generalized) (M62.81);Other abnormalities of gait and mobility (R26.89)     Time: 9563-8756 PT Time Calculation (min) (ACUTE ONLY): 32 min  Charges:  $Gait Training: 8-22 mins $Therapeutic Exercise: 8-22 mins                    Jetta Lout PTA 09/04/22, 10:10 AM

## 2022-09-04 NOTE — Treatment Plan (Signed)
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

## 2022-09-04 NOTE — Progress Notes (Signed)
PHARMACY CONSULT NOTE  Pharmacy Consult for Electrolyte Monitoring and Replacement   Recent Labs: Potassium (mmol/L)  Date Value  09/04/2022 4.2   Magnesium (mg/dL)  Date Value  16/12/9602 1.6 (L)   Calcium (mg/dL)  Date Value  54/11/8117 8.1 (L)   Albumin (g/dL)  Date Value  14/78/2956 2.9 (L)   Phosphorus (mg/dL)  Date Value  21/30/8657 2.8   Sodium (mmol/L)  Date Value  09/04/2022 134 (L)     Assessment: 72 yo female who previously underwent a left total hip arthroplasty on 04/15/22. Her postoperative course was complicated by a large hematoma of the left hip requiring surgical evacuation on 05/11/22 admitted w/ likely MDR cellulitis  Goal of Therapy:  Electrolytes WNL  Plan:  ---2 grams IV magnesium sulfate x 1 ---recheck electrolytes in am  Lowella Bandy ,PharmD Clinical Pharmacist 09/04/2022 7:10 AM

## 2022-09-04 NOTE — Plan of Care (Signed)
  Problem: Education: Goal: Understanding of discharge needs will improve Outcome: Progressing   Problem: Activity: Goal: Ability to avoid complications of mobility impairment will improve Outcome: Progressing Goal: Ability to tolerate increased activity will improve Outcome: Progressing   Problem: Clinical Measurements: Goal: Postoperative complications will be avoided or minimized Outcome: Progressing   Problem: Pain Management: Goal: Pain level will decrease with appropriate interventions Outcome: Progressing   Problem: Skin Integrity: Goal: Will show signs of wound healing Outcome: Progressing   Problem: Clinical Measurements: Goal: Will remain free from infection Outcome: Progressing Goal: Cardiovascular complication will be avoided Outcome: Progressing   Problem: Nutrition: Goal: Adequate nutrition will be maintained Outcome: Progressing   Problem: Elimination: Goal: Will not experience complications related to urinary retention Outcome: Progressing

## 2022-09-04 NOTE — Progress Notes (Signed)
ORTHOPAEDICS PROGRESS NOTE  PATIENT NAME: Gina Matthews DOB: 21-May-1950  MRN: [id number]  POD # 2: Left hip I&D, polyethylene & femoral head exchange, and placement of antibiotic-impregnated Stimulan beads  Subjective: Patient awake and alert.  Pain is well-controlled.  She did well with PT yesterday.  Objective: Vital signs in last 24 hours: Temp:  [98 F (36.7 C)-98.3 F (36.8 C)] 98.2 F (36.8 C) (06/14 0400) Pulse Rate:  [28-98] 98 (06/14 0600) Resp:  [12-27] 18 (06/14 0600) BP: (88-138)/(45-91) 138/85 (06/14 0600) SpO2:  [90 %-100 %] 100 % (06/14 0600)  Intake/Output from previous day: 06/13 0701 - 06/14 0700 In: 1085.8 [P.O.:240; I.V.:213; Blood:320; IV Piggyback:312.9] Out: 1735 [Urine:1620; Drains:115]  Recent Labs    09/02/22 1454 09/02/22 2137 09/03/22 0214 09/03/22 0843 09/03/22 1424 09/03/22 2111 09/04/22 0433  WBC 5.7 6.9 7.6 6.2  --   --  5.8  HGB 10.0* 6.6* 7.7* 8.9* 7.8* 9.4* 9.5*  HCT 32.6* 22.1* 24.8* 27.6* 23.9* 28.3* 28.5*  PLT 401* 281 235 234  --   --  231  K 5.1  --  5.9* 4.8  --   --  4.2  CL 101  --  105 104  --   --  102  CO2 19*  --  21* 21*  --   --  25  BUN 20  --  16 14  --   --  8  CREATININE 0.83  --  0.60 0.56  --   --  0.46  GLUCOSE 73  --  149* 95  --   --  98  CALCIUM 8.6*  --  8.3* 8.0*  --   --  8.1*  INR  --   --   --  1.1  --   --   --    Tissue cultures: Rare to few Staph aureus on all 3 specimens. Sensitivities pending.  EXAM General: Well developed, well nourished female in no appaent discomfort.  Left lower extremity: WoundVAC and hemovac in place. Left thigh is soft with no erythema. Homan test is negative. Neurologic: Sensory and motor function are grossly intact.   Assessment: Left hip periprosthetic hip infection   Plan: BP and hemoglobin appear to have stabilized. Appreciate Intensivist's assistance. PICC line in place. Daptomycin and ceftriaxone as per Dr. Rivka Safer. I anticipate she will discontinue  the ceftriaxone with this morning's culture results. Minimal output from hemovac. Will plan to discontinue either later today or tomorrow AM. Continue with PT and OT. I would like to have the patient safe for eventual discharge to home.   Jelena Malicoat P. Angie Fava M.D.

## 2022-09-04 NOTE — TOC Progression Note (Addendum)
Transition of Care [facility]) - Progression Note    Patient Details  Name: Gina Matthews MRN: [id number] Date of Birth: Jul 23, 1950  [facility]) CM/SW Contact  Darolyn Rua, [place] Phone Number: 09/04/2022, 10:59 AM  Clinical Narrative:     Patient is set up with [facility] PT and RN services and [facility] for home IV infusions for 6 weeks IV antibiotcs. Jeri Modena with [employer] to complete teaching today . No dme needs.  (Patient was previously [facility] however for IV antibiotics patients insurance would be $140/wk, [facility] will be covered by insurance)   Expected Discharge Plan: Home w Home Health Services Barriers to Discharge: Continued Medical Work up  Expected Discharge Plan and Services                                               Social Determinants of Health (SDOH) Interventions SDOH Screenings   Food Insecurity: No Food Insecurity (09/03/2022)  Housing: Low Risk  (09/03/2022)  Transportation Needs: No Transportation Needs (09/03/2022)  Utilities: Not At Risk (09/03/2022)  Tobacco Use: Low Risk  (09/03/2022)    Readmission Risk Interventions     No data to display

## 2022-09-04 NOTE — Progress Notes (Signed)
NAME:  Gina Matthews, MRN:  [id number], DOB:  18-Sep-1950, LOS: 2 ADMISSION DATE:  09/02/2022, CONSULTATION DATE: 09/03/2022 REFERRING MD: Dr. Ernest Pine, CHIEF COMPLAINT: Hypotension    History of Present Illness:  This is a 72 yo female who previously underwent a left total hip arthroplasty on 04/15/22.  Her postoperative course was complicated by a large hematoma of the left hip requiring surgical evacuation on 05/11/22 along with Prevana wound vac placement.  Pt subsequently discharge to a skilled nursing facility for rehab.  During outpatient ortho follow-up visit on 06/11/22 pt noted to have persistent left hip drainage with erythema for which she was prescribed bactrim with resolution of the erythema.  Superficial cultures were positive for MRSA wound care consulted and wound vac placed.  On 06/7 during ortho follow-up visit pt c/o tightness/discomfort at the left hip incision site.  A joint aspiration was performed for culture and pt instructed to continue bactrim.  Aspiration grain stain results demonstrated no organisms but many WBC's.  On 06/11 ortho recommended incision, irrigation and debridement of the left hip with possible need for deep exploration and placement of antibiotic-impregnated stimulation beads.    Pt presented to [facility] on 06/12 for left hip surgical procedure.  Pt underwent incision, irrigation, and debridement of the left hip, polyethylene and femoral head exchange, and placement of antibiotic impregnated stimulation beads.  Postop pt hypotensive secondary to acute blood loss anemia and required blood products and neo-synephrine gtt.  Pt also hyperkalemia.  She was admitted to the stepdown unit per orthopedic surgery.  Due to persistent hypotension requiring neo-synephrine gtt PCCM team consulted on 06/13 to assist with management.  Pertinent  Medical History  Iron Deficiency Anemia  Arthritis  Depression  Fibromyalgia  GERD  Hiatal Hernia HTN Scoliosis  Tachycardia    Significant Hospital Events: Including procedures, antibiotic start and stop dates in addition to other pertinent events   06/12: Pt admitted with left hip surgical site infection s/p left total hip arthroplasty on 04/15/22 requiring incision, irrigation, and debridement of the left hip, polyethylene and femoral head exchange, and placement of antibiotic impregnated stimulation beads.  Postop pt hypotensive secondary to acute blood loss anemia requiring blood products and neo-synephrine gtt 06/13: Due to continued hypotension requiring neo-synephrine gtt PCCM team consulted to assist with management 09/04/22- patient is off vasopressor support.  She is being optimized for [facility] transfer for medical management.   Micro Data:   06/12: Aerobic/Anaerobic Culture Left Hip Tissue>>negative  06/12: MRSA PCR>> negative  06/12: Blood x2>> 06/13: Aerobic/Anaerobic Culture Left Hip Tissue>>negative   Anti-infectives (From admission, onward)    Start     Dose/Rate Route Frequency Ordered Stop   09/03/22 2215  cefTRIAXone (ROCEPHIN) 2 g in sodium chloride 0.9 % 100 mL IVPB  Status:  Discontinued        2 g 200 mL/hr over 30 Minutes Intravenous Daily at bedtime 09/03/22 2201 09/04/22 0814   09/03/22 1400  DAPTOmycin (CUBICIN) 500 mg in sodium chloride 0.9 % IVPB        8 mg/kg  65.3 kg 120 mL/hr over 30 Minutes Intravenous Daily 09/02/22 2343     09/02/22 1800  gentamicin (GARAMYCIN) injection  Status:  Discontinued          As needed 09/02/22 1800 09/02/22 2120   09/02/22 1759  vancomycin (VANCOCIN) powder  Status:  Discontinued          As needed 09/02/22 1759 09/02/22 2120   09/02/22 1430  ceFAZolin (ANCEF)  IVPB 2g/100 mL premix        2 g 200 mL/hr over 30 Minutes Intravenous On call to O.R. 09/02/22 1413 09/02/22 1642   09/02/22 1415  vancomycin (VANCOCIN) IVPB 1000 mg/200 mL premix        1,000 mg 200 mL/hr over 60 Minutes Intravenous  Once 09/02/22 1413 09/03/22 0005      Interim History  / Subjective:  Pt complains of 6/10 left hip pain although she states this has improved.  Currently requiring neo-synephrine gtt @20  mcg/min to maintain map >65  Objective   Blood pressure 125/76, pulse (!) 106, temperature 98.3 F (36.8 C), temperature source Oral, resp. rate 17, height 5' 3.5" (1.613 m), weight 67.5 kg, SpO2 100 %.        Intake/Output Summary (Last 24 hours) at 09/04/2022 1319 Last data filed at 09/04/2022 1028 Gross per 24 hour  Intake 1326.05 ml  Output 1715 ml  Net -388.95 ml    Filed Weights   09/02/22 1417 09/02/22 2351  Weight: 65.3 kg 67.5 kg    Examination: General: Acutely-ill appearing female, NAD on RA  HENT: Supple, no JVD  Lungs: Clear throughout, even, non labored  Cardiovascular: NSR, s1s2, no m/r/g, 2+ radial/1+ distal pulses, 1+ left hip edema  Abdomen: Hypoactive BS x4, soft, non tender, non distended  Extremities: Moves all extremities  Skin: Left hip wound vac and hemovac present wnl  Neuro: Alert and oriented, following commands, PERRLA GU: Required in and out cath due to urinary retention   Resolved Hospital Problem list     Assessment & Plan:  #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 s/p I&D, polyethylene and femoral head exchange, and placement of antibiotic impregnated stimulation beads~06/12 - Trend WBC and monitor fever curve - Trend PCT - Follow cultures  - ID consulted appreciate input~abx therapy as outlined above and will need 6 weeks of iv abx pt pending PICC line placement  - Orthopedic surgery primary  - PT/OT once hypotension resolved   #Hypotension secondary to acute blood loss anemia  - Continuous telemetry monitoring  - Blood products and neo-synephrine gtt prn to maintain map >65 - Hold outpatient antihypertensives for now   #Hyperkalemia  - Trend BMP  - Replace electrolytes as indicated   #Postop acute blood loss anemia  Hx: Iron deficiency anemia  - VTE px:  SCD's, avoid chemical px  - Will hold outpatient celebrex for now  - Trend CBC and coags  - Monitor for s/sx of bleeding  - Transfuse for hgb <8 and/or active signs of bleeding  - Monitor hemovac and wound vac output   #GERD  - Continue PPI   #Postop pain  - Continue scheduled acetaminophen and prn narcotics for pain management  - Continue bowel regimen    Best Practice (right click and "Reselect all SmartList Selections" daily)   Diet/type: Regular consistency (see orders) DVT prophylaxis: SCD GI prophylaxis: PPI Lines: Pending PICC line placement for outpatient iv abx therapy  Foley:  N/A Code Status:  full code Last date of multidisciplinary goals of care discussion [09/03/22]   Labs   CBC: Recent Labs  Lab 09/02/22 1454 09/02/22 2137 09/03/22 0214 09/03/22 0843 09/03/22 1424 09/03/22 2111 09/04/22 0433  WBC 5.7 6.9 7.6 6.2  --   --  5.8  NEUTROABS 3.2  --  5.6 3.6  --   --  3.8  HGB 10.0* 6.6* 7.7* 8.9* 7.8* 9.4* 9.5*  HCT 32.6* 22.1*  24.8* 27.6* 23.9* 28.3* 28.5*  MCV 89.6 91.7 90.8 89.3  --   --  86.4  PLT 401* 281 235 234  --   --  231     Basic Metabolic Panel: Recent Labs  Lab 09/02/22 1454 09/03/22 0214 09/03/22 0843 09/04/22 0433  NA 131* 131* 133* 134*  K 5.1 5.9* 4.8 4.2  CL 101 105 104 102  CO2 19* 21* 21* 25  GLUCOSE 73 149* 95 98  BUN 20 16 14 8   CREATININE 0.83 0.60 0.56 0.46  CALCIUM 8.6* 8.3* 8.0* 8.1*  MG  --   --  1.8 1.6*  PHOS  --   --  4.0 2.8    GFR: Estimated Creatinine Clearance: 61.2 mL/min (by C-G formula based on SCr of 0.46 mg/dL). Recent Labs  Lab 09/02/22 2137 09/03/22 0214 09/03/22 0843 09/03/22 1424 09/04/22 0433  PROCALCITON  --   --  <0.10  --   --   WBC 6.9 7.6 6.2  --  5.8  LATICACIDVEN  --   --   --  0.9  --      Liver Function Tests: Recent Labs  Lab 09/02/22 1454 09/03/22 0843  AST 21 19  ALT 18 12  ALKPHOS 101 63  BILITOT 0.5 0.8  PROT 7.1 5.2*  ALBUMIN 3.7 2.9*    No results for  input(s): "LIPASE", "AMYLASE" in the last 168 hours. No results for input(s): "AMMONIA" in the last 168 hours.  ABG    Component Value Date/Time   HCO3 24.9 09/03/2022 1424   ACIDBASEDEF 0.8 09/03/2022 1424   O2SAT 88.4 09/03/2022 1424     Coagulation Profile: Recent Labs  Lab 09/03/22 0843  INR 1.1    Cardiac Enzymes: Recent Labs  Lab 09/02/22 2131  CKTOTAL 73     HbA1C: No results found for: "HGBA1C"  CBG: Recent Labs  Lab 09/02/22 2348  GLUCAP 124*     Review of Systems: Positives in BOLD   Gen: Denies fever, chills, weight change, fatigue, night sweats HEENT: Denies blurred vision, double vision, hearing loss, tinnitus, sinus congestion, rhinorrhea, sore throat, neck stiffness, dysphagia PULM: Denies shortness of breath, cough, sputum production, hemoptysis, wheezing CV: Denies chest pain, edema, orthopnea, paroxysmal nocturnal dyspnea, palpitations GI: Denies abdominal pain, nausea, vomiting, diarrhea, hematochezia, melena, constipation, change in bowel habits GU: Denies dysuria, hematuria, polyuria, oliguria, urethral discharge Endocrine: Denies hot or cold intolerance, polyuria, polyphagia or appetite change Derm: Denies rash, dry skin, scaling or peeling skin change Heme: Denies easy bruising, bleeding, bleeding gums Musc: postop left hip pain  Neuro: Denies headache, numbness, weakness, slurred speech, loss of memory or consciousness   Past Medical History:  She,  has a past medical history of Abnormal EKG, Anemia, Arthritis, Depression, Fibromyalgia, GERD (gastroesophageal reflux disease), History of hiatal hernia, Hypertension, Scoliosis, and Tachycardia.   Surgical History:   Past Surgical History:  Procedure Laterality Date   APPLICATION OF WOUND VAC Left 05/11/2022   Procedure: APPLICATION OF WOUND VAC  [id number];  Surgeon: Donato Heinz, MD;  Location: [facility] ORS;  Service: Orthopedics;  Laterality: Left;  [id number]   CESAREAN SECTION     x 4    COSMETIC SURGERY     lip injury   FRACTURE SURGERY Right    foot ORIF-age 62   HERNIA REPAIR Left    inguinal   INCISION AND DRAINAGE Left 05/11/2022   Procedure: INCISION AND DRAINAGE EVACUATION HEMATOMA LEFT HIP;  Surgeon: Donato Heinz, MD;  Location: [facility] ORS;  Service: Orthopedics;  Laterality: Left;   INCISION AND DRAINAGE HIP Left 09/02/2022   Procedure: IRRIGATION AND DEBRIDEMENT HIP WITH POLY EXCHANGE;  Surgeon: Donato Heinz, MD;  Location: [facility] ORS;  Service: Orthopedics;  Laterality: Left;   KNEE ARTHROPLASTY Right 01/27/2021   Procedure: COMPUTER ASSISTED TOTAL KNEE ARTHROPLASTY;  Surgeon: Donato Heinz, MD;  Location: [facility] ORS;  Service: Orthopedics;  Laterality: Right;   MOUTH SURGERY     implants   TONSILLECTOMY     TOTAL HIP ARTHROPLASTY Left 04/15/2022   Procedure: TOTAL HIP ARTHROPLASTY;  Surgeon: Donato Heinz, MD;  Location: [facility] ORS;  Service: Orthopedics;  Laterality: Left;     Social History:   reports that she has never smoked. She has never used smokeless tobacco. She reports that she does not drink alcohol and does not use drugs.   Family History:  Her family history is negative for Breast cancer.   Allergies No Known Allergies   Home Medications  Prior to Admission medications   Medication Sig Start Date End Date Taking? Authorizing Provider  acetaminophen (TYLENOL) 500 MG tablet Take 1,000 mg by mouth 2 (two) times daily.   Yes [provider]  alendronate (FOSAMAX) 70 MG tablet Take 70 mg by mouth once a week. Take with a full glass of water on an empty stomach. Wednesdays   Yes [provider]  amLODipine (NORVASC) 5 MG tablet Take 5 mg by mouth every evening. 10/31/20  Yes [provider]  b complex vitamins capsule Take 1 capsule by mouth daily.   Yes [provider]  Biotin 16109 MCG TABS Take 10,000 mcg by mouth daily.   Yes [provider]  carvedilol (COREG) 6.25 MG tablet Take 6.25 mg by  mouth 2 (two) times daily. 12/05/20  Yes [provider]  celecoxib (CELEBREX) 200 MG capsule Take 1 capsule (200 mg total) by mouth 2 (two) times daily. 04/16/22  Yes Dedra Skeens, PA-C  cetirizine (ZYRTEC) 10 MG tablet Take 10 mg by mouth daily as needed for allergies.   Yes [provider]  DULoxetine HCl 60 MG CSDR Take 60 mg by mouth every morning. 05/21/16  Yes [provider]  Ferrous Sulfate (SLOW FE PO) Take 1 tablet by mouth daily.   Yes [provider]  gabapentin (NEURONTIN) 600 MG tablet Take 600-1,200 mg by mouth See admin instructions. Take 1200 mg in the morning and 600 mg noon and 1200 mg at bedtime 04/03/16  Yes [provider]  lisinopril (ZESTRIL) 40 MG tablet Take 40 mg by mouth every morning. 12/18/20  Yes [provider]  Multiple Vitamins-Minerals (MULTIVITAMIN WITH MINERALS) tablet Take 1 tablet by mouth daily. Senior   Yes [provider]  traMADol (ULTRAM) 50 MG tablet Take 1-2 tablets (50-100 mg total) by mouth every 4 (four) hours as needed for moderate pain. 05/13/22  Yes Dedra Skeens, PA-C  traZODone (DESYREL) 50 MG tablet Take 50 mg by mouth at bedtime. 05/18/16  Yes [provider]  Vitamin D3 (VITAMIN D) 25 MCG tablet Take 1,000 Units by mouth daily.   Yes [provider]     Critical care provider statement:   Total critical care time: 33 minutes   Performed by: Karna Christmas MD   Critical care time was exclusive of separately billable procedures and treating other patients.   Critical care was necessary to treat or prevent imminent or life-threatening deterioration.   Critical care was time spent personally  by me on the following activities: development of treatment plan with patient and/or surrogate as well as nursing, discussions with consultants, evaluation of patient's response to treatment, examination of patient, obtaining history from patient or surrogate, ordering and performing  treatments and interventions, ordering and review of laboratory studies, ordering and review of radiographic studies, pulse oximetry and re-evaluation of patient's condition.    Vida Rigger, M.D.  Pulmonary & Critical Care Medicine

## 2022-09-04 NOTE — Progress Notes (Signed)
PHARMACY CONSULT NOTE FOR:  OUTPATIENT  PARENTERAL ANTIBIOTIC THERAPY (OPAT)  Indication: L hip PJI with MRSA  Regimen: Daptomycin 500 mg IV Q24H + Rifampin PO 300 mg PO BID  End date: 10/14/22  Labs: Labs weekly on Monday or Tuesday while on IV antibiotics. Fax labs promptly to [phone]. Please obtain the following: - CBC with differential - CK  - CMP - CRP - ESR   (Please pull PICC at completion of IV antibiotics)   IV antibiotic discharge orders are pended. To discharging provider:  please sign these orders via discharge navigator,  Select New Orders & click on the button choice - Manage This Unsigned Work.     Thank you for allowing pharmacy to be a part of this patient's care.  Jani Gravel, PharmD PGY-2 Infectious Diseases Resident  09/04/2022 9:22 AM

## 2022-09-04 NOTE — Plan of Care (Addendum)
Attempted to call report to floor 1A, but receiving RN not available at the time.  Left call back # with secretary and will have RN call for report when able.  Report given to Marianne Sofia, RN - patient transferring from ICU 07 to 1A room 154

## 2022-09-04 NOTE — Discharge Instructions (Signed)
Instructions after Total Hip Replacement   Jillyan Plitt P. Jasmon Mattice, Jr., M.D.    Dept. of Orthopaedics & Sports Medicine [facility] [address] [place], [place]  [address]  Phone: [phone]   Fax: [phone]        [email or web address]        DIET: Drink plenty of non-alcoholic fluids. Resume your normal diet. Include foods high in fiber.  ACTIVITY:  You may use crutches or a walker with weight-bearing as tolerated, unless instructed otherwise. You may be weaned off of the walker or crutches by your Physical Therapist.  Do NOT reach below the level of your knees or cross your legs until allowed.    Continue doing gentle exercises. Exercising will reduce the pain and swelling, increase motion, and prevent muscle weakness.   Please continue to use the TED compression stockings for 6 weeks. You may remove the stockings at night, but should reapply them in the morning. Do not drive or operate any equipment until instructed.  WOUND CARE:  Continue to use ice packs periodically to reduce pain and swelling. Keep the incision clean and dry. You may bathe or shower after the staples are removed at the first office visit following surgery.  MEDICATIONS: You may resume your regular medications. Please take the pain medication as prescribed on the medication. Do not take pain medication on an empty stomach. You have been given a prescription for a blood thinner to prevent blood clots. Please take the medication as instructed. (NOTE: After completing a 2 week course of Lovenox, take one Enteric-coated aspirin once a day.) Pain medications and iron supplements can cause constipation. Use a stool softener (Senokot or Colace) on a daily basis and a laxative (dulcolax or miralax) as needed. Do not drive or drink alcoholic beverages when taking pain medications.  CALL THE OFFICE FOR: Temperature above 101 degrees Excessive bleeding or drainage on the dressing. Excessive swelling,  coldness, or paleness of the toes. Persistent nausea and vomiting.  FOLLOW-UP:  You should have an appointment to return to the office in 6 weeks after surgery. Arrangements have been made for continuation of Physical Therapy (either home therapy or outpatient therapy).     [facility] Department Directory         [email or web address]       [email or web address]          Cardiology  Appointments: [place] - [phone] [place] - [phone]  Endocrinology  Appointments: [place] - [phone] [place] - [phone]  Gastroenterology  Appointments: [place] - [phone] [place] - [phone]        General Surgery   Appointments: [place] - [phone]  Internal Medicine/Family Medicine  Appointments: [place] - [phone] [place] - [phone] [place] - [phone]  Metabolic and Weigh Loss Surgery  Appointments: [place] - [phone]        Neurology  Appointments: [place] - [phone] [place] - [phone]  Neurosurgery  Appointments: [place] - [phone]  Obstetrics & Gynecology  Appointments: [place] - [phone] [place] - [phone]        Pediatrics  Appointments: [place] - [phone] [place] - [phone]  Physiatry  Appointments: [place] -[phone]  Physical Therapy  Appointments: [place] - [phone] [place] - [phone]        Podiatry  Appointments: [place] - [phone] [place] - [phone]  Pulmonology  Appointments: [place] - [phone]  Rheumatology  Appointments: [place] - [phone]        [place] Location: [facility]  [address] , [place]  [address]  [place] Location: [facility] [address] [place], [place]  [address]  [place] Location: [facility] [address] [place], [place]  [address]    

## 2022-09-05 DIAGNOSIS — D62 Acute posthemorrhagic anemia: Secondary | ICD-10-CM | POA: Diagnosis not present

## 2022-09-05 LAB — BASIC METABOLIC PANEL
Anion gap: 9 (ref 5–15)
Anion gap: 8 (ref 5–15)
BUN: 7 mg/dL — ABNORMAL LOW (ref 8–23)
BUN: 11 mg/dL (ref 8–23)
CO2: 24 mmol/L (ref 22–32)
CO2: 26 mmol/L (ref 22–32)
Calcium: 8.6 mg/dL — ABNORMAL LOW (ref 8.9–10.3)
Calcium: 9.1 mg/dL (ref 8.9–10.3)
Chloride: 98 mmol/L (ref 98–111)
Chloride: 97 mmol/L — ABNORMAL LOW (ref 98–111)
Creatinine, Ser: 0.44 mg/dL (ref 0.44–1.00)
Creatinine, Ser: 0.41 mg/dL — ABNORMAL LOW (ref 0.44–1.00)
GFR, Estimated: >60 mL/min (ref >60)
GFR, Estimated: >60 mL/min (ref >60)
Glucose, Bld: 89 mg/dL (ref 70–99)
Glucose, Bld: 105 mg/dL — ABNORMAL HIGH (ref 70–99)
Potassium: 3.9 mmol/L (ref 3.5–5.1)
Potassium: 4.2 mmol/L (ref 3.5–5.1)
Sodium: 131 mmol/L — ABNORMAL LOW (ref 135–145)
Sodium: 131 mmol/L — ABNORMAL LOW (ref 135–145)

## 2022-09-05 LAB — CULTURE, BLOOD (ROUTINE X 2): Special Requests: ADEQUATE

## 2022-09-05 LAB — AEROBIC/ANAEROBIC CULTURE W GRAM STAIN (SURGICAL/DEEP WOUND)

## 2022-09-05 LAB — MAGNESIUM: Magnesium: 1.4 mg/dL — ABNORMAL LOW (ref 1.7–2.4)

## 2022-09-05 MED ORDER — SENNOSIDES-DOCUSATE SODIUM 8.6-50 MG PO TABS: 1.0000 | ORAL_TABLET | Freq: Two times a day (BID) | ORAL | 0 refills | Status: DC

## 2022-09-05 MED ORDER — TRAMADOL HCL 50 MG PO TABS: 50.0000 mg | ORAL_TABLET | ORAL | 0 refills | Status: DC | PRN

## 2022-09-05 MED ORDER — CELECOXIB 200 MG PO CAPS: 200.0000 mg | ORAL_CAPSULE | Freq: Two times a day (BID) | ORAL | 0 refills | Status: AC

## 2022-09-05 MED ORDER — OXYCODONE HCL 5 MG PO TABS: 5.0000 mg | ORAL_TABLET | ORAL | 0 refills | Status: DC | PRN

## 2022-09-05 MED ORDER — MAGNESIUM SULFATE 4 GM/100ML IV SOLN
4.0000 g | Freq: Once | INTRAVENOUS | Status: AC
  Administered 2022-09-05: 4 g via INTRAVENOUS
  Filled 2022-09-05: qty 100

## 2022-09-05 MED ORDER — ASPIRIN 81 MG PO CHEW: 81.0000 mg | CHEWABLE_TABLET | Freq: Two times a day (BID) | ORAL | 0 refills | Status: AC

## 2022-09-05 MED ORDER — ONDANSETRON HCL 4 MG PO TABS: 4.0000 mg | ORAL_TABLET | Freq: Four times a day (QID) | ORAL | 0 refills | Status: DC | PRN

## 2022-09-05 NOTE — Assessment & Plan Note (Signed)
s/p I&D, polyethylene and femoral head exchange, and placement of antibiotic impregnated stimulation beads ~06/12 --Monitor fever curve, CBC, procal --Follow cultures  --ID consulted appreciate input - will need 6 weeks of iv abx  --PICC line in place --Orthopedic surgery primary  --PT/OT

## 2022-09-05 NOTE — Plan of Care (Signed)
  Problem: Education: Goal: Knowledge of the prescribed therapeutic regimen will improve Outcome: Progressing Goal: Understanding of discharge needs will improve Outcome: Progressing   Problem: Activity: Goal: Ability to avoid complications of mobility impairment will improve Outcome: Progressing Goal: Ability to tolerate increased activity will improve Outcome: Progressing   Problem: Pain Management: Goal: Pain level will decrease with appropriate interventions Outcome: Progressing   Problem: Skin Integrity: Goal: Will show signs of wound healing Outcome: Progressing

## 2022-09-05 NOTE — Progress Notes (Addendum)
Subjective: 3 Days Post-Op Procedure(s) (LRB): IRRIGATION AND DEBRIDEMENT HIP WITH POLY EXCHANGE (Left) Patient reports pain as mild.   Patient is well, and has had no acute complaints or problems Denies any CP, SOB, ABD pain. We will continue therapy today.  Plan is to go Home after hospital stay.  Objective: Vital signs in last 24 hours: Temp:  [98.3 F (36.8 C)-100.3 F (37.9 C)] 98.5 F (36.9 C) (06/15 0830) Pulse Rate:  [86-103] 86 (06/15 0830) Resp:  [16-20] 18 (06/15 0830) BP: (116-149)/(70-76) 121/71 (06/15 0830) SpO2:  [94 %-100 %] 95 % (06/15 0830)  Intake/Output from previous day: 06/14 0701 - 06/15 0700 In: 490.3 [P.O.:440; I.V.:0.3; IV Piggyback:50] Out: 1290 [Urine:1250; Drains:40] Intake/Output this shift: No intake/output data recorded.  Recent Labs    09/03/22 0214 09/03/22 0843 09/03/22 1424 09/03/22 2111 09/04/22 0433  HGB 7.7* 8.9* 7.8* 9.4* 9.5*   Recent Labs    09/03/22 0843 09/03/22 1424 09/03/22 2111 09/04/22 0433  WBC 6.2  --   --  5.8  RBC 3.09*  --   --  3.30*  HCT 27.6*   < > 28.3* 28.5*  PLT 234  --   --  231   < > = values in this interval not displayed.   Recent Labs    09/03/22 0843 09/04/22 0433  NA 133* 134*  K 4.8 4.2  CL 104 102  CO2 21* 25  BUN 14 8  CREATININE 0.56 0.46  GLUCOSE 95 98  CALCIUM 8.0* 8.1*   Recent Labs    09/03/22 0843  INR 1.1    EXAM General - Patient is Alert, Appropriate, and Oriented Extremity - Neurovascular intact Sensation intact distally Intact pulses distally Dorsiflexion/Plantar flexion intact Dressing - dressing C/D/I and no drainage, Praveena intact with 100 cc of drainage in canister.  Hemovac removed Motor Function - intact, moving foot and toes well on exam.   Past Medical History:  Diagnosis Date   Abnormal EKG    Anemia    Arthritis    Depression    Fibromyalgia    GERD (gastroesophageal reflux disease)    History of hiatal hernia    Hypertension     Scoliosis    had trouble getting spinal in for TKA in 2022   Tachycardia     Assessment/Plan:   3 Days Post-Op Procedure(s) (LRB): IRRIGATION AND DEBRIDEMENT HIP WITH POLY EXCHANGE (Left) Principal Problem:   Prosthetic joint infection of left hip (HCC) Active Problems:   Hyperkalemia   Acute blood loss as cause of postoperative anemia   Shock circulatory (HCC)  Estimated body mass index is 25.95 kg/m as calculated from the following:   Height as of this encounter: 5' 3.5" (1.613 m).   Weight as of this encounter: 67.5 kg. Advance diet Up with therapy, weightbearing as tolerated  Pain well-controlled  Positive bowel movement  PICC line in place, continue with IV antibiotics per infectious disease.  Cultures show staph, susceptibilities pending  Plan is for patient to discharge home with home health PT   Patient will need 2-week follow-up with [facility]opedics She will continue with aspirin 81 mg twice daily Prevena negative pressure dressing to be removed 7 days after discharge date and honeycomb dressing applied    DVT Prophylaxis - Aspirin, TED hose, and SCDs Weight-Bearing as tolerated to left leg   T. Cranston Neighbor, PA-C [facility] 09/05/2022, 9:20 AM   Patient seen and examined, agree with above plan.  The patient  is doing well status post left hip irrigation and debridement with poly exchange, no concerns at this time.  Pain is controlled. Discussed antibiotics, dvt prophylaxis, and continued therapy.  All questions answered the patient agrees with above plan will go home.  Reinaldo Berber MD

## 2022-09-05 NOTE — Progress Notes (Addendum)
Physical Therapy Treatment Patient Details Name: Gina Matthews MRN: [id number] DOB: 21-Dec-1950 Today's Date: 09/05/2022   History of Present Illness 72 y/o female s/p L hip I&D with poly and femoral head exchange on 09/02/22. Admitted after procedure for hypotension 2/2 acute blood loss anemia. PMH: L THA on 04/15/22, HTN, fibromyalgia    PT Comments    Pt was long sitting in bed upon arriving. She is A an agreeable. Able to recall 2/3 hip precautions. Needed vcs only with transfers to maintain precautions. Overall pt was able to exit bed, stand to rollator, and tolerate ambulation ~ 120 ft with rollator. No LOB however, distance limited by fatigue. She did perform ascending/descending stairs to simulate home entry. No difficulty noted. Pt is progressing well. Reviewed importance of performing HEP. Pt states understanding. Will continue to follow per current POC.    Recommendations for follow up therapy are one component of a multi-disciplinary discharge planning process, led by the attending physician.  Recommendations may be updated based on patient status, additional functional criteria and insurance authorization.     Assistance Recommended at Discharge Set up Supervision/Assistance  Patient can return home with the following A little help with walking and/or transfers;A little help with bathing/dressing/bathroom;Assistance with cooking/housework;Assist for transportation;Help with stairs or ramp for entrance   Equipment Recommendations  None recommended by PT       Precautions / Restrictions Precautions Precautions: Posterior Hip;Fall Precaution Booklet Issued: Yes (comment) Precaution Comments: wound vac Restrictions Weight Bearing Restrictions: Yes RLE Weight Bearing: Weight bearing as tolerated     Mobility  Bed Mobility Overal bed mobility: Modified Independent     Transfers Overall transfer level: Modified independent Equipment used: Rollator (4 wheels) Transfers: Sit  to/from Stand Sit to Stand: Modified independent (Device/Increase time)  General transfer comment: no physical assistance or vcs to transfer. reviewed hip precautions with pt upon standing due to flexion/bending restrictions    Ambulation/Gait Ambulation/Gait assistance: Supervision Gait Distance (Feet): 140 Feet Assistive device: Rollator (4 wheels) Gait Pattern/deviations: Step-through pattern, Decreased stride length, Decreased stance time - left Gait velocity: decreased  General Gait Details: pt demonstarted safe steady gait ~ 140 ft with rollator prior to requesting seated rest. no LOB however poor gait posture noted. per pt, posture is poor at baseline.   Stairs Stairs: Yes Stairs assistance: Min guard Stair Management: One rail Right, Step to pattern Number of Stairs: 4 General stair comments: Pt was able to safely ascend/descend 4 stair with BUE support on R rail (ascending). step to pattern performed. no LOB or safety concern during stair performance.     Balance Overall balance assessment: Needs assistance Sitting-balance support: Feet supported Sitting balance-Leahy Scale: Normal  Standing balance support: Bilateral upper extremity supported, During functional activity Standing balance-Leahy Scale: Fair Standing balance comment: no LOB noted during session       Cognition Arousal/Alertness: Awake/alert Behavior During Therapy: WFL for tasks assessed/performed Overall Cognitive Status: Within Functional Limits for tasks assessed    General Comments: Pt is A and O x4 and agreeable to session. Discussed baseline abilities and home situation. only able to recall 2 of 3 hip precautions           General Comments General comments (skin integrity, edema, etc.): Reviewed importance of performing ther ex throughout the day to promote strengthening and improve safety functional abilities. she tates understanding however will continue to need encouragement in future  sessions.      Pertinent Vitals/Pain Pain Assessment Pain Assessment: No/denies pain Pain  Score: 0-No pain     PT Goals (current goals can now be found in the care plan section) Acute Rehab PT Goals Patient Stated Goal: to go home Progress towards PT goals: Progressing toward goals    Frequency    7X/week      PT Plan Current plan remains appropriate       AM-PAC PT "6 Clicks" Mobility   Outcome Measure  Help needed turning from your back to your side while in a flat bed without using bedrails?: None Help needed moving from lying on your back to sitting on the side of a flat bed without using bedrails?: None Help needed moving to and from a bed to a chair (including a wheelchair)?: A Little Help needed standing up from a chair using your arms (e.g., wheelchair or bedside chair)?: A Little Help needed to walk in hospital room?: A Little Help needed climbing 3-5 steps with a railing? : A Little 6 Click Score: 20    End of Session Equipment Utilized During Treatment: Gait belt Activity Tolerance: Patient tolerated treatment well Patient left: in chair;with call bell/phone within reach Nurse Communication: Mobility status PT Visit Diagnosis: Unsteadiness on feet (R26.81);Muscle weakness (generalized) (M62.81);Other abnormalities of gait and mobility (R26.89)     Time: 1610-9604 PT Time Calculation (min) (ACUTE ONLY): 25 min  Charges:  $Gait Training: 23-37 mins                    Jetta Lout PTA 09/05/22, 11:43 AM

## 2022-09-05 NOTE — Plan of Care (Signed)

## 2022-09-05 NOTE — Hospital Course (Signed)
HPI: "72 yo female who previously underwent a left total hip arthroplasty on 04/15/22.  Her postoperative course was complicated by a large hematoma of the left hip requiring surgical evacuation on 05/11/22 along with Prevana wound vac placement.  Pt subsequently discharge to a skilled nursing facility for rehab.  During outpatient ortho follow-up visit on 06/11/22 pt noted to have persistent left hip drainage with erythema for which she was prescribed bactrim with resolution of the erythema.  Superficial cultures were positive for MRSA wound care consulted and wound vac placed.  On 06/7 during ortho follow-up visit pt c/o tightness/discomfort at the left hip incision site.  A joint aspiration was performed for culture and pt instructed to continue bactrim.  Aspiration grain stain results demonstrated no organisms but many WBC's.  On 06/11 ortho recommended incision, irrigation and debridement of the left hip with possible need for deep exploration and placement of antibiotic-impregnated stimulation beads.     Pt presented to [facility] on 06/12 for left hip surgical procedure.  Pt underwent incision, irrigation, and debridement of the left hip, polyethylene and femoral head exchange, and placement of antibiotic impregnated stimulation beads.  Postop pt hypotensive secondary to acute blood loss anemia and required blood products and neo-synephrine gtt.  Pt also hyperkalemia.  She was admitted to the stepdown unit per orthopedic surgery.  Due to persistent hypotension requiring neo-synephrine gtt PCCM team consulted on 06/13 to assist with management. "  Pt was weaned of pressors on 09/04/22.   BP's remained stable.  TRH assumed care on 09/05/22. Patient transferred out of ICU to telemetry floor.

## 2022-09-05 NOTE — Assessment & Plan Note (Signed)
Iron deficiency anemia --Monitor CBC --Transfuse if Hbg < 8 or actively bleeding --Monitor wound vac output --DVT ppx per Ortho, primary - ASA, TED hose, SCD"s

## 2022-09-05 NOTE — Assessment & Plan Note (Signed)
Resolved.  Monitor BMP ?

## 2022-09-05 NOTE — Assessment & Plan Note (Addendum)
Refractory x 3 days despite aggressive IV replacement. Mg trend 1.8 >> 1.6 >> 1.4 >> 1.5 >> 1.7 >> 1.5 Suspect GI losses given loose frequent BM's with stool softeners, but etiology is unclear --Check 24 hour urine Mg -- collection to finish around noon today. Will follow results.   --Pt informed her PCP or HH needs to monitor Mg level closely --4 g IV Mg sulfate ordered again today --Discharge on PO Slo-Mag BID

## 2022-09-05 NOTE — Assessment & Plan Note (Signed)
Resolved.  BP stable off pressors. --Maintain MAP >65

## 2022-09-05 NOTE — Progress Notes (Signed)
Progress Note   Patient: Gina Matthews ZOX:[id number] DOB: June 11, 1950 DOA: 09/02/2022     3 DOS: the patient was seen and examined on 09/05/2022   Brief hospital course: HPI: "72 yo female who previously underwent a left total hip arthroplasty on 04/15/22.  Her postoperative course was complicated by a large hematoma of the left hip requiring surgical evacuation on 05/11/22 along with Prevana wound vac placement.  Pt subsequently discharge to a skilled nursing facility for rehab.  During outpatient ortho follow-up visit on 06/11/22 pt noted to have persistent left hip drainage with erythema for which she was prescribed bactrim with resolution of the erythema.  Superficial cultures were positive for MRSA wound care consulted and wound vac placed.  On 06/7 during ortho follow-up visit pt c/o tightness/discomfort at the left hip incision site.  A joint aspiration was performed for culture and pt instructed to continue bactrim.  Aspiration grain stain results demonstrated no organisms but many WBC's.  On 06/11 ortho recommended incision, irrigation and debridement of the left hip with possible need for deep exploration and placement of antibiotic-impregnated stimulation beads.     Pt presented to [facility] on 06/12 for left hip surgical procedure.  Pt underwent incision, irrigation, and debridement of the left hip, polyethylene and femoral head exchange, and placement of antibiotic impregnated stimulation beads.  Postop pt hypotensive secondary to acute blood loss anemia and required blood products and neo-synephrine gtt.  Pt also hyperkalemia.  She was admitted to the stepdown unit per orthopedic surgery.  Due to persistent hypotension requiring neo-synephrine gtt PCCM team consulted on 06/13 to assist with management. "  Pt was weaned of pressors on 09/04/22.   BP's remained stable.  TRH assumed care on 09/05/22. Patient transferred out of ICU to telemetry floor.  Assessment and Plan: * Prosthetic joint  infection of left hip (HCC) s/p I&D, polyethylene and femoral head exchange, and placement of antibiotic impregnated stimulation beads ~06/12 --Monitor fever curve, CBC, procal --Follow cultures  --ID consulted appreciate input - will need 6 weeks of iv abx  --PICC line in place --Orthopedic surgery primary  --PT/OT  Hypomagnesemia Mg 1.4 today, from 1.6 yesterday & despite replacement past two days. --4 g IV Mg sulfate ordered --Monitor Mg level & replace PRN  Shock circulatory (HCC) Resolved.  BP stable off pressors. --Maintain MAP >65  Acute blood loss as cause of postoperative anemia Iron deficiency anemia --Monitor CBC --Transfuse if Hbg < 8 or actively bleeding --Monitor wound vac output --DVT ppx per Ortho, primary - ASA, TED hose, SCD"s  Hyperkalemia Resolved. --Monitor BMP.        Subjective: Pt up in recliner this AM.  Reports overall feeling okay.  Pain fairly well controlled with meds. No fever/chills.    Physical Exam: Vitals:   09/04/22 1213 09/04/22 2016 09/04/22 2353 09/05/22 0830  BP: 125/76 (!) 149/70 116/70 121/71  Pulse:  (!) 103 95 86  Resp: 17 20 16 18   Temp: 98.3 F (36.8 C) 100.3 F (37.9 C) 98.6 F (37 C) 98.5 F (36.9 C)  TempSrc: Oral     SpO2: 100% 97% 94% 95%  Weight:      Height:       General exam: awake, alert, no acute distress HEENT: atraumatic, clear conjunctiva, anicteric sclera, moist mucus membranes, hearing grossly normal  Respiratory system: CTAB, no wheezes, rales or rhonchi, normal respiratory effort. Cardiovascular system: normal S1/S2, RRR, no pedal edema.   Gastrointestinal system: soft, NT, ND, no HSM felt, +  bowel sounds. Central nervous system: A&O x4. no gross focal neurologic deficits, normal speech Extremities: moves all, no edema, normal tone Skin: dry, intact, normal temperature Psychiatry: normal mood, congruent affect, judgement and insight appear normal   Data Reviewed:  Notable labs --- Na 131, Cl  97, glucose 105, Cr 0.41, Mg 1.6 >> 1.4, Hbg improved 9.5  Family Communication: None present. Will attempt to call  Disposition: Status is: Inpatient Discharge will be per primary service, orthopedic surgery. Pt has electrolyte derangements requiring replacement and close monitoring.   Planned Discharge Destination: Home with Home Health    Time spent: 45  minutes  Author: Pennie Banter, DO 09/05/2022 4:16 PM  For on call review [email or web address].

## 2022-09-05 NOTE — Progress Notes (Signed)
PHARMACY CONSULT NOTE  Pharmacy Consult for Electrolyte Monitoring and Replacement   Recent Labs: Potassium (mmol/L)  Date Value  09/05/2022 3.9   Magnesium (mg/dL)  Date Value  16/12/9602 1.4 (L)   Calcium (mg/dL)  Date Value  54/11/8117 8.6 (L)   Albumin (g/dL)  Date Value  14/78/2956 2.9 (L)   Phosphorus (mg/dL)  Date Value  21/30/8657 2.8   Sodium (mmol/L)  Date Value  09/05/2022 131 (L)     Assessment: 72 yo female who previously underwent a left total hip arthroplasty on 04/15/22. Her postoperative course was complicated by a large hematoma of the left hip requiring surgical evacuation on 05/11/22 admitted w/ likely MDR cellulitis  Goal of Therapy:  Electrolytes WNL  Plan:  ---Mag 1.4  MD ordered 4 grams IV magnesium sulfate x 1 ---recheck electrolytes in am  Angelique Blonder ,PharmD Clinical Pharmacist 09/05/2022 11:21 AM

## 2022-09-05 NOTE — Plan of Care (Signed)
  Problem: Activity: Goal: Ability to avoid complications of mobility impairment will improve Outcome: Progressing Goal: Ability to tolerate increased activity will improve Outcome: Progressing   Problem: Skin Integrity: Goal: Will show signs of wound healing Outcome: Progressing   Problem: Pain Managment: Goal: General experience of comfort will improve Outcome: Progressing   Problem: Safety: Goal: Ability to remain free from injury will improve Outcome: Progressing   Problem: Skin Integrity: Goal: Risk for impaired skin integrity will decrease Outcome: Progressing

## 2022-09-06 DIAGNOSIS — D62 Acute posthemorrhagic anemia: Secondary | ICD-10-CM | POA: Diagnosis not present

## 2022-09-06 LAB — BASIC METABOLIC PANEL
Anion gap: 7 (ref 5–15)
BUN: 10 mg/dL (ref 8–23)
CO2: 27 mmol/L (ref 22–32)
Calcium: 8.7 mg/dL — ABNORMAL LOW (ref 8.9–10.3)
Chloride: 98 mmol/L (ref 98–111)
Creatinine, Ser: 0.43 mg/dL — ABNORMAL LOW (ref 0.44–1.00)
GFR, Estimated: >60 mL/min (ref >60)
Glucose, Bld: 98 mg/dL (ref 70–99)
Potassium: 4.1 mmol/L (ref 3.5–5.1)
Sodium: 132 mmol/L — ABNORMAL LOW (ref 135–145)

## 2022-09-06 LAB — CULTURE, BLOOD (ROUTINE X 2)

## 2022-09-06 LAB — MAGNESIUM
Magnesium: 1.5 mg/dL — ABNORMAL LOW (ref 1.7–2.4)
Magnesium: 1.7 mg/dL (ref 1.7–2.4)

## 2022-09-06 MED ORDER — MAGNESIUM SULFATE 4 GM/100ML IV SOLN
4.0000 g | Freq: Once | INTRAVENOUS | Status: AC
  Administered 2022-09-06: 4 g via INTRAVENOUS
  Filled 2022-09-06: qty 100

## 2022-09-06 NOTE — Progress Notes (Signed)
Physical Therapy Treatment Patient Details Name: Gina Matthews MRN: [id number] DOB: 15-Nov-1950 Today's Date: 09/06/2022   History of Present Illness 72 y/o female s/p L hip I&D with poly and femoral head exchange on 09/02/22. Admitted after procedure for hypotension 2/2 acute blood loss anemia. PMH: L THA on 04/15/22, HTN, fibromyalgia    PT Comments    OOB and completes x 1 lap on unit with RW and supervision.  Generally steady with gait.  Cues or general safety with waiting for writer to set up room and equipment for safe mobility.  Pt anticipates discharge home tomorrow and does not have any further questions or concerns at this time.   Recommendations for follow up therapy are one component of a multi-disciplinary discharge planning process, led by the attending physician.  Recommendations may be updated based on patient status, additional functional criteria and insurance authorization.  Follow Up Recommendations       Assistance Recommended at Discharge Set up Supervision/Assistance  Patient can return home with the following A little help with walking and/or transfers;A little help with bathing/dressing/bathroom;Assistance with cooking/housework;Assist for transportation;Help with stairs or ramp for entrance   Equipment Recommendations  None recommended by PT    Recommendations for Other Services       Precautions / Restrictions Precautions Precautions: Posterior Hip;Fall Precaution Comments: wound vac Restrictions Weight Bearing Restrictions: Yes RLE Weight Bearing: Weight bearing as tolerated     Mobility  Bed Mobility Overal bed mobility: Modified Independent               Patient Response: Cooperative, Impulsive  Transfers Overall transfer level: Modified independent Equipment used: Rollator (4 wheels) Transfers: Sit to/from Stand Sit to Stand: Modified independent (Device/Increase time)           General transfer comment: no physical assistance or  vcs to transfer. reviewed hip precautions with pt upon standing due to flexion/bending restrictions    Ambulation/Gait Ambulation/Gait assistance: Supervision Gait Distance (Feet): 200 Feet Assistive device: Rollator (4 wheels) Gait Pattern/deviations: Step-through pattern, Decreased stride length, Decreased stance time - left Gait velocity: decreased     General Gait Details: pt demonstarted safe steady gait ~ 140 ft with rollator prior to requesting seated rest. no LOB however poor gait posture noted. per pt, posture is poor at baseline.   Stairs         General stair comments: Stated she did steps yesterday and was comfortable with them   Wheelchair Mobility    Modified Rankin (Stroke Patients Only)       Balance Overall balance assessment: Needs assistance Sitting-balance support: Feet supported Sitting balance-Leahy Scale: Normal     Standing balance support: Bilateral upper extremity supported, During functional activity Standing balance-Leahy Scale: Fair Standing balance comment: no LOB noted during session                            Cognition Arousal/Alertness: Awake/alert Behavior During Therapy: WFL for tasks assessed/performed Overall Cognitive Status: Within Functional Limits for tasks assessed                                 General Comments: a bit impulsive with movements but overall does well.        Exercises      General Comments        Pertinent Vitals/Pain Pain Assessment Pain Assessment: No/denies pain Pain Intervention(s): Monitored during  session, Repositioned    Home Living                          Prior Function            PT Goals (current goals can now be found in the care plan section) Progress towards PT goals: Progressing toward goals    Frequency    7X/week      PT Plan Current plan remains appropriate    Co-evaluation              AM-PAC PT "6 Clicks" Mobility    Outcome Measure  Help needed turning from your back to your side while in a flat bed without using bedrails?: None Help needed moving from lying on your back to sitting on the side of a flat bed without using bedrails?: None Help needed moving to and from a bed to a chair (including a wheelchair)?: A Little Help needed standing up from a chair using your arms (e.g., wheelchair or bedside chair)?: A Little Help needed to walk in hospital room?: A Little Help needed climbing 3-5 steps with a railing? : A Little 6 Click Score: 20    End of Session Equipment Utilized During Treatment: Gait belt   Patient left: in bed;with call bell/phone within reach;with bed alarm set Nurse Communication: Mobility status PT Visit Diagnosis: Unsteadiness on feet (R26.81);Muscle weakness (generalized) (M62.81);Other abnormalities of gait and mobility (R26.89)     Time: 1610-9604 PT Time Calculation (min) (ACUTE ONLY): 11 min  Charges:  $Gait Training: 8-22 mins                   Danielle Dess, PTA 09/06/22, 2:57 PM

## 2022-09-06 NOTE — Progress Notes (Addendum)
Subjective: 4 Days Post-Op Procedure(s) (LRB): IRRIGATION AND DEBRIDEMENT HIP WITH POLY EXCHANGE (Left) Patient reports pain as mild.   Patient is well, and has had no acute complaints or problems Denies any CP, SOB, ABD pain. We will continue therapy today.  Plan is to go Home after hospital stay.  Objective: Vital signs in last 24 hours: Temp:  [98.2 F (36.8 C)-99.3 F (37.4 C)] 98.8 F (37.1 C) (06/16 0822) Pulse Rate:  [77-98] 77 (06/16 0822) Resp:  [16-20] 18 (06/16 0822) BP: (110-130)/(62-74) 126/65 (06/16 0822) SpO2:  [94 %-96 %] 96 % (06/16 0822)  Intake/Output from previous day: 06/15 0701 - 06/16 0700 In: 0.5 [IV Piggyback:0.5] Out: 160 [Drains:160] Intake/Output this shift: Total I/O In: 10 [I.V.:10] Out: -   Recent Labs    09/03/22 1424 09/03/22 2111 09/04/22 0433  HGB 7.8* 9.4* 9.5*   Recent Labs    09/03/22 2111 09/04/22 0433  WBC  --  5.8  RBC  --  3.30*  HCT 28.3* 28.5*  PLT  --  231   Recent Labs    09/05/22 1312 09/06/22 0550  NA 131* 132*  K 4.2 4.1  CL 97* 98  CO2 26 27  BUN 11 10  CREATININE 0.41* 0.43*  GLUCOSE 105* 98  CALCIUM 9.1 8.7*   No results for input(s): "LABPT", "INR" in the last 72 hours.   EXAM General - Patient is Alert, Appropriate, and Oriented Extremity - Neurovascular intact Sensation intact distally Intact pulses distally Dorsiflexion/Plantar flexion intact Dressing - dressing C/D/I and no drainage, Praveena intact with 100 cc of drainage in canister.   Motor Function - intact, moving foot and toes well on exam.   Past Medical History:  Diagnosis Date   Abnormal EKG    Anemia    Arthritis    Depression    Fibromyalgia    GERD (gastroesophageal reflux disease)    History of hiatal hernia    Hypertension    Scoliosis    had trouble getting spinal in for TKA in 2022   Tachycardia     Assessment/Plan:   4 Days Post-Op Procedure(s) (LRB): IRRIGATION AND DEBRIDEMENT HIP WITH POLY EXCHANGE  (Left) Principal Problem:   Prosthetic joint infection of left hip (HCC) Active Problems:   Hyperkalemia   Acute blood loss as cause of postoperative anemia   Shock circulatory (HCC)   Hypomagnesemia  Estimated body mass index is 25.95 kg/m as calculated from the following:   Height as of this encounter: 5' 3.5" (1.613 m).   Weight as of this encounter: 67.5 kg. Advance diet Up with therapy, weightbearing as tolerated  Pain well-controlled  PICC line in place, continue with IV antibiotics per infectious disease.  Cultures show MSSA. ID has made abx recommendations and orders placed  Plan is for patient to discharge home with home health PT/nursing. D/C home tomorrow. Patients daughter will be in town tomorrow afternoon to assist patient.  Patient will need 2-week follow-up with [facility] orthopedics She will continue with aspirin 81 mg twice daily Prevena negative pressure dressing to be removed 7 days after discharge date and honeycomb dressing applied. Patient has her own wound vac unit to hookup to provena dressing.    DVT Prophylaxis - Aspirin, TED hose, and SCDs Weight-Bearing as tolerated to left leg   T. Cranston Neighbor, PA-C [facility] 09/06/2022, 9:40 AM   Patient seen and examined, agree with above plan.  The patient is doing well status post left hip I&D  with poly exchange, no concerns at this time.  Pain is controlled.  Discussed DVT prophylaxis, pain medication use, and safe transition to home with antibiotics. All questions answered plan for DC home with HHPT, PICC in place.   Reinaldo Berber MD

## 2022-09-06 NOTE — Progress Notes (Signed)
PHARMACY CONSULT NOTE  Pharmacy Consult for Electrolyte Monitoring and Replacement   Recent Labs: Potassium (mmol/L)  Date Value  09/06/2022 4.1   Magnesium (mg/dL)  Date Value  16/12/9602 1.5 (L)   Calcium (mg/dL)  Date Value  54/11/8117 8.7 (L)   Albumin (g/dL)  Date Value  14/78/2956 2.9 (L)   Phosphorus (mg/dL)  Date Value  21/30/8657 2.8   Sodium (mmol/L)  Date Value  09/06/2022 132 (L)     Assessment: 72 yo female who previously underwent a left total hip arthroplasty on 04/15/22. Her postoperative course was complicated by a large hematoma of the left hip requiring surgical evacuation on 05/11/22 admitted w/ likely MDR cellulitis  Goal of Therapy:  Electrolytes WNL  Plan:  ---Mag 1.5   Scr 0.43  Will order magnesium sulfate 4 gm IV x 1 Will recheck Magnesium level @ 1800 ---recheck electrolytes in am  Angelique Blonder ,PharmD Clinical Pharmacist 09/06/2022 7:49 AM

## 2022-09-06 NOTE — Progress Notes (Signed)
PHARMACY CONSULT NOTE  Pharmacy Consult for Electrolyte Monitoring and Replacement   Recent Labs: Potassium (mmol/L)  Date Value  09/06/2022 4.1   Magnesium (mg/dL)  Date Value  19/14/7829 1.7   Calcium (mg/dL)  Date Value  56/21/3086 8.7 (L)   Albumin (g/dL)  Date Value  57/84/6962 2.9 (L)   Phosphorus (mg/dL)  Date Value  95/28/4132 2.8   Sodium (mmol/L)  Date Value  09/06/2022 132 (L)     Assessment: 72 yo female who previously underwent a left total hip arthroplasty on 04/15/22. Her postoperative course was complicated by a large hematoma of the left hip requiring surgical evacuation on 05/11/22 admitted w/ likely MDR cellulitis  Goal of Therapy:  Electrolytes WNL  Plan:  ---Mag 1.7   on recheck ---recheck electrolytes in am  Clovia Cuff, PharmD, BCPS 09/06/2022 8:04 PM

## 2022-09-06 NOTE — Plan of Care (Signed)

## 2022-09-06 NOTE — Plan of Care (Signed)

## 2022-09-06 NOTE — Progress Notes (Signed)
Progress Note   Patient: Gina Matthews ZOX:[id number] DOB: [birth year] DOA: 09/02/2022     4 DOS: the patient was seen and examined on 09/06/2022   Brief hospital course: HPI: "72 yo female who previously underwent a left total hip arthroplasty on 04/15/22.  Her postoperative course was complicated by a large hematoma of the left hip requiring surgical evacuation on 05/11/22 along with Prevana wound vac placement.  Pt subsequently discharge to a skilled nursing facility for rehab.  During outpatient ortho follow-up visit on 06/11/22 pt noted to have persistent left hip drainage with erythema for which she was prescribed bactrim with resolution of the erythema.  Superficial cultures were positive for MRSA wound care consulted and wound vac placed.  On 06/7 during ortho follow-up visit pt c/o tightness/discomfort at the left hip incision site.  A joint aspiration was performed for culture and pt instructed to continue bactrim.  Aspiration grain stain results demonstrated no organisms but many WBC's.  On 06/11 ortho recommended incision, irrigation and debridement of the left hip with possible need for deep exploration and placement of antibiotic-impregnated stimulation beads.     Pt presented to [facility] on 06/12 for left hip surgical procedure.  Pt underwent incision, irrigation, and debridement of the left hip, polyethylene and femoral head exchange, and placement of antibiotic impregnated stimulation beads.  Postop pt hypotensive secondary to acute blood loss anemia and required blood products and neo-synephrine gtt.  Pt also hyperkalemia.  She was admitted to the stepdown unit per orthopedic surgery.  Due to persistent hypotension requiring neo-synephrine gtt PCCM team consulted on 06/13 to assist with management. "  Pt was weaned of pressors on 09/04/22.   BP's remained stable.  TRH assumed care on 09/05/22. Patient transferred out of ICU to telemetry floor.  Assessment and Plan: * Prosthetic joint  infection of left hip (HCC) s/p I&D, polyethylene and femoral head exchange, and placement of antibiotic impregnated stimulation beads ~06/12 --Monitor fever curve, CBC, procal --Follow cultures  --ID consulted appreciate input - will need 6 weeks of iv abx  --PICC line in place --Orthopedic surgery primary  --PT/OT  Hypomagnesemia Refractory x 3 days despite aggressive IV replacement. Mg trend 1.8 >> 1.6 >> 1.4 >> 1.5 today Suspect GI losses given loose frequent BM's with stool softeners, but etiology is unclear --Check 24 hour urine Mg --4 g IV Mg sulfate ordered --Monitor Mg level & replace PRN  Shock circulatory (HCC) Resolved.  BP stable off pressors. --Maintain MAP >65  Acute blood loss as cause of postoperative anemia Iron deficiency anemia --Monitor CBC --Transfuse if Hbg < 8 or actively bleeding --Monitor wound vac output --DVT ppx per Ortho, primary - ASA, TED hose, SCD"s  Hyperkalemia Resolved. --Monitor BMP.        Subjective: Pt awake sitting up in bed.  Reports loose stools from stool softeners, began declining them due to frequent loose BM's.  Otherwise reports feeling well and hopes to go home tomorrow.    Physical Exam: Vitals:   09/05/22 0830 09/05/22 1743 09/05/22 2339 09/06/22 0822  BP: 121/71 130/74 110/62 126/65  Pulse: 86 89 98 77  Resp: 18 16 20 18   Temp: 98.5 F (36.9 C) 99.3 F (37.4 C) 98.2 F (36.8 C) 98.8 F (37.1 C)  TempSrc:      SpO2: 95% 96% 94% 96%  Weight:      Height:       General exam: awake, alert, no acute distress HEENT: moist mucus membranes, hearing grossly normal  Respiratory system: on room air, normal respiratory effort. Cardiovascular system: RRR, no pedal edema.   Central nervous system: A&O x4. no gross focal neurologic deficits, normal speech Extremities: moves all, no edema, normal tone Skin: dry, intact, normal temperature Psychiatry: normal mood, congruent affect, judgement and insight appear  normal   Data Reviewed:  Notable labs --- Na 132, Cr 0.43, Mg 1.6 >> 1.4 >> 1.5   Family Communication: None present. Will attempt to call  Disposition: Status is: Inpatient Discharge will be per primary service, orthopedic surgery. Pt has electrolyte derangements requiring replacement, further evaluation and close monitoring.   Planned Discharge Destination: Home with Home Health    Time spent: 38 minutes  Author: Pennie Banter, DO 09/06/2022 1:16 PM  For on call review [email or web address].

## 2022-09-07 DIAGNOSIS — A4902 Methicillin resistant Staphylococcus aureus infection, unspecified site: Secondary | ICD-10-CM | POA: Diagnosis not present

## 2022-09-07 DIAGNOSIS — D62 Acute posthemorrhagic anemia: Secondary | ICD-10-CM | POA: Diagnosis not present

## 2022-09-07 DIAGNOSIS — T8452XA Infection and inflammatory reaction due to internal left hip prosthesis, initial encounter: Secondary | ICD-10-CM | POA: Diagnosis not present

## 2022-09-07 LAB — AEROBIC/ANAEROBIC CULTURE W GRAM STAIN (SURGICAL/DEEP WOUND): Gram Stain: NONE SEEN

## 2022-09-07 LAB — BASIC METABOLIC PANEL
Anion gap: 8 (ref 5–15)
BUN: 10 mg/dL (ref 8–23)
CO2: 27 mmol/L (ref 22–32)
Calcium: 8.7 mg/dL — ABNORMAL LOW (ref 8.9–10.3)
Chloride: 99 mmol/L (ref 98–111)
Creatinine, Ser: 0.45 mg/dL (ref 0.44–1.00)
GFR, Estimated: >60 mL/min (ref >60)
Glucose, Bld: 97 mg/dL (ref 70–99)
Potassium: 3.9 mmol/L (ref 3.5–5.1)
Sodium: 134 mmol/L — ABNORMAL LOW (ref 135–145)

## 2022-09-07 LAB — CULTURE, BLOOD (ROUTINE X 2)
Culture: NO GROWTH
Culture: NO GROWTH
Special Requests: ADEQUATE

## 2022-09-07 LAB — HEPATIC FUNCTION PANEL
ALT: 13 U/L (ref 0–44)
AST: 18 U/L (ref 15–41)
Albumin: 2.9 g/dL — ABNORMAL LOW (ref 3.5–5.0)
Alkaline Phosphatase: 76 U/L (ref 38–126)
Bilirubin, Direct: 0.1 mg/dL (ref 0.0–0.2)
Indirect Bilirubin: 0.5 mg/dL (ref 0.3–0.9)
Total Bilirubin: 0.6 mg/dL (ref 0.3–1.2)
Total Protein: 5.6 g/dL — ABNORMAL LOW (ref 6.5–8.1)

## 2022-09-07 LAB — CK: Total CK: 20 U/L — ABNORMAL LOW (ref 38–234)

## 2022-09-07 LAB — MAGNESIUM: Magnesium: 1.5 mg/dL — ABNORMAL LOW (ref 1.7–2.4)

## 2022-09-07 MED ORDER — DAPTOMYCIN IV (FOR PTA / DISCHARGE USE ONLY)
500.0000 mg | INTRAVENOUS | 0 refills | Status: AC
Start: 2022-09-07 — End: 2022-10-17

## 2022-09-07 MED ORDER — SLOW-MAG 71.5-119 MG PO TBEC: 1.0000 | DELAYED_RELEASE_TABLET | Freq: Two times a day (BID) | ORAL | 0 refills | Status: AC

## 2022-09-07 MED ORDER — RIFAMPIN 300 MG PO CAPS: 300.0000 mg | ORAL_CAPSULE | Freq: Two times a day (BID) | ORAL | 0 refills | Status: DC

## 2022-09-07 MED ORDER — MAGNESIUM SULFATE 4 GM/100ML IV SOLN
4.0000 g | Freq: Once | INTRAVENOUS | Status: AC
  Administered 2022-09-07: 4 g via INTRAVENOUS
  Filled 2022-09-07: qty 100

## 2022-09-07 NOTE — Progress Notes (Signed)
Physical Therapy Treatment Patient Details Name: Gina Matthews MRN: [id number] DOB: 1950-12-13 Today's Date: 09/07/2022   History of Present Illness 72 y/o female s/p L hip I&D with poly and femoral head exchange on 09/02/22. Admitted after procedure for hypotension 2/2 acute blood loss anemia. PMH: L THA on 04/15/22, HTN, fibromyalgia    PT Comments    OOB and completes x 1 lap on unit with rollator.  Declined stair review - feels comfortable.  Awaiting discharge this afternoon to home and has no further questions or concerns voiced.  Recommendations for follow up therapy are one component of a multi-disciplinary discharge planning process, led by the attending physician.  Recommendations may be updated based on patient status, additional functional criteria and insurance authorization.  Follow Up Recommendations       Assistance Recommended at Discharge Set up Supervision/Assistance  Patient can return home with the following A little help with walking and/or transfers;A little help with bathing/dressing/bathroom;Assistance with cooking/housework;Assist for transportation;Help with stairs or ramp for entrance   Equipment Recommendations  None recommended by PT    Recommendations for Other Services       Precautions / Restrictions Precautions Precautions: Posterior Hip;Fall Precaution Comments: wound vac Restrictions Weight Bearing Restrictions: Yes RLE Weight Bearing: Weight bearing as tolerated     Mobility  Bed Mobility Overal bed mobility: Modified Independent               Patient Response: Cooperative  Transfers Overall transfer level: Modified independent Equipment used: Rollator (4 wheels) Transfers: Sit to/from Stand Sit to Stand: Modified independent (Device/Increase time)                Ambulation/Gait Ambulation/Gait assistance: Supervision, Modified independent (Device/Increase time) Gait Distance (Feet): 200 Feet Assistive device: Rollator  (4 wheels) Gait Pattern/deviations: Step-through pattern, Decreased stride length, Decreased stance time - left Gait velocity: decreased     General Gait Details: generally steady and safe with rollator   Stairs         General stair comments: declined review - feels comfortable   Wheelchair Mobility    Modified Rankin (Stroke Patients Only)       Balance Overall balance assessment: Needs assistance Sitting-balance support: Feet supported Sitting balance-Leahy Scale: Normal     Standing balance support: Bilateral upper extremity supported, During functional activity Standing balance-Leahy Scale: Fair Standing balance comment: no LOB noted during session                            Cognition Arousal/Alertness: Awake/alert Behavior During Therapy: WFL for tasks assessed/performed Overall Cognitive Status: Within Functional Limits for tasks assessed                                 General Comments: a bit impulsive with movements but overall does well.        Exercises      General Comments        Pertinent Vitals/Pain Pain Assessment Pain Assessment: No/denies pain    Home Living                          Prior Function            PT Goals (current goals can now be found in the care plan section) Progress towards PT goals: Progressing toward goals    Frequency  7X/week      PT Plan Current plan remains appropriate    Co-evaluation              AM-PAC PT "6 Clicks" Mobility   Outcome Measure  Help needed turning from your back to your side while in a flat bed without using bedrails?: None Help needed moving from lying on your back to sitting on the side of a flat bed without using bedrails?: None Help needed moving to and from a bed to a chair (including a wheelchair)?: None Help needed standing up from a chair using your arms (e.g., wheelchair or bedside chair)?: None Help needed to walk in  hospital room?: A Little Help needed climbing 3-5 steps with a railing? : A Little 6 Click Score: 22    End of Session Equipment Utilized During Treatment: Gait belt Activity Tolerance: Patient tolerated treatment well Patient left: in bed;with call bell/phone within reach;with bed alarm set Nurse Communication: Mobility status PT Visit Diagnosis: Unsteadiness on feet (R26.81);Muscle weakness (generalized) (M62.81);Other abnormalities of gait and mobility (R26.89)     Time: 1212-1220 PT Time Calculation (min) (ACUTE ONLY): 8 min  Charges:  $Gait Training: 8-22 mins                   Danielle Dess, PTA 09/07/22, 12:23 PM

## 2022-09-07 NOTE — Care Management Important Message (Signed)
Important Message  Patient Details  Name: Gina Matthews MRN: [id number] Date of Birth: 29-Jun-1950   Medicare Important Message Given:  N/A - LOS <3 / Initial given by admissions     Olegario Messier A Frans Valente 09/07/2022, 11:05 AM

## 2022-09-07 NOTE — TOC Progression Note (Signed)
Transition of Care [facility]) - Progression Note    Patient Details  Name: Gina Matthews MRN: [id number] Date of Birth: 09-27-1950  [facility] of [facility] CM/SW Contact  Marlowe Sax, RN Phone Number: 09/07/2022, 8:49 AM  Clinical Narrative:    Patient had IV ABX teaching on Friday by Julianne Rice, Plan to go home today after todays dose, will start tomorrow at home [facility] set up for Home health, Nana Cower made aware of DC order   Expected Discharge Plan: Home w Home Health Services Barriers to Discharge: Continued Medical Work up  Expected Discharge Plan and Services         Expected Discharge Date: 09/07/22                                     Social Determinants of Health (SDOH) Interventions SDOH Screenings   Food Insecurity: No Food Insecurity (09/03/2022)  Housing: Low Risk  (09/03/2022)  Transportation Needs: No Transportation Needs (09/03/2022)  Utilities: Not At Risk (09/03/2022)  Tobacco Use: Low Risk  (09/03/2022)    Readmission Risk Interventions     No data to display

## 2022-09-07 NOTE — Progress Notes (Signed)
Progress Note   Patient: Gina Matthews ZOX:[id number] DOB: [birth year] DOA: 09/02/2022     5 DOS: the patient was seen and examined on 09/07/2022   Brief hospital course: HPI: "72 yo female who previously underwent a left total hip arthroplasty on 04/15/22.  Her postoperative course was complicated by a large hematoma of the left hip requiring surgical evacuation on 05/11/22 along with Prevana wound vac placement.  Pt subsequently discharge to a skilled nursing facility for rehab.  During outpatient ortho follow-up visit on 06/11/22 pt noted to have persistent left hip drainage with erythema for which she was prescribed bactrim with resolution of the erythema.  Superficial cultures were positive for MRSA wound care consulted and wound vac placed.  On 06/7 during ortho follow-up visit pt c/o tightness/discomfort at the left hip incision site.  A joint aspiration was performed for culture and pt instructed to continue bactrim.  Aspiration grain stain results demonstrated no organisms but many WBC's.  On 06/11 ortho recommended incision, irrigation and debridement of the left hip with possible need for deep exploration and placement of antibiotic-impregnated stimulation beads.     Pt [facility] on [facility]/12 for left hip surgical procedure.  Pt underwent incision, irrigation, and debridement of the left hip, polyethylene and femoral head exchange, and placement of antibiotic impregnated stimulation beads.  Postop pt hypotensive secondary to acute blood loss anemia and required blood products and neo-synephrine gtt.  Pt also hyperkalemia.  She was admitted to the stepdown unit per orthopedic surgery.  Due to persistent hypotension requiring neo-synephrine gtt PCCM team consulted on 06/13 to assist with management. "  Pt was weaned of pressors on 09/04/22.   BP's remained stable.  TRH assumed care on 09/05/22. Patient transferred out of ICU to telemetry floor.  6/17 -- Pt being discharge home with [facility] today  per primary, Orthopedic surgery  Assessment and Plan: * Prosthetic joint infection of left hip (HCC) s/p I&D, polyethylene and femoral head exchange, and placement of antibiotic impregnated stimulation beads ~06/12 --Monitor fever curve, CBC, procal --Follow cultures  --ID consulted appreciate input - will need 6 weeks of iv abx  --PICC line in place --Orthopedic surgery primary  --PT/OT  Hypomagnesemia Refractory x 3 days despite aggressive IV replacement. Mg trend 1.8 >> 1.6 >> 1.4 >> 1.5 >> 1.7 >> 1.5 Suspect GI losses given loose frequent BM's with stool softeners, but etiology is unclear --Check 24 hour urine Mg -- collection to finish around noon today. Will follow results.   --Pt informed her PCP or HH needs to monitor Mg level closely --4 g IV Mg sulfate ordered again today --Discharge on PO Slo-Mag BID  Shock circulatory (HCC) Resolved.  BP stable off pressors. --Maintain MAP >65  Acute blood loss as cause of postoperative anemia Iron deficiency anemia --Monitor CBC --Transfuse if Hbg < 8 or actively bleeding --Monitor wound vac output --DVT ppx per Ortho, primary - ASA, TED hose, SCD"s  Hyperkalemia Resolved. --Monitor BMP.        Subjective: Pt awake sitting up in bed.  Reports feeling well and ready to go home today.  No acute complaints.   Physical Exam: Vitals:   09/06/22 1605 09/06/22 2056 09/07/22 0428 09/07/22 0824  BP: 133/76 126/64 118/80 133/77  Pulse: 85 81 80 81  Resp: 18 20 20 16   Temp: 98.6 F (37 C) 99.1 F (37.3 C) 98.6 F (37 C) 98.2 F (36.8 C)  TempSrc:      SpO2: 98% 97% 97%  99%  Weight:      Height:       General exam: awake, alert, no acute distress HEENT: moist mucus membranes, hearing grossly normal  Respiratory system: on room air, normal respiratory effort. Cardiovascular system: RRR, no pedal edema.   Central nervous system: A&O x4. no gross focal neurologic deficits, normal speech Extremities: moves all, no edema,  normal tone Skin: dry, intact, normal temperature Psychiatry: normal mood, congruent affect, judgement and insight appear normal   Data Reviewed:  Notable labs --- Na 134, Mg 1.6 >> 1.4 >> 1.5  >> 1.7 >> 1.5  Family Communication: None present. Will attempt to call  Disposition: Status is: Inpatient Discharge today per primary service, orthopedic surgery.   Planned Discharge Destination: Home with Home Health    Time spent: 38 minutes  Author: Pennie Banter, DO 09/07/2022 12:02 PM  For on call review [email or web address].

## 2022-09-07 NOTE — Plan of Care (Signed)

## 2022-09-07 NOTE — Progress Notes (Signed)
Date of Admission:  09/02/2022      ID: Gina Matthews is a 72 y.o. female Principal Problem:   Prosthetic joint infection of left hip (HCC) Active Problems:   Hyperkalemia   Acute blood loss as cause of postoperative anemia   Shock circulatory (HCC)   Hypomagnesemia    Subjective: Pt feeling much better Has been ambulating More energy Eating better  Medications:   sodium chloride   Intravenous Once   ascorbic acid  1,000 mg Oral Daily   aspirin  81 mg Oral BID   Chlorhexidine Gluconate Cloth  6 each Topical Daily   DULoxetine  60 mg Oral Daily   feeding supplement  237 mL Oral BID BM   ferrous sulfate  325 mg Oral BID WC   gabapentin  1,200 mg Oral BID   And   gabapentin  600 mg Oral Q1500   loratadine  10 mg Oral Daily   magnesium hydroxide  30 mL Oral Daily   multivitamin with minerals  1 tablet Oral Daily   pantoprazole  40 mg Oral BID   rifampin  300 mg Oral Q12H   senna-docusate  1 tablet Oral BID   sodium chloride flush  10-40 mL Intracatheter Q12H   traZODone  50 mg Oral QHS    Objective: Vital signs in last 24 hours: Patient Vitals for the past 24 hrs:  BP Temp Pulse Resp SpO2  09/07/22 0824 133/77 98.2 F (36.8 C) 81 16 99 %  09/07/22 0428 118/80 98.6 F (37 C) 80 20 97 %  09/06/22 2056 126/64 99.1 F (37.3 C) 81 20 97 %      PHYSICAL EXAM:  General: Alert, cooperative, no distress, appears stated age.  Lungs: few basal crepts Heart: Tachycardia. Abdomen: Soft, non-tender,not distended. Bowel sounds normal. No masses Extremities: left hip- vac covering surgical site Skin: No rashes or lesions. Or bruising Lymph: Cervical, supraclavicular normal. Neurologic: Grossly non-focal  Lab Results    Latest Ref Rng & Units 09/04/2022    4:33 AM 09/03/2022    9:11 PM 09/03/2022    2:24 PM  CBC  WBC 4.0 - 10.5 K/uL 5.8     Hemoglobin 12.0 - 15.0 g/dL 9.5  9.4  7.8   Hematocrit 36.0 - 46.0 % 28.5  28.3  23.9   Platelets 150 - 400 K/uL 231           Latest Ref Rng & Units 09/07/2022    4:50 AM 09/06/2022    5:50 AM 09/05/2022    1:12 PM  CMP  Glucose 70 - 99 mg/dL 97  98  161   BUN 8 - 23 mg/dL 10  10  11    Creatinine 0.44 - 1.00 mg/dL 0.96  0.45  4.09   Sodium 135 - 145 mmol/L 134  132  131   Potassium 3.5 - 5.1 mmol/L 3.9  4.1  4.2   Chloride 98 - 111 mmol/L 99  98  97   CO2 22 - 32 mmol/L 27  27  26    Calcium 8.9 - 10.3 mg/dL 8.7  8.7  9.1   Total Protein 6.5 - 8.1 g/dL 5.6     Total Bilirubin 0.3 - 1.2 mg/dL 0.6     Alkaline Phos 38 - 126 U/L 76     AST 15 - 41 U/L 18     ALT 0 - 44 U/L 13         Microbiology: BC- NG so far 3  Wound /tissue culture MRSA  Assessment/Plan: Left hip PJI- underwent DAIR MRSA in culture Currently on Daptomycin and rifampin  Will need for 6 weeks followed by PO antibiotics  Acute blood anemia post surgery with hypotension Received PRBC x 3 - was on pressor for a short period Hb stable  Pt has OPAT orders Will follow her as OP Discussed the management with the patient in detail including ,monitoring of labs while on antibiotics, side effects of antibiotics , recognizing the SE  and contacting me if needed

## 2022-09-07 NOTE — Discharge Summary (Signed)
Physician Discharge Summary  Subjective: 5 Days Post-Op Procedure(s) (LRB): IRRIGATION AND DEBRIDEMENT HIP WITH POLY EXCHANGE (Left) Patient reports pain as mild.   Patient seen in rounds with Dr. Ernest Pine. Patient is well, and has had no acute complaints or problems Patient is ready to go home with home health physical therapy and PICC line therapy.  Physician Discharge Summary  Patient ID: Gina Matthews MRN: [id number] DOB/AGE: [birth year] 72 y.o.  Admit date: 09/02/2022 Discharge date: 09/07/2022  Admission Diagnoses:  Discharge Diagnoses:  Principal Problem:   Prosthetic joint infection of left hip (HCC) Active Problems:   Hyperkalemia   Acute blood loss as cause of postoperative anemia   Shock circulatory (HCC)   Hypomagnesemia   Discharged Condition: fair  Hospital Course: The patient is postop day 5 from an irrigation and left hip polyethylene exchange.  The patient had a PICC line inserted on postop day 1 from surgery to receive IV antibiotic therapy.  She has been followed by infectious disease.  She is currently on rifampin and daptomycin after initially receiving ceftriaxone postoperatively.  She will be receiving 6 weeks of IV antibiotic therapy with daptomycin and oral rifampin.  She has been ambulating with physical therapy and is ambulating now 200 feet with good strength and stability using her walker.  She has a wound VAC in place that is now taking 50 cc over 12-hour shifts.  She is ready go home with home health physical therapy and home health nursing for PICC line treatment.  Treatments: antibiotics: Daptomycin and rifampin  surgery:   Incision, irrigation, and debridement of the left hip, polyethylene and femoral head exchange, and placement of antibiotic impregnated Stimulan beads   SURGEON:  Jena Gauss., M.D.           ASSISTANT: Gean Birchwood, PA-C   ANESTHESIA: general   ESTIMATED BLOOD LOSS: 400 mL   FLUIDS REPLACED: 2000 mL of crystalloid    250 ml albumin   DRAINS: 2 medium Hemovac drains, Prevena wound VAC   IMPLANTS UTILIZED: Depuy +4 mm 10 degree Marathon polyethylene, 36 mm M-SPEC +1.5 femoral head  Discharge Exam: Blood pressure 118/80, pulse 80, temperature 98.6 F (37 C), resp. rate 20, height 5' 3.5" (1.613 m), weight 67.5 kg, SpO2 97 %.   Disposition: Discharge disposition: 01-Home or Self Care       Discharge Instructions     [facility] pharmacist to adjust dose for Vancomycin, Aminoglycosides and other anti-infective therapies as requested by physician.   Complete by: As directed    [facility] to provide Cath Flo 2mg    Complete by: As directed    Administer for PICC line occlusion and as ordered by physician for other access device issues.   Anaphylaxis Kit: Provided to treat any anaphylactic reaction to the medication being provided to the patient if First Dose or when requested by physician   Complete by: As directed    Epinephrine 1mg /ml vial / amp: Administer 0.3mg  (0.24ml) subcutaneously once for moderate to severe anaphylaxis, nurse to call physician and pharmacy when reaction occurs and call 911 if needed for immediate care   Diphenhydramine 50mg /ml IV vial: Administer 25-50mg  IV/IM PRN for first dose reaction, rash, itching, mild reaction, nurse to call physician and pharmacy when reaction occurs   Sodium Chloride 0.9% NS IV: Administer if needed for hypovolemic blood pressure drop or as ordered by physician after call to physician with anaphylactic reaction   Change dressing on IV access line weekly  and PRN   Complete by: As directed    Flush IV access with Sodium Chloride 0.9% and Heparin 10 units/ml or 100 units/ml   Complete by: As directed    Home infusion instructions - [facility]   Complete by: As directed    Instructions: Flush IV access with Sodium Chloride 0.9% and Heparin 10units/ml or 100units/ml   Change dressing on IV access line: Weekly and  PRN   Instructions Cath Flo 2mg : Administer for PICC Line occlusion and as ordered by physician for other access device   [facility] pharmacist to adjust dose for: Vancomycin, Aminoglycosides and other anti-infective therapies as requested by physician   Method of administration may be changed at the discretion of home infusion pharmacist based upon assessment of the patient and/or caregiver's ability to self-administer the medication ordered   Complete by: As directed       Allergies as of 09/07/2022   No Known Allergies      Medication List     STOP taking these medications    SLOW FE PO       TAKE these medications    acetaminophen 500 MG tablet Commonly known as: TYLENOL Take 1,000 mg by mouth 2 (two) times daily.   alendronate 70 MG tablet Commonly known as: FOSAMAX Take 70 mg by mouth once a week. Take with a full glass of water on an empty stomach. Wednesdays   amLODipine 5 MG tablet Commonly known as: NORVASC Take 5 mg by mouth every evening.   aspirin 81 MG chewable tablet Chew 1 tablet (81 mg total) by mouth 2 (two) times daily.   calcium-vitamin D 500-5 MG-MCG tablet Commonly known as: OSCAL WITH D Take 1 tablet by mouth daily with breakfast.   carvedilol 6.25 MG tablet Commonly known as: COREG Take 6.25 mg by mouth 2 (two) times daily.   celecoxib 200 MG capsule Commonly known as: CELEBREX Take 1 capsule (200 mg total) by mouth 2 (two) times daily for 14 days.   cetirizine 10 MG tablet Commonly known as: ZYRTEC Take 10 mg by mouth daily as needed for allergies.   daptomycin  IVPB Commonly known as: CUBICIN Inject 500 mg into the vein daily. 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 Fax lab results promptly to [phone].  Method of administration: IV Push (Please pull PICC at completion of IV antibiotics) Method of  administration may be changed at the discretion of home infusion pharmacist based upon assessment of the patient and/or caregiver's ability to self-administer the medication ordered.   DULoxetine HCl 60 MG Csdr Take 60 mg by mouth every morning.   gabapentin 600 MG tablet Commonly known as: NEURONTIN Take 600-1,200 mg by mouth See admin instructions. Take 1200 mg in the morning and 600 mg noon and 1200 mg at bedtime   lisinopril 40 MG tablet Commonly known as: ZESTRIL Take 40 mg by mouth every morning.   multivitamin with minerals tablet Take 1 tablet by mouth daily. Senior   ondansetron 4 MG tablet Commonly known as: ZOFRAN Take 1 tablet (4 mg total) by mouth every 6 (six) hours as needed for nausea.   oxyCODONE 5 MG immediate release tablet Commonly known as: Oxy IR/ROXICODONE Take 1 tablet (5 mg total) by mouth every 4 (four) hours as needed for moderate pain (pain score 4-6).  pantoprazole 40 MG tablet Commonly known as: PROTONIX Take 40 mg by mouth 2 (two) times daily.   rifampin 300 MG capsule Commonly known as: Rifadin Take 1 capsule (300 mg total) by mouth 2 (two) times daily. First dose starting 09/05/22   senna-docusate 8.6-50 MG tablet Commonly known as: Senokot-S Take 1 tablet by mouth 2 (two) times daily.   traMADol 50 MG tablet Commonly known as: ULTRAM Take 1-2 tablets (50-100 mg total) by mouth every 4 (four) hours as needed for moderate pain.   traZODone 50 MG tablet Commonly known as: DESYREL Take 50 mg by mouth at bedtime.               Durable Medical Equipment  (From admission, onward)           Start     Ordered   09/02/22 2349  DME Walker rolling  Once       Question:  Patient needs a walker to treat with the following condition  Answer:  S/P total hip arthroplasty   09/02/22 2348   09/02/22 2349  DME Bedside commode  Once       Comments: Patient is not able to walk the distance required to go the bathroom, or he/she is unable to  safely negotiate stairs required to access the bathroom.  A 3in1 BSC will alleviate this problem  Question:  Patient needs a bedside commode to treat with the following condition  Answer:  S/P total hip arthroplasty   09/02/22 2348              Discharge Care Instructions  (From admission, onward)           Start     Ordered   09/07/22 0000  Change dressing on IV access line weekly and PRN  (Home infusion instructions - [facility] )        09/07/22 0705            Follow-up Information     Hooten, Illene Labrador, MD Follow up on 09/22/2022.   Specialty: Orthopedic Surgery Why: at 1:45pm Contact information: [address] [facility] [place] [address] [phone]                 Signed: Lenard Forth, Oddie Kuhlmann 09/07/2022, 7:05 AM   Objective: Vital signs in last 24 hours: Temp:  [98.6 F (37 C)-99.1 F (37.3 C)] 98.6 F (37 C) (06/17 0428) Pulse Rate:  [77-85] 80 (06/17 0428) Resp:  [18-20] 20 (06/17 0428) BP: (118-133)/(64-80) 118/80 (06/17 0428) SpO2:  [96 %-98 %] 97 % (06/17 0428)  Intake/Output from previous day:  Intake/Output Summary (Last 24 hours) at 09/07/2022 0705 Last data filed at 09/07/2022 0430 Gross per 24 hour  Intake 10 ml  Output 750 ml  Net -740 ml    Intake/Output this shift: No intake/output data recorded.  Labs: No results for input(s): "HGB" in the last 72 hours. No results for input(s): "WBC", "RBC", "HCT", "PLT" in the last 72 hours. Recent Labs    09/06/22 0550 09/07/22 0450  NA 132* 134*  K 4.1 3.9  CL 98 99  CO2 27 27  BUN 10 10  CREATININE 0.43* 0.45  GLUCOSE 98 97  CALCIUM 8.7* 8.7*   No results for input(s): "LABPT", "INR" in the last 72 hours.  EXAM: General - Patient is Alert and Oriented Extremity - Neurovascular intact Sensation intact distally Dorsiflexion/Plantar flexion intact Compartment soft Incision - clean, dry, with the wound VAC  in place. Motor Function  -plantarflexion and dorsiflexion intact.  Ambulated 200 feet with physical therapy.  Assessment/Plan: 5 Days Post-Op Procedure(s) (LRB): IRRIGATION AND DEBRIDEMENT HIP WITH POLY EXCHANGE (Left) Procedure(s) (LRB): IRRIGATION AND DEBRIDEMENT HIP WITH POLY EXCHANGE (Left) Past Medical History:  Diagnosis Date   Abnormal EKG    Anemia    Arthritis    Depression    Fibromyalgia    GERD (gastroesophageal reflux disease)    History of hiatal hernia    Hypertension    Scoliosis    had trouble getting spinal in for TKA in 2022   Tachycardia    Principal Problem:   Prosthetic joint infection of left hip (HCC) Active Problems:   Hyperkalemia   Acute blood loss as cause of postoperative anemia   Shock circulatory (HCC)   Hypomagnesemia  Estimated body mass index is 25.95 kg/m as calculated from the following:   Height as of this encounter: 5' 3.5" (1.613 m).   Weight as of this encounter: 67.5 kg. Advance diet Up with therapy Discharge home with home health Diet - Regular diet Follow up - in 2 weeks Activity - WBAT Disposition - Home with home health physical therapy.  Home with PICC line nursing for IV antibiotic therapy for 6 weeks. Condition Upon Discharge - Stable DVT Prophylaxis - Aspirin and TED hose  Dedra Skeens, PA-C Orthopaedic Surgery 09/07/2022, 7:05 AM

## 2022-09-07 NOTE — Progress Notes (Signed)
   Subjective: 5 Days Post-Op Procedure(s) (LRB): IRRIGATION AND DEBRIDEMENT HIP WITH POLY EXCHANGE (Left) Patient reports pain as mild.   Patient is well, and has had no acute complaints or problems Denies any CP, SOB, ABD pain. We will continue therapy today.  Plan is to go Home after hospital stay.  Objective: Vital signs in last 24 hours: Temp:  [98.6 F (37 C)-99.1 F (37.3 C)] 98.6 F (37 C) (06/17 0428) Pulse Rate:  [77-85] 80 (06/17 0428) Resp:  [18-20] 20 (06/17 0428) BP: (118-133)/(64-80) 118/80 (06/17 0428) SpO2:  [96 %-98 %] 97 % (06/17 0428)  Intake/Output from previous day: 06/16 0701 - 06/17 0700 In: 10 [I.V.:10] Out: 750 [Urine:700; Drains:50] Intake/Output this shift: Total I/O In: -  Out: 300 [Urine:300]  No results for input(s): "HGB" in the last 72 hours.  No results for input(s): "WBC", "RBC", "HCT", "PLT" in the last 72 hours.  Recent Labs    09/06/22 0550 09/07/22 0450  NA 132* 134*  K 4.1 3.9  CL 98 99  CO2 27 27  BUN 10 10  CREATININE 0.43* 0.45  GLUCOSE 98 97  CALCIUM 8.7* 8.7*   No results for input(s): "LABPT", "INR" in the last 72 hours.   EXAM General - Patient is Alert, Appropriate, and Oriented Extremity - Neurovascular intact Sensation intact distally Intact pulses distally Dorsiflexion/Plantar flexion intact Dressing - dressing C/D/I and no drainage, Praveena intact with 50 cc of drainage in canister.   Motor Function - intact, moving foot and toes well on exam.  Ambulated 200 feet with physical therapy.  Past Medical History:  Diagnosis Date   Abnormal EKG    Anemia    Arthritis    Depression    Fibromyalgia    GERD (gastroesophageal reflux disease)    History of hiatal hernia    Hypertension    Scoliosis    had trouble getting spinal in for TKA in 2022   Tachycardia     Assessment/Plan:   5 Days Post-Op Procedure(s) (LRB): IRRIGATION AND DEBRIDEMENT HIP WITH POLY EXCHANGE (Left) Principal Problem:    Prosthetic joint infection of left hip (HCC) Active Problems:   Hyperkalemia   Acute blood loss as cause of postoperative anemia   Shock circulatory (HCC)   Hypomagnesemia  Estimated body mass index is 25.95 kg/m as calculated from the following:   Height as of this encounter: 5' 3.5" (1.613 m).   Weight as of this encounter: 67.5 kg. Advance diet Up with therapy, weightbearing as tolerated  Pain well-controlled  PICC line in place, continue with IV antibiotics per infectious disease.  Cultures show MSSA. ID has made abx recommendations and orders placed  Plan is for patient to discharge home with home health PT/nursing. D/C home tomorrow. Patients daughter will be in town tomorrow afternoon to assist patient.  Patient will need 2-week follow-up with [facility] orthopedics She will continue with aspirin 81 mg twice daily Prevena negative pressure dressing to be removed 7 days after discharge date and honeycomb dressing applied. Patient has her own wound vac unit to hookup to provena dressing.    DVT Prophylaxis - Aspirin, TED hose, and SCDs Weight-Bearing as tolerated to left leg   Dedra Skeens, PA-C [facility] 09/07/2022, 6:52 AM

## 2022-09-07 NOTE — Progress Notes (Signed)
Discharge instructions reviewed with patient. Pt will be discharged with prevena to left hip. She will have 6 weeks antibiotics at home as well. Pt verbalized understanding of instructions

## 2022-09-07 NOTE — Progress Notes (Signed)
PHARMACY CONSULT NOTE  Pharmacy Consult for Electrolyte Monitoring and Replacement   Recent Labs: Potassium (mmol/L)  Date Value  09/07/2022 3.9   Magnesium (mg/dL)  Date Value  16/12/9602 1.5 (L)   Calcium (mg/dL)  Date Value  54/11/8117 8.7 (L)   Albumin (g/dL)  Date Value  14/78/2956 2.9 (L)   Phosphorus (mg/dL)  Date Value  21/30/8657 2.8   Sodium (mmol/L)  Date Value  09/07/2022 134 (L)     Assessment: 72 yo female who previously underwent a left total hip arthroplasty on 04/15/22. 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

## 2022-09-08 LAB — MAGNESIUM, URINE, 24 HOUR
Magnesium, urine: 48.2 mg/dL
Magnesium,Urine 24hr: 457.9 mg/24 hr — ABNORMAL HIGH (ref 12.0–293.0)
Total Volume: 950

## 2022-09-15 ENCOUNTER — Other Ambulatory Visit: Payer: Medicare Other

## 2022-09-22 ENCOUNTER — Ambulatory Visit: Payer: Medicare Other | Attending: Infectious Diseases | Admitting: Infectious Diseases

## 2022-09-22 DIAGNOSIS — B9562 Methicillin resistant Staphylococcus aureus infection as the cause of diseases classified elsewhere: Secondary | ICD-10-CM | POA: Diagnosis not present

## 2022-09-22 DIAGNOSIS — A4902 Methicillin resistant Staphylococcus aureus infection, unspecified site: Secondary | ICD-10-CM | POA: Insufficient documentation

## 2022-09-22 DIAGNOSIS — X58XXXA Exposure to other specified factors, initial encounter: Secondary | ICD-10-CM | POA: Insufficient documentation

## 2022-09-22 DIAGNOSIS — D62 Acute posthemorrhagic anemia: Secondary | ICD-10-CM | POA: Diagnosis not present

## 2022-09-22 DIAGNOSIS — T8452XA Infection and inflammatory reaction due to internal left hip prosthesis, initial encounter: Secondary | ICD-10-CM

## 2022-09-22 DIAGNOSIS — T8452XD Infection and inflammatory reaction due to internal left hip prosthesis, subsequent encounter: Secondary | ICD-10-CM

## 2022-09-22 NOTE — Progress Notes (Signed)
NAME: Gina Matthews  DOB: [birth year]  MRN: [id number]  Date/Time: 09/22/2022 1.45PM   Subjective:  Seen patient with Dr. Ernest Pine in his office. Follow-up after recent hospital discharge. 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  She was discharged home on IV daptomycin and p.o. rifampin. She is doing better There is minimal discharge from the surgical wound She does not have any fever No muscle aches No diarrhea No cough or shortness of breath No rash She is to continue IV antibiotics for 6 weeks until 10/14/2022 after which she will be placed on p.o. antibiotics.  That could be a a, additional Bactrim plus rifampin or Doxy plus rifampin Gina Matthews is a 72 year old female with history of right TKA, Patient, left THA on 04/20/2022.  This was complicated by hematoma which was evacuated on 05/11/2022.  On a follow-up visit on 05/29/2022 a culture was sent and it was MRSA.  She was given Bactrim for a few days in March.  The surgical site healed well except for 1 area which continue to discharge.  This was at the lower end and she had a VAC dressing.  In May 2024 she was placed on Bactrim and had taken the entire month.  When she stopped the medication she noticed more discharge and a new area bubbling on the top half.  And she was seen by orthopedics on 08/28/2022 in his office and felt it was aspirated and sent for culture and she was hospitalized for exploration and culture and exchange of the Seal.  She underwent this procedure on 09/02/2022.  Past Medical History:  Diagnosis Date   Abnormal EKG    Anemia    Arthritis    Depression    Fibromyalgia    GERD (gastroesophageal reflux disease)    History of hiatal hernia    Hypertension    Scoliosis    had trouble getting spinal in for TKA in 2022   Tachycardia     Past Surgical History:  Procedure Laterality Date   APPLICATION OF WOUND VAC Left 05/11/2022    Procedure: APPLICATION OF WOUND VAC  [id number];  Surgeon: Donato Heinz, MD;  Location: [facility];  Service: Orthopedics;  Laterality: Left;  [id number]   CESAREAN SECTION     x 4   COSMETIC SURGERY     lip injury   FRACTURE SURGERY Right    foot ORIF-age 68   HERNIA REPAIR Left    inguinal   INCISION AND DRAINAGE Left 05/11/2022   Procedure: INCISION AND DRAINAGE EVACUATION HEMATOMA LEFT HIP;  Surgeon: Donato Heinz, MD;  Location: [facility];  Service: Orthopedics;  Laterality: Left;   INCISION AND DRAINAGE HIP Left 09/02/2022   Procedure: IRRIGATION AND DEBRIDEMENT HIP WITH POLY EXCHANGE;  Surgeon: Donato Heinz, MD;  Location: [facility];  Service: Orthopedics;  Laterality: Left;   KNEE ARTHROPLASTY Right 01/27/2021   Procedure: COMPUTER ASSISTED TOTAL KNEE ARTHROPLASTY;  Surgeon: Donato Heinz, MD;  Location: [facility];  Service: Orthopedics;  Laterality: Right;   MOUTH SURGERY     implants   TONSILLECTOMY     TOTAL HIP ARTHROPLASTY Left 04/15/2022   Procedure: TOTAL HIP ARTHROPLASTY;  Surgeon: Donato Heinz, MD;  Location: [facility];  Service: Orthopedics;  Laterality: Left;    Social History   Socioeconomic History   Marital status: Widowed    Spouse name: Not on file   Number of children: Not  on file   Years of education: Not on file   Highest education level: Not on file  Occupational History   Not on file  Tobacco Use   Smoking status: Never   Smokeless tobacco: Never  Vaping Use   Vaping Use: Never used  Substance and Sexual Activity   Alcohol use: Never   Drug use: Never   Sexual activity: Not on file  Other Topics Concern   Not on file  Social History Narrative   Not on file   Social Determinants of Health   Financial Resource Strain: Not on file  Food Insecurity: No Food Insecurity (09/03/2022)   Hunger Vital Sign    Worried About Running Out of Food in the Last Year: Never true    Ran Out of Food in the Last Year: Never true  Transportation Needs: No  Transportation Needs (09/03/2022)   PRAPARE - Administrator, Civil Service (Medical): No    Lack of Transportation (Non-Medical): No  Physical Activity: Not on file  Stress: Not on file  Social Connections: Not on file  Intimate Partner Violence: Not At Risk (09/03/2022)   Humiliation, Afraid, Rape, and Kick questionnaire    Fear of Current or Ex-Partner: No    Emotionally Abused: No    Physically Abused: No    Sexually Abused: No    Family History  Problem Relation Age of Onset   Breast cancer Neg Hx    No Known Allergies I? Current Outpatient Medications  Medication Sig Dispense Refill   acetaminophen (TYLENOL) 500 MG tablet Take 1,000 mg by mouth 2 (two) times daily.     alendronate (FOSAMAX) 70 MG tablet Take 70 mg by mouth once a week. Take with a full glass of water on an empty stomach. Wednesdays     amLODipine (NORVASC) 5 MG tablet Take 5 mg by mouth every evening.     aspirin 81 MG chewable tablet Chew 1 tablet (81 mg total) by mouth 2 (two) times daily. 84 tablet 0   calcium-vitamin D (OSCAL WITH D) 500-5 MG-MCG tablet Take 1 tablet by mouth daily with breakfast.     carvedilol (COREG) 6.25 MG tablet Take 6.25 mg by mouth 2 (two) times daily.     cetirizine (ZYRTEC) 10 MG tablet Take 10 mg by mouth daily as needed for allergies.     daptomycin (CUBICIN) IVPB Inject 500 mg into the vein daily. 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 Fax lab results promptly to [phone].  Method of administration: IV Push (Please pull PICC at completion of IV antibiotics) Method of administration may be changed at the discretion of home infusion pharmacist based upon assessment of the patient and/or caregiver's ability to self-administer the medication ordered. 41 Units 0   DULoxetine HCl 60 MG CSDR Take 60 mg by mouth every morning.  2   gabapentin (NEURONTIN)  600 MG tablet Take 600-1,200 mg by mouth See admin instructions. Take 1200 mg in the morning and 600 mg noon and 1200 mg at bedtime  0   lisinopril (ZESTRIL) 40 MG tablet Take 40 mg by mouth every morning.     magnesium chloride (SLOW-MAG) 64 MG TBEC SR tablet Take 1 tablet (64 mg total) by mouth 2 (two) times daily. 60 tablet 0   Multiple Vitamins-Minerals (MULTIVITAMIN WITH MINERALS) tablet Take 1 tablet  by mouth daily. Senior     ondansetron (ZOFRAN) 4 MG tablet Take 1 tablet (4 mg total) by mouth every 6 (six) hours as needed for nausea. 20 tablet 0   oxyCODONE (OXY IR/ROXICODONE) 5 MG immediate release tablet Take 1 tablet (5 mg total) by mouth every 4 (four) hours as needed for moderate pain (pain score 4-6). 30 tablet 0   pantoprazole (PROTONIX) 40 MG tablet Take 40 mg by mouth 2 (two) times daily.     rifampin (RIFADIN) 300 MG capsule Take 1 capsule (300 mg total) by mouth 2 (two) times daily. First dose starting 09/05/22 80 capsule 0   senna-docusate (SENOKOT-S) 8.6-50 MG tablet Take 1 tablet by mouth 2 (two) times daily. 20 tablet 0   traMADol (ULTRAM) 50 MG tablet Take 1-2 tablets (50-100 mg total) by mouth every 4 (four) hours as needed for moderate pain. 30 tablet 0   traZODone (DESYREL) 50 MG tablet Take 50 mg by mouth at bedtime.  5   No current facility-administered medications for this visit.     Abtx:  Anti-infectives (From admission, onward)    None       REVIEW OF SYSTEMS:  Const: negative fever, negative chills, negative weight loss Eyes: negative diplopia or visual changes, negative eye pain ENT: negative coryza, negative sore throat Resp: negative cough, hemoptysis, dyspnea Cards: negative for chest pain, palpitations, lower extremity edema GU: negative for frequency, dysuria and hematuria GI: Negative for abdominal pain, diarrhea, bleeding, constipation Skin: negative for rash and pruritus Heme: negative for easy bruising and gum/nose bleeding MS: negative for  myalgias, arthralgias, back pain and muscle weakness Neurolo:negative for headaches, dizziness, vertigo, memory problems  Psych: negative for feelings of anxiety, depression  Endocrine: negative for thyroid, diabetes Allergy/Immunology- negative for any medication or food allergies  Objective:  VITALS:  There were no vitals taken for this visit. LDA Right PICC line site looks okay PHYSICAL EXAM:  General: Alert, cooperative, no distress, appears stated age.  Head: Normocephalic, without obvious abnormality, atraumatic. Eyes: Conjunctivae clear, anicteric sclerae. Pupils are equal ENT Nares normal. No drainage or sinus tenderness. Lips, mucosa, and tongue normal. No Thrush Neck: Supple, symmetrical, no adenopathy, thyroid: non tender no carotid bruit and no JVD. Back: No CVA tenderness. Lungs: Clear to auscultation bilaterally. No Wheezing or Rhonchi. No rales. Heart: Regular rate and rhythm, no murmur, rub or gallop. Abdomen: Soft, non-tender,not distended. Bowel sounds normal. No masses Extremities: Wound VAC was removed from the surgical site left hip surgical site well-approximated There is a small area on the lower aspect which has got some granulation tissue and there is no obvious discharge     Skin: No rashes or lesions. Or bruising Lymph: Cervical, supraclavicular normal. 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  Underwent DAIR Patient is currently on daptomycin and rifampin and will continue 6 weeks of it until 10/14/2022.  After that she will go on p.o. antibiotics which would be a combination of rifampin plus Bactrim. Patient is doing well and she is 100% adherent to her medication The labs from this week are still pending CK is 247.  Was 20 on 617.  Will observe closely  Acute blood loss anemia after surgery and to receive blood  transfusion.  Discussed the management with the patient and her daughter and with Dr. Ernest Pine Follow-up with me on 10/15/2022  ?  ___________________________________________________ Discussed with patient, requesting provider Note:  This document was prepared using Dragon voice recognition software and may include unintentional dictation errors.

## 2022-09-23 ENCOUNTER — Telehealth: Payer: Self-pay | Admitting: Infectious Diseases

## 2022-09-23 ENCOUNTER — Other Ambulatory Visit: Payer: Medicare Other

## 2022-09-23 NOTE — Telephone Encounter (Signed)
Pt is on Daptomycin + rifampin for MRSA hip PJI Daptomycin MIC is not part of the susceptibility panel and it has to be ordered separately ( done at [facility]) . In her case this was overlooked and it was never ordered- The lab does not have the culture from 6/12 ( as they discard non blood cultures after 7 days) I explained to patient that we dont know daptomycin MIC. As she has never taken Daptomycin before and the vancomycin MIC  is highly susceptible ( 0.5) It is highly likely that daptomycin is susceptible . Pt has profuse discharge from the surgical site We have 3 options - continue daptomycin ( assuming it would be susceptible and follow inflammatory markers) or change to vancomycin or Or repeat another culture (of the fluid from the hip)and if there is no MRSA  continue Dapto . If MRSA present then get daptomycin MIC Pt prefers the 3rd option Will discuss with Dr.Hooten

## 2022-09-28 ENCOUNTER — Telehealth: Payer: Self-pay | Admitting: Pharmacist

## 2022-09-28 NOTE — Telephone Encounter (Signed)
HH nurses Junious Dresser and Pam called today regarding lab draw for patient. Stated they could not draw labs from PICC and needed verbal to draw labs via puncture today. Provided verbal order to draw labs via puncture today. Patient last appt with Dr. Rivka Safer on 7/11 and further labs can be drawn then if needed.  Margarite Gouge, PharmD, CPP, BCIDP, AAHIVP Clinical Pharmacist Practitioner Infectious Diseases Clinical Pharmacist [facility]

## 2022-09-30 ENCOUNTER — Encounter: Payer: Self-pay | Admitting: Infectious Diseases

## 2022-10-01 ENCOUNTER — Inpatient Hospital Stay: Payer: Medicare Other | Admitting: Infectious Diseases

## 2022-10-13 ENCOUNTER — Telehealth: Payer: Self-pay

## 2022-10-13 ENCOUNTER — Ambulatory Visit: Payer: Medicare Other | Attending: Infectious Diseases | Admitting: Infectious Diseases

## 2022-10-13 DIAGNOSIS — T8452XD Infection and inflammatory reaction due to internal left hip prosthesis, subsequent encounter: Secondary | ICD-10-CM | POA: Diagnosis not present

## 2022-10-13 DIAGNOSIS — Z8614 Personal history of Methicillin resistant Staphylococcus aureus infection: Secondary | ICD-10-CM

## 2022-10-13 DIAGNOSIS — D62 Acute posthemorrhagic anemia: Secondary | ICD-10-CM | POA: Diagnosis not present

## 2022-10-13 DIAGNOSIS — T8452XA Infection and inflammatory reaction due to internal left hip prosthesis, initial encounter: Secondary | ICD-10-CM | POA: Insufficient documentation

## 2022-10-13 DIAGNOSIS — T8450XD Infection and inflammatory reaction due to unspecified internal joint prosthesis, subsequent encounter: Secondary | ICD-10-CM

## 2022-10-13 DIAGNOSIS — B9562 Methicillin resistant Staphylococcus aureus infection as the cause of diseases classified elsewhere: Secondary | ICD-10-CM | POA: Diagnosis not present

## 2022-10-13 DIAGNOSIS — Z09 Encounter for follow-up examination after completed treatment for conditions other than malignant neoplasm: Secondary | ICD-10-CM | POA: Diagnosis present

## 2022-10-13 MED ORDER — SULFAMETHOXAZOLE-TRIMETHOPRIM 800-160 MG PO TABS: 1.0000 | ORAL_TABLET | Freq: Two times a day (BID) | ORAL | 1 refills | Status: DC

## 2022-10-13 MED ORDER — RIFAMPIN 300 MG PO CAPS: 300.0000 mg | ORAL_CAPSULE | Freq: Two times a day (BID) | ORAL | 1 refills | Status: DC

## 2022-10-13 NOTE — Telephone Encounter (Signed)
Per Dr. Rivka Safer patient's last dose of IV abx will be 10/14/22 and picc line can be removed after last dose. I have updated [facility] with orders and our pharmacy team. Gina Matthews

## 2022-10-13 NOTE — Progress Notes (Signed)
NAME: Gina Matthews  DOB: December 09, 1950  MRN: [id number]  Date/Time: 10/13/2022 11.30Am   Subjective:   patient seen with Dr. Ernest Pine in his office. 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  She was discharged home on IV daptomycin and p.o. rifampin. She is doing better She does not have any fever No muscle aches No diarrhea No cough or shortness of breath No rash  Past Medical History:  Diagnosis Date   Abnormal EKG    Anemia    Arthritis    Depression    Fibromyalgia    GERD (gastroesophageal reflux disease)    History of hiatal hernia    Hypertension    Scoliosis    had trouble getting spinal in for TKA in 2022   Tachycardia     Past Surgical History:  Procedure Laterality Date   APPLICATION OF WOUND VAC Left 05/11/2022   Procedure: APPLICATION OF WOUND VAC  [id number];  Surgeon: Donato Heinz, MD;  Location: [facility];  Service: Orthopedics;  Laterality: Left;  [id number]   CESAREAN SECTION     x 4   COSMETIC SURGERY     lip injury   FRACTURE SURGERY Right    foot ORIF-age 7   HERNIA REPAIR Left    inguinal   INCISION AND DRAINAGE Left 05/11/2022   Procedure: INCISION AND DRAINAGE EVACUATION HEMATOMA LEFT HIP;  Surgeon: Donato Heinz, MD;  Location: [facility];  Service: Orthopedics;  Laterality: Left;   INCISION AND DRAINAGE HIP Left 09/02/2022   Procedure: IRRIGATION AND DEBRIDEMENT HIP WITH POLY EXCHANGE;  Surgeon: Donato Heinz, MD;  Location: [facility];  Service: Orthopedics;  Laterality: Left;   KNEE ARTHROPLASTY  Right 01/27/2021   Procedure: COMPUTER ASSISTED TOTAL KNEE ARTHROPLASTY;  Surgeon: Donato Heinz, MD;  Location: [facility];  Service: Orthopedics;  Laterality: Right;   MOUTH SURGERY     implants   TONSILLECTOMY     TOTAL HIP ARTHROPLASTY Left 04/15/2022   Procedure: TOTAL HIP ARTHROPLASTY;  Surgeon: Donato Heinz, MD;  Location: [facility];  Service: Orthopedics;  Laterality: Left;    Social History   Socioeconomic History   Marital status: Widowed    Spouse name: Not on file   Number of children: Not on file   Years of education: Not on file   Highest education level: Not on file  Occupational History   Not on file  Tobacco Use   Smoking status: Never   Smokeless tobacco: Never  Vaping Use   Vaping status: Never Used  Substance and Sexual Activity   Alcohol use: Never   Drug use: Never   Sexual activity: Not on file  Other Topics Concern   Not on file  Social History Narrative   Not on file   Social Determinants of Health   Financial Resource Strain: Low Risk  (12/01/2021)   Received from [facility]   Overall Financial Resource Strain (CARDIA)    Difficulty of Paying Living Expenses: Not hard  at all  Food Insecurity: No Food Insecurity (09/03/2022)   Hunger Vital Sign    Worried About Running Out of Food in the Last Year: Never true    Ran Out of Food in the Last Year: Never true  Transportation Needs: No Transportation Needs (09/03/2022)   PRAPARE - Administrator, Civil Service (Medical): No    Lack of Transportation (Non-Medical): No  Physical Activity: Not on file  Stress: Not on file  Social Connections: Not on file  Intimate Partner Violence: Not At Risk (09/03/2022)   Humiliation, Afraid, Rape, and Kick questionnaire    Fear of Current or Ex-Partner: No    Emotionally Abused: No    Physically Abused: No    Sexually Abused: No    Family History  Problem Relation Age of Onset   Breast cancer Neg  Hx    No Known Allergies I? Current Outpatient Medications  Medication Sig Dispense Refill   acetaminophen (TYLENOL) 500 MG tablet Take 1,000 mg by mouth 2 (two) times daily.     alendronate (FOSAMAX) 70 MG tablet Take 70 mg by mouth once a week. Take with a full glass of water on an empty stomach. Wednesdays     amLODipine (NORVASC) 5 MG tablet Take 5 mg by mouth every evening.     aspirin 81 MG chewable tablet Chew 1 tablet (81 mg total) by mouth 2 (two) times daily. 84 tablet 0   calcium-vitamin D (OSCAL WITH D) 500-5 MG-MCG tablet Take 1 tablet by mouth daily with breakfast.     carvedilol (COREG) 6.25 MG tablet Take 6.25 mg by mouth 2 (two) times daily.     cetirizine (ZYRTEC) 10 MG tablet Take 10 mg by mouth daily as needed for allergies.     daptomycin (CUBICIN) IVPB Inject 500 mg into the vein daily. 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 Fax lab results promptly to [phone].  Method of administration: IV Push (Please pull PICC at completion of IV antibiotics) Method of administration may be changed at the discretion of home infusion pharmacist based upon assessment of the patient and/or caregiver's ability to self-administer the medication ordered. 41 Units 0   DULoxetine HCl 60 MG CSDR Take 60 mg by mouth every morning.  2   gabapentin (NEURONTIN) 600 MG tablet Take 600-1,200 mg by mouth See admin instructions. Take 1200 mg in the morning and 600 mg noon and 1200 mg at bedtime  0   lisinopril (ZESTRIL) 40 MG tablet Take 40 mg by mouth every morning.     magnesium chloride (SLOW-MAG) 64 MG TBEC SR tablet Take 1 tablet (64 mg total) by mouth 2 (two) times daily. 60 tablet 0   Multiple Vitamins-Minerals (MULTIVITAMIN WITH MINERALS) tablet Take 1 tablet by mouth daily. Senior     ondansetron (ZOFRAN) 4 MG tablet Take 1 tablet (4 mg total) by mouth every 6 (six) hours as  needed for nausea. 20 tablet 0   oxyCODONE (OXY IR/ROXICODONE) 5 MG immediate release tablet Take 1 tablet (5 mg total) by mouth every 4 (four) hours as needed for moderate pain (pain score 4-6). 30 tablet 0   pantoprazole (PROTONIX) 40 MG tablet Take 40 mg by mouth 2 (two) times daily.     rifampin (RIFADIN) 300 MG capsule Take 1 capsule (300 mg total) by mouth 2 (two)  times daily. First dose starting 09/05/22 80 capsule 0   senna-docusate (SENOKOT-S) 8.6-50 MG tablet Take 1 tablet by mouth 2 (two) times daily. 20 tablet 0   traMADol (ULTRAM) 50 MG tablet Take 1-2 tablets (50-100 mg total) by mouth every 4 (four) hours as needed for moderate pain. 30 tablet 0   traZODone (DESYREL) 50 MG tablet Take 50 mg by mouth at bedtime.  5   No current facility-administered medications for this visit.     Abtx:  Anti-infectives (From admission, onward)    None       REVIEW OF SYSTEMS:  Const: negative fever, negative chills, negative weight loss Eyes: negative diplopia or visual changes, negative eye pain ENT: negative coryza, negative sore throat Resp: negative cough, hemoptysis, dyspnea Cards: negative for chest pain, palpitations, lower extremity edema GU: negative for frequency, dysuria and hematuria GI: Negative for abdominal pain, diarrhea, bleeding, constipation Skin: negative for rash and pruritus Heme: negative for easy bruising and gum/nose bleeding MS: weakness Neurolo:negative for headaches, dizziness, vertigo, memory problems  Psych: negative for feelings of anxiety, depression  Endocrine: negative for thyroid, diabetes Allergy/Immunology- negative for any medication or food allergies  Objective:  VITALS:  There were no vitals taken for this visit. LDA Right PICC line site looks okay PHYSICAL EXAM:  General: Alert, cooperative, no distress, appears stated age.  Head: Normocephalic, without obvious abnormality, atraumatic. Eyes: Conjunctivae clear, anicteric sclerae. Pupils  are equal ENT Nares normal. No drainage or sinus tenderness. Lips, mucosa, and tongue normal. No Thrush Neck: Supple, symmetrical, no adenopathy, thyroid: non tender no carotid bruit and no JVD. Back: No CVA tenderness. Lungs: Clear to auscultation bilaterally. No Wheezing or Rhonchi. No rales. Heart: Regular rate and rhythm, no murmur, rub or gallop. Abdomen: Soft, non-tender,not distended. Bowel sounds normal. No masses Extremities: Wound VAC was removed from the surgical site left hip surgical site well-approximated There is a small area on the lower aspect which has got some granulation tissue and there is no obvious discharge        Skin: No rashes or lesions. Or bruising Lymph: Cervical, supraclavicular normal. 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  Underwent DAIR Patient is currently on daptomycin and rifampin and will finish 10/14/2022. PICC line will be removed and  she will start PO bactrim DS 1 BID along with Rifampin 300mg  PO BID for 6 weeks. Following which she may be on just one antibiotic for a prolonged perioed   Acute blood loss anemia after surgery and to receive blood transfusion.  Discussed the management with the patient and her daughter and with Dr. Ernest Pine Will follow her in 2 weeks ?Note:  This document was prepared using Dragon voice recognition software and may include unintentional dictation errors.

## 2022-10-15 ENCOUNTER — Ambulatory Visit: Payer: Medicare Other | Admitting: Infectious Diseases

## 2022-10-16 ENCOUNTER — Other Ambulatory Visit: Payer: Self-pay

## 2022-10-16 ENCOUNTER — Encounter: Payer: Self-pay | Admitting: Infectious Diseases

## 2022-10-16 MED ORDER — ONDANSETRON HCL 4 MG PO TABS: 4.0000 mg | ORAL_TABLET | Freq: Two times a day (BID) | ORAL | 0 refills | Status: DC | PRN

## 2022-10-22 ENCOUNTER — Other Ambulatory Visit: Payer: Self-pay | Admitting: Pediatrics

## 2022-10-22 DIAGNOSIS — Z1231 Encounter for screening mammogram for malignant neoplasm of breast: Secondary | ICD-10-CM

## 2022-11-06 MED ORDER — ONDANSETRON HCL 4 MG PO TABS: 4.0000 mg | ORAL_TABLET | Freq: Two times a day (BID) | ORAL | 0 refills | Status: DC | PRN

## 2022-11-19 ENCOUNTER — Ambulatory Visit
Admission: RE | Admit: 2022-11-19 | Discharge: 2022-11-19 | Disposition: A | Discharge status: Home / Self Care | Payer: Medicare Other | Source: Ambulatory Visit | Attending: Pediatrics | Admitting: Pediatrics

## 2022-11-19 DIAGNOSIS — Z1231 Encounter for screening mammogram for malignant neoplasm of breast: Secondary | ICD-10-CM | POA: Diagnosis not present

## 2022-11-19 DIAGNOSIS — M810 Age-related osteoporosis without current pathological fracture: Secondary | ICD-10-CM

## 2022-11-24 ENCOUNTER — Other Ambulatory Visit: Payer: Self-pay

## 2022-11-24 MED ORDER — ONDANSETRON HCL 4 MG PO TABS: 4.0000 mg | ORAL_TABLET | Freq: Two times a day (BID) | ORAL | 0 refills | Status: DC | PRN

## 2022-11-26 ENCOUNTER — Other Ambulatory Visit: Payer: Self-pay

## 2022-11-26 DIAGNOSIS — B20 Human immunodeficiency virus [HIV] disease: Secondary | ICD-10-CM

## 2022-11-26 DIAGNOSIS — T8450XD Infection and inflammatory reaction due to unspecified internal joint prosthesis, subsequent encounter: Secondary | ICD-10-CM

## 2022-11-30 ENCOUNTER — Other Ambulatory Visit
Admission: RE | Admit: 2022-11-30 | Discharge: 2022-11-30 | Disposition: A | Discharge status: Home / Self Care | Payer: Medicare Other | Attending: Infectious Diseases | Admitting: Infectious Diseases

## 2022-11-30 DIAGNOSIS — T8450XD Infection and inflammatory reaction due to unspecified internal joint prosthesis, subsequent encounter: Secondary | ICD-10-CM | POA: Diagnosis present

## 2022-11-30 LAB — COMPREHENSIVE METABOLIC PANEL
ALT: 23 U/L (ref 0–44)
AST: 27 U/L (ref 15–41)
Albumin: 3.7 g/dL (ref 3.5–5.0)
Alkaline Phosphatase: 85 U/L (ref 38–126)
Anion gap: 8 (ref 5–15)
BUN: 15 mg/dL (ref 8–23)
CO2: 20 mmol/L — ABNORMAL LOW (ref 22–32)
Calcium: 8.6 mg/dL — ABNORMAL LOW (ref 8.9–10.3)
Chloride: 100 mmol/L (ref 98–111)
Creatinine, Ser: 0.62 mg/dL (ref 0.44–1.00)
GFR, Estimated: >60 mL/min (ref >60)
Glucose, Bld: 91 mg/dL (ref 70–99)
Potassium: 5.2 mmol/L — ABNORMAL HIGH (ref 3.5–5.1)
Sodium: 128 mmol/L — ABNORMAL LOW (ref 135–145)
Total Bilirubin: 0.3 mg/dL (ref 0.3–1.2)
Total Protein: 7 g/dL (ref 6.5–8.1)

## 2022-11-30 LAB — CBC WITH DIFFERENTIAL/PLATELET
Abs Immature Granulocytes: 0.02 10*3/uL (ref 0.00–0.07)
Basophils Absolute: 0.1 10*3/uL (ref 0.0–0.1)
Basophils Relative: 1 %
Eosinophils Absolute: 0.2 10*3/uL (ref 0.0–0.5)
Eosinophils Relative: 3 %
HCT: 35.4 % — ABNORMAL LOW (ref 36.0–46.0)
Hemoglobin: 11.7 g/dL — ABNORMAL LOW (ref 12.0–15.0)
Immature Granulocytes: 0 %
Lymphocytes Relative: 35 %
Lymphs Abs: 1.9 10*3/uL (ref 0.7–4.0)
MCH: 30.8 pg (ref 26.0–34.0)
MCHC: 33.1 g/dL (ref 30.0–36.0)
MCV: 93.2 fL (ref 80.0–100.0)
Monocytes Absolute: 0.6 10*3/uL (ref 0.1–1.0)
Monocytes Relative: 10 %
Neutro Abs: 2.7 10*3/uL (ref 1.7–7.7)
Neutrophils Relative %: 51 %
Platelets: 367 10*3/uL (ref 150–400)
RBC: 3.8 MIL/uL — ABNORMAL LOW (ref 3.87–5.11)
RDW: 13.3 % (ref 11.5–15.5)
WBC: 5.3 10*3/uL (ref 4.0–10.5)
nRBC: 0 % (ref 0.0–0.2)

## 2022-11-30 LAB — SEDIMENTATION RATE: Sed Rate: 34 mm/h — ABNORMAL HIGH (ref 0–30)

## 2022-11-30 LAB — C-REACTIVE PROTEIN: CRP: 0.5 mg/dL (ref <1.0)

## 2022-11-30 NOTE — Addendum Note (Signed)
Addended by: Lonell Face C on: 11/30/2022 10:01 AM   Modules accepted: Orders

## 2022-12-01 ENCOUNTER — Other Ambulatory Visit: Payer: Self-pay

## 2022-12-01 MED ORDER — DOXYCYCLINE HYCLATE 100 MG PO TABS: 100.0000 mg | ORAL_TABLET | Freq: Two times a day (BID) | ORAL | 1 refills | Status: DC

## 2022-12-01 NOTE — Telephone Encounter (Signed)
I spoke to patient and discussed labs with her. Patient would rather switch to doxy. I have sent the RX to the pharmacy and she is scheduled to see you on 12/15/22

## 2022-12-15 ENCOUNTER — Encounter: Payer: Self-pay | Admitting: Infectious Diseases

## 2022-12-15 ENCOUNTER — Ambulatory Visit: Payer: Medicare Other | Attending: Infectious Diseases | Admitting: Infectious Diseases

## 2022-12-15 VITALS — BP 126/76 | HR 73 | Temp 96.6°F | Ht 63.0 in | Wt 147.0 lb

## 2022-12-15 DIAGNOSIS — B9562 Methicillin resistant Staphylococcus aureus infection as the cause of diseases classified elsewhere: Secondary | ICD-10-CM | POA: Insufficient documentation

## 2022-12-15 DIAGNOSIS — Z8614 Personal history of Methicillin resistant Staphylococcus aureus infection: Secondary | ICD-10-CM | POA: Diagnosis not present

## 2022-12-15 DIAGNOSIS — M5416 Radiculopathy, lumbar region: Secondary | ICD-10-CM | POA: Insufficient documentation

## 2022-12-15 DIAGNOSIS — T8450XD Infection and inflammatory reaction due to unspecified internal joint prosthesis, subsequent encounter: Secondary | ICD-10-CM

## 2022-12-15 DIAGNOSIS — T8452XD Infection and inflammatory reaction due to internal left hip prosthesis, subsequent encounter: Secondary | ICD-10-CM | POA: Insufficient documentation

## 2022-12-15 DIAGNOSIS — M549 Dorsalgia, unspecified: Secondary | ICD-10-CM | POA: Diagnosis not present

## 2022-12-15 MED ORDER — DOXYCYCLINE HYCLATE 100 MG PO TABS: 100.0000 mg | ORAL_TABLET | Freq: Two times a day (BID) | ORAL | 5 refills | Status: DC

## 2022-12-15 MED ORDER — ONDANSETRON HCL 4 MG PO TABS: 4.0000 mg | ORAL_TABLET | Freq: Two times a day (BID) | ORAL | 2 refills | Status: DC | PRN

## 2022-12-15 NOTE — Progress Notes (Unsigned)
NAME: Gina Matthews  DOB: 1950/04/21  MRN: [id number]  Date/Time: 12/15/2022 11.30Am   Subjective:   patient seen with Dr. Ernest Pine in his office. 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  She was discharged home on IV daptomycin and p.o. rifampin. She is doing better She does not have any fever No muscle aches No diarrhea No cough or shortness of breath No rash  Past Medical History:  Diagnosis Date   Abnormal EKG    Anemia    Arthritis    Depression    Fibromyalgia    GERD (gastroesophageal reflux disease)    History of hiatal hernia    Hypertension    Scoliosis    had trouble getting spinal in for TKA in 2022   Tachycardia     Past Surgical History:  Procedure Laterality Date   APPLICATION OF WOUND VAC Left 05/11/2022   Procedure: APPLICATION OF WOUND VAC  [id number];  Surgeon: Donato Heinz, MD;  Location: [facility];  Service: Orthopedics;  Laterality: Left;  [id number]   CESAREAN SECTION     x 4   COSMETIC SURGERY     lip injury   FRACTURE SURGERY Right    foot ORIF-age 43   HERNIA REPAIR Left    inguinal   INCISION AND DRAINAGE Left 05/11/2022   Procedure: INCISION AND DRAINAGE EVACUATION HEMATOMA LEFT HIP;  Surgeon: Donato Heinz, MD;  Location: [facility];  Service: Orthopedics;  Laterality: Left;   INCISION AND DRAINAGE HIP Left 09/02/2022   Procedure: IRRIGATION AND DEBRIDEMENT HIP WITH POLY EXCHANGE;  Surgeon: Donato Heinz, MD;  Location: [facility];  Service: Orthopedics;  Laterality: Left;   KNEE ARTHROPLASTY  Right 01/27/2021   Procedure: COMPUTER ASSISTED TOTAL KNEE ARTHROPLASTY;  Surgeon: Donato Heinz, MD;  Location: [facility];  Service: Orthopedics;  Laterality: Right;   MOUTH SURGERY     implants   TONSILLECTOMY     TOTAL HIP ARTHROPLASTY Left 04/15/2022   Procedure: TOTAL HIP ARTHROPLASTY;  Surgeon: Donato Heinz, MD;  Location: [facility];  Service: Orthopedics;  Laterality: Left;    Social History   Socioeconomic History   Marital status: Widowed    Spouse name: Not on file   Number of children: Not on file   Years of education: Not on file   Highest education level: Not on file  Occupational History   Not on file  Tobacco Use   Smoking status: Never   Smokeless tobacco: Never  Vaping Use   Vaping status: Never Used  Substance and Sexual Activity   Alcohol use: Never   Drug use: Never   Sexual activity: Not on file  Other Topics Concern   Not on file  Social History Narrative   Not on file   Social Determinants of Health   Financial Resource Strain: Low Risk  (12/01/2021)   Received from [facility], [facility]   Overall Financial Resource Strain (CARDIA)    Difficulty of Paying Living Expenses: Not hard  at all  Food Insecurity: No Food Insecurity (09/03/2022)   Hunger Vital Sign    Worried About Running Out of Food in the Last Year: Never true    Ran Out of Food in the Last Year: Never true  Transportation Needs: No Transportation Needs (09/03/2022)   PRAPARE - Administrator, Civil Service (Medical): No    Lack of Transportation (Non-Medical): No  Physical Activity: Not on file  Stress: Not on file  Social Connections: Not on file  Intimate Partner Violence: Not At Risk (09/03/2022)   Humiliation, Afraid, Rape, and Kick questionnaire    Fear of Current or Ex-Partner: No    Emotionally Abused: No    Physically Abused: No    Sexually Abused: No    Family History  Problem Relation Age of Onset   Breast cancer Neg  Hx    No Known Allergies I? Current Outpatient Medications  Medication Sig Dispense Refill   acetaminophen (TYLENOL) 500 MG tablet Take 1,000 mg by mouth 2 (two) times daily.     alendronate (FOSAMAX) 70 MG tablet Take 70 mg by mouth once a week. Take with a full glass of water on an empty stomach. Wednesdays     amLODipine (NORVASC) 5 MG tablet Take 5 mg by mouth every evening.     calcium-vitamin D (OSCAL WITH D) 500-5 MG-MCG tablet Take 1 tablet by mouth daily with breakfast.     carvedilol (COREG) 6.25 MG tablet Take 6.25 mg by mouth 2 (two) times daily.     celecoxib (CELEBREX) 200 MG capsule Take 200 mg by mouth 2 (two) times daily.     cetirizine (ZYRTEC) 10 MG tablet Take 10 mg by mouth daily as needed for allergies.     doxycycline (VIBRA-TABS) 100 MG tablet Take 1 tablet (100 mg total) by mouth 2 (two) times daily. 60 tablet 1   DULoxetine HCl 60 MG CSDR Take 60 mg by mouth every morning.  2   gabapentin (NEURONTIN) 600 MG tablet Take 600-1,200 mg by mouth See admin instructions. Take 1200 mg in the morning and 600 mg noon and 1200 mg at bedtime  0   lisinopril (ZESTRIL) 40 MG tablet Take 40 mg by mouth every morning.     magnesium chloride (SLOW-MAG) 64 MG TBEC SR tablet Take 1 tablet (64 mg total) by mouth 2 (two) times daily. 60 tablet 0   Multiple Vitamins-Minerals (MULTIVITAMIN WITH MINERALS) tablet Take 1 tablet by mouth daily. Senior     ondansetron (ZOFRAN) 4 MG tablet Take 1 tablet (4 mg total) by mouth 2 (two) times daily as needed for nausea or vomiting. 30 tablet 0   pantoprazole (PROTONIX) 40 MG tablet Take 40 mg by mouth 2 (two) times daily.     traMADol (ULTRAM) 50 MG tablet Take 1-2 tablets (50-100 mg total) by mouth every 4 (four) hours as needed for moderate pain. 30 tablet 0   traZODone (DESYREL) 50 MG tablet Take 50 mg by mouth at bedtime.  5   No current facility-administered medications for this visit.     Abtx:  Anti-infectives (From admission, onward)     None       REVIEW OF SYSTEMS:  Const: negative fever, negative chills, negative weight loss Eyes: negative diplopia or visual changes, negative eye pain ENT: negative coryza, negative sore throat Resp: negative cough, hemoptysis, dyspnea Cards: negative for chest pain, palpitations, lower extremity edema GU: negative for frequency, dysuria and hematuria GI: Negative for abdominal  pain, diarrhea, bleeding, constipation Skin: negative for rash and pruritus Heme: negative for easy bruising and gum/nose bleeding MS: weakness Neurolo:negative for headaches, dizziness, vertigo, memory problems  Psych: negative for feelings of anxiety, depression  Endocrine: negative for thyroid, diabetes Allergy/Immunology- negative for any medication or food allergies  Objective:  VITALS:  BP 126/76   Pulse 73   Temp (!) 96.6 F (35.9 C) (Temporal)   Ht 5\' 3"  (1.6 m)   Wt 147 lb (66.7 kg)   BMI 26.04 kg/m  LDA Right PICC line site looks okay PHYSICAL EXAM:  General: Alert, cooperative, no distress, appears stated age.  Head: Normocephalic, without obvious abnormality, atraumatic. Eyes: Conjunctivae clear, anicteric sclerae. Pupils are equal ENT Nares normal. No drainage or sinus tenderness. Lips, mucosa, and tongue normal. No Thrush Neck: Supple, symmetrical, no adenopathy, thyroid: non tender no carotid bruit and no JVD. Back: No CVA tenderness. Lungs: Clear to auscultation bilaterally. No Wheezing or Rhonchi. No rales. Heart: Regular rate and rhythm, no murmur, rub or gallop. Abdomen: Soft, non-tender,not distended. Bowel sounds normal. No masses Extremities: Wound VAC was removed from the surgical site left hip surgical site well-approximated There is a small area on the lower aspect which has got some granulation tissue and there is no obvious discharge        Skin: No rashes or lesions. Or bruising Lymph: Cervical, supraclavicular normal. Neurologic: Grossly  non-focal Pertinent Labs 10/11/22 hb 10, wbc 4.4, plt 451crp 23, ck 54 ESR not done due to the age of the specimen   Impression/Recommendation Left hip prosthetic joint infection due to MRSA.  Underwent DAIR Patient ifinished on daptomycin and rifampin on  10/14/2022. And then took  PO bactrim DS 1 BID along with Rifampin 300mg  PO BID for 6 weeks. Now she is on Doxycycline 10mg  Po BID indefinetely She is not colonized with MRSA in the nares  Back pain lumbar radiculopathy L5-S1 EDI  Discussed the management with the patient   ?Note:  This document was prepared using Dragon voice recognition software and may include unintentional dictation errors.

## 2023-03-01 ENCOUNTER — Other Ambulatory Visit
Admission: RE | Admit: 2023-03-01 | Discharge: 2023-03-01 | Disposition: A | Discharge status: Home / Self Care | Payer: Medicare Other | Attending: Infectious Diseases | Admitting: Infectious Diseases

## 2023-03-01 DIAGNOSIS — T8450XD Infection and inflammatory reaction due to unspecified internal joint prosthesis, subsequent encounter: Secondary | ICD-10-CM | POA: Insufficient documentation

## 2023-03-01 LAB — SEDIMENTATION RATE: Sed Rate: 7 mm/h (ref 0–30)

## 2023-03-01 LAB — C-REACTIVE PROTEIN: CRP: <0.5 mg/dL (ref <1.0)

## 2023-03-09 ENCOUNTER — Ambulatory Visit: Payer: Medicare Other | Attending: Infectious Diseases | Admitting: Infectious Diseases

## 2023-03-09 ENCOUNTER — Encounter: Payer: Self-pay | Admitting: Infectious Diseases

## 2023-03-09 DIAGNOSIS — M5416 Radiculopathy, lumbar region: Secondary | ICD-10-CM | POA: Insufficient documentation

## 2023-03-09 DIAGNOSIS — B9562 Methicillin resistant Staphylococcus aureus infection as the cause of diseases classified elsewhere: Secondary | ICD-10-CM | POA: Diagnosis not present

## 2023-03-09 DIAGNOSIS — Z792 Long term (current) use of antibiotics: Secondary | ICD-10-CM | POA: Diagnosis not present

## 2023-03-09 DIAGNOSIS — T8450XD Infection and inflammatory reaction due to unspecified internal joint prosthesis, subsequent encounter: Secondary | ICD-10-CM

## 2023-03-09 DIAGNOSIS — T8452XD Infection and inflammatory reaction due to internal left hip prosthesis, subsequent encounter: Secondary | ICD-10-CM | POA: Diagnosis present

## 2023-03-09 MED ORDER — ONDANSETRON HCL 4 MG PO TABS: 4.0000 mg | ORAL_TABLET | Freq: Two times a day (BID) | ORAL | 2 refills | Status: DC | PRN

## 2023-03-09 MED ORDER — DOXYCYCLINE HYCLATE 100 MG PO TABS: 100.0000 mg | ORAL_TABLET | Freq: Two times a day (BID) | ORAL | 5 refills | Status: DC

## 2023-03-09 NOTE — Progress Notes (Unsigned)
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  She completed IV daptomycin and Po rifampin for 6 weeks and then took rifampin plus Bactrim for 6 more weeks and since 11/30/2022 she has been on p.o. doxycycline.  The wound has healed completely there is some puckering of the skin There is no discharge She is walking with a walker while going out and  at home she walks without any support Past Medical History:  Diagnosis Date   Abnormal EKG    Anemia    Arthritis    Depression    Fibromyalgia    GERD (gastroesophageal reflux disease)    History of hiatal hernia    Hypertension    Scoliosis    had trouble getting spinal in for TKA in 2022   Tachycardia     Past Surgical History:  Procedure Laterality Date   APPLICATION OF WOUND VAC Left 05/11/2022   Procedure: APPLICATION OF WOUND VAC  [id number];  Surgeon: Donato Heinz, MD;  Location: [facility];  Service: Orthopedics;  Laterality: Left;  [id number]   CESAREAN SECTION     x 4   COSMETIC SURGERY     lip injury   FRACTURE SURGERY Right    foot ORIF-age 34   HERNIA REPAIR Left    inguinal   INCISION AND DRAINAGE Left 05/11/2022   Procedure: INCISION AND DRAINAGE EVACUATION HEMATOMA LEFT HIP;  Surgeon: Donato Heinz, MD;  Location: [facility];  Service: Orthopedics;  Laterality: Left;   INCISION AND DRAINAGE HIP Left 09/02/2022   Procedure: IRRIGATION AND DEBRIDEMENT HIP WITH POLY EXCHANGE;  Surgeon: Donato Heinz, MD;  Location: [facility];  Service: Orthopedics;  Laterality: Left;   KNEE ARTHROPLASTY Right 01/27/2021   Procedure: COMPUTER ASSISTED TOTAL KNEE ARTHROPLASTY;  Surgeon: Donato Heinz, MD;  Location: [facility]  [facility];  Service: Orthopedics;  Laterality: Right;   MOUTH SURGERY     implants   TONSILLECTOMY     TOTAL HIP ARTHROPLASTY Left 04/15/2022   Procedure: TOTAL HIP ARTHROPLASTY;  Surgeon: Donato Heinz, MD;  Location: [facility];  Service: Orthopedics;  Laterality: Left;    Social History   Socioeconomic History   Marital status: Widowed    Spouse name: Not on file   Number of children: Not on file   Years of education: Not on file   Highest education level: Not on file  Occupational History   Not on file  Tobacco Use   Smoking status: Never   Smokeless tobacco: Never  Vaping Use   Vaping status: Never Used  Substance and Sexual Activity   Alcohol use: Never   Drug use: Never   Sexual activity: Not on file  Other Topics Concern   Not on file  Social History Narrative   Not on file   Social Drivers of Health   Financial Resource Strain: Low Risk  (12/01/2021)   Received from [facility], [facility]   Overall Financial Resource Strain (CARDIA)    Difficulty of Paying Living Expenses: Not hard at all  Food Insecurity: No Food Insecurity (09/03/2022)   Hunger Vital Sign  Worried About Programme researcher, broadcasting/film/video in the Last Year: Never true    Ran Out of Food in the Last Year: Never true  Transportation Needs: No Transportation Needs (09/03/2022)   PRAPARE - Administrator, Civil Service (Medical): No    Lack of Transportation (Non-Medical): No  Physical Activity: Not on file  Stress: Not on file  Social Connections: Not on file  Intimate Partner Violence: Not At Risk (09/03/2022)   Humiliation, Afraid, Rape, and Kick questionnaire    Fear of Current or Ex-Partner: No    Emotionally Abused: No    Physically Abused: No    Sexually Abused: No    Family History  Problem Relation Age of Onset   Breast cancer Neg Hx    No Known Allergies I? Current Outpatient Medications  Medication Sig Dispense Refill   acetaminophen (TYLENOL) 500  MG tablet Take 1,000 mg by mouth 2 (two) times daily.     alendronate (FOSAMAX) 70 MG tablet Take 70 mg by mouth once a week. Take with a full glass of water on an empty stomach. Wednesdays     amLODipine (NORVASC) 5 MG tablet Take 5 mg by mouth every evening.     calcium-vitamin D (OSCAL WITH D) 500-5 MG-MCG tablet Take 1 tablet by mouth daily with breakfast.     carvedilol (COREG) 6.25 MG tablet Take 6.25 mg by mouth 2 (two) times daily.     celecoxib (CELEBREX) 200 MG capsule Take 200 mg by mouth 2 (two) times daily.     cetirizine (ZYRTEC) 10 MG tablet Take 10 mg by mouth daily as needed for allergies.     doxycycline (VIBRA-TABS) 100 MG tablet Take 1 tablet (100 mg total) by mouth 2 (two) times daily. 60 tablet 5   DULoxetine HCl 60 MG CSDR Take 60 mg by mouth every morning.  2   gabapentin (NEURONTIN) 600 MG tablet Take 600-1,200 mg by mouth See admin instructions. Take 1200 mg in the morning and 600 mg noon and 1200 mg at bedtime  0   lisinopril (ZESTRIL) 40 MG tablet Take 40 mg by mouth every morning.     magnesium chloride (SLOW-MAG) 64 MG TBEC SR tablet Take 1 tablet (64 mg total) by mouth 2 (two) times daily. 60 tablet 0   Multiple Vitamins-Minerals (MULTIVITAMIN WITH MINERALS) tablet Take 1 tablet by mouth daily. Senior     ondansetron (ZOFRAN) 4 MG tablet Take 1 tablet (4 mg total) by mouth 2 (two) times daily as needed for nausea or vomiting. 30 tablet 2   pantoprazole (PROTONIX) 40 MG tablet Take 40 mg by mouth 2 (two) times daily.     traMADol (ULTRAM) 50 MG tablet Take 1-2 tablets (50-100 mg total) by mouth every 4 (four) hours as needed for moderate pain. 30 tablet 0   traZODone (DESYREL) 50 MG tablet Take 50 mg by mouth at bedtime.  5   No current facility-administered medications for this visit.     Abtx:  Anti-infectives (From admission, onward)    None       REVIEW OF SYSTEMS:  Const: negative fever, negative chills, negative weight loss Eyes: negative diplopia  or visual changes, negative eye pain ENT: negative coryza, negative sore throat Resp: negative cough, hemoptysis, dyspnea Cards: negative for chest pain, palpitations, lower extremity edema GU: negative for frequency, dysuria and hematuria GI: Negative for abdominal pain, diarrhea, bleeding, constipation Skin: negative for rash and pruritus Heme: negative for easy bruising and gum/nose  bleeding MS: weakness Neurolo:negative for headaches, dizziness, vertigo, memory problems  Psych: negative for feelings of anxiety, depression  Endocrine: negative for thyroid, diabetes Allergy/Immunology- negative for any medication or food allergies  Objective:  VITALS:  There were no vitals taken for this visit.  PHYSICAL EXAM:  General: Alert, cooperative, no distress, appears stated age.  Head: Normocephalic, without obvious abnormality, atraumatic. Eyes: Conjunctivae clear, anicteric sclerae. Pupils are equal ENT Nares normal. No drainage or sinus tenderness. Lips, mucosa, and tongue normal. No Thrush Neck: Supple, symmetrical, no adenopathy, thyroid: non tender no carotid bruit and no JVD. Back: No CVA tenderness. Lungs: Clear to auscultation bilaterally. No Wheezing or Rhonchi. No rales. Heart: Regular rate and rhythm, no murmur, rub or gallop. Abdomen: Soft, non-tender,not distended. Bowel sounds normal. No masses Extremities:        Skin: No rashes or lesions. Or bruising Lymph: Cervical, supraclavicular normal. Neurologic: Grossly non-focal Pertinent Labs 11/14 = ESR 273 ( was 70) CRP 0.5( was 14)   Impression/Recommendation Left hip prosthetic joint infection due to MRSA.  Underwent DAIR Patient finished 6 weeks IV daptomycin and POifampin on  10/14/2022. And then took  PO bactrim DS 1 BID along with Rifampin 300mg  PO BID for another 6 weeks. Now she is on Doxycycline 100mg  Po BID indefinetely Patient would like to know whether she could stop antibiotic.  But there is a risk  of recurrence because of retained hardware Discussed with Dr. Ernest Pine and he is in agreement with the plan She is not colonized with MRSA in the nares  Back pain lumbar radiculopathy L5-S1 ESI  Discussed the management with the patient  Will follow her in 6 months.

## 2023-03-28 IMAGING — MG MM DIGITAL SCREENING BILAT W/ TOMO AND CAD
8 series · 8 of 24 positions shown · non-contrast
Comparison: Previous exam(s).

CLINICAL DATA: Screening.

EXAM:
DIGITAL SCREENING BILATERAL MAMMOGRAM WITH TOMOSYNTHESIS AND CAD
TECHNIQUE: Bilateral screening digital craniocaudal and mediolateral oblique
mammograms were obtained. Bilateral screening digital breast
tomosynthesis was performed. The images were evaluated with
computer-aided detection.

[R CC synth-2D]
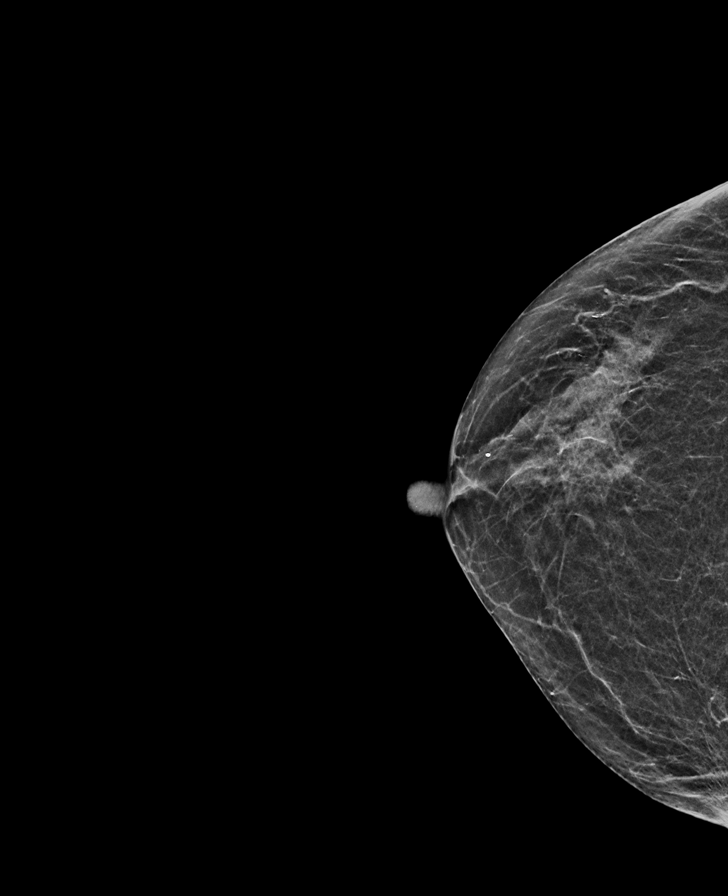

[L MLO synth-2D]
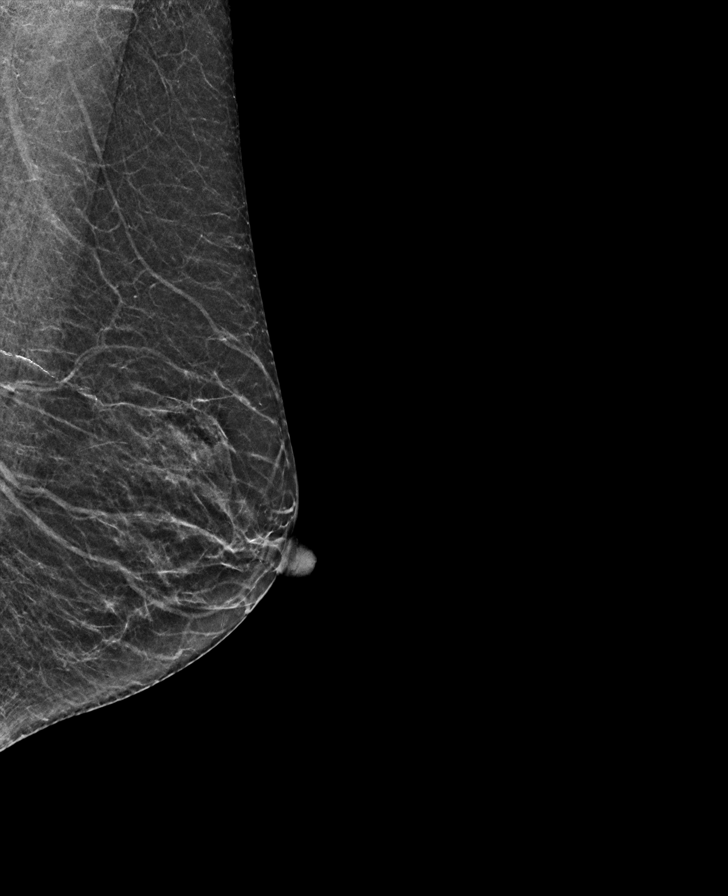

[R MLO synth-2D]
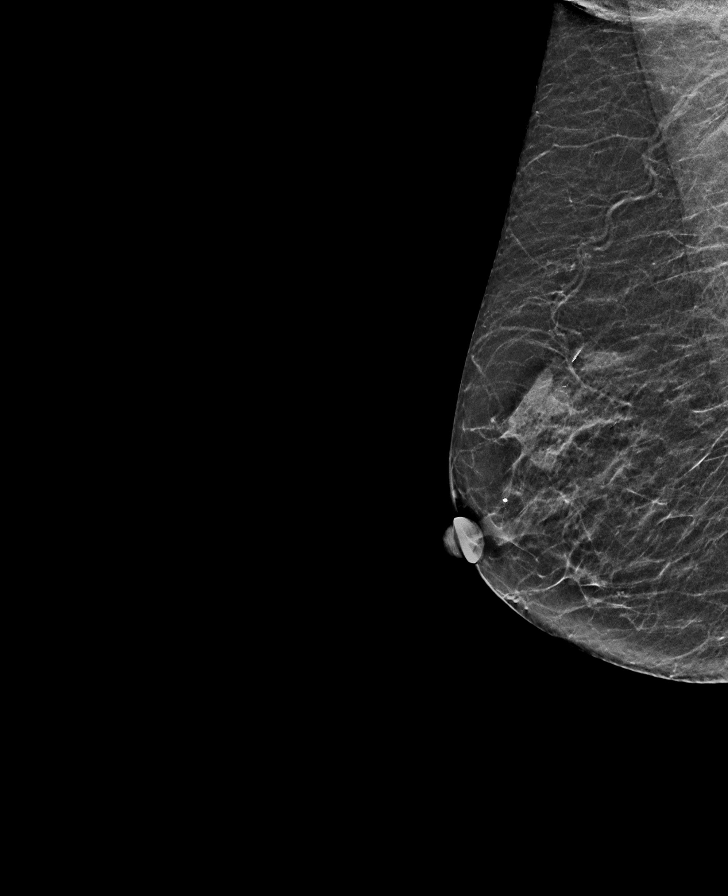

[L CC synth-2D]
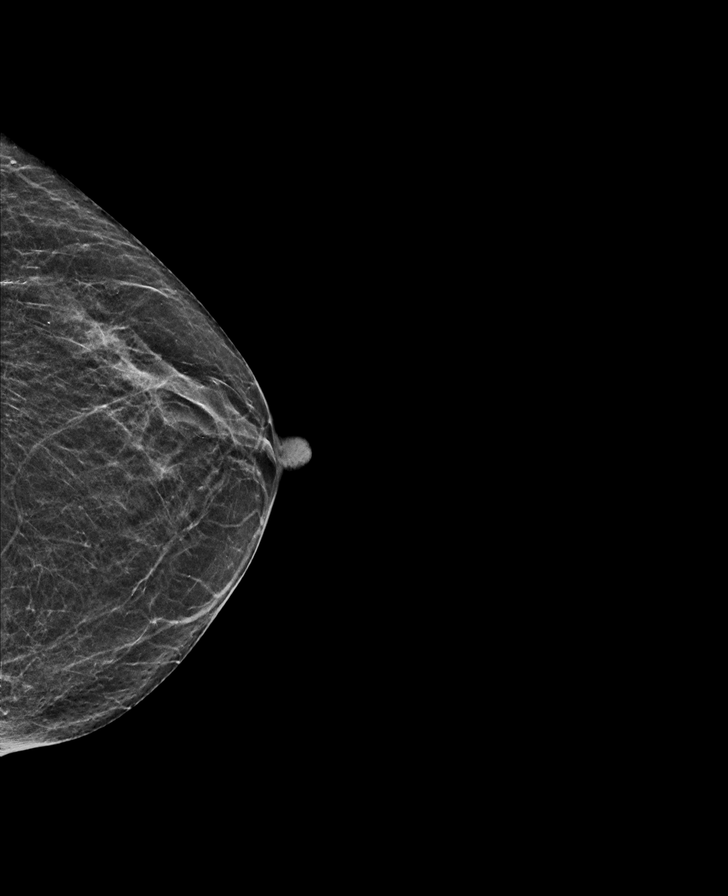

[L CC tomo · tomo slice 21/40.0]
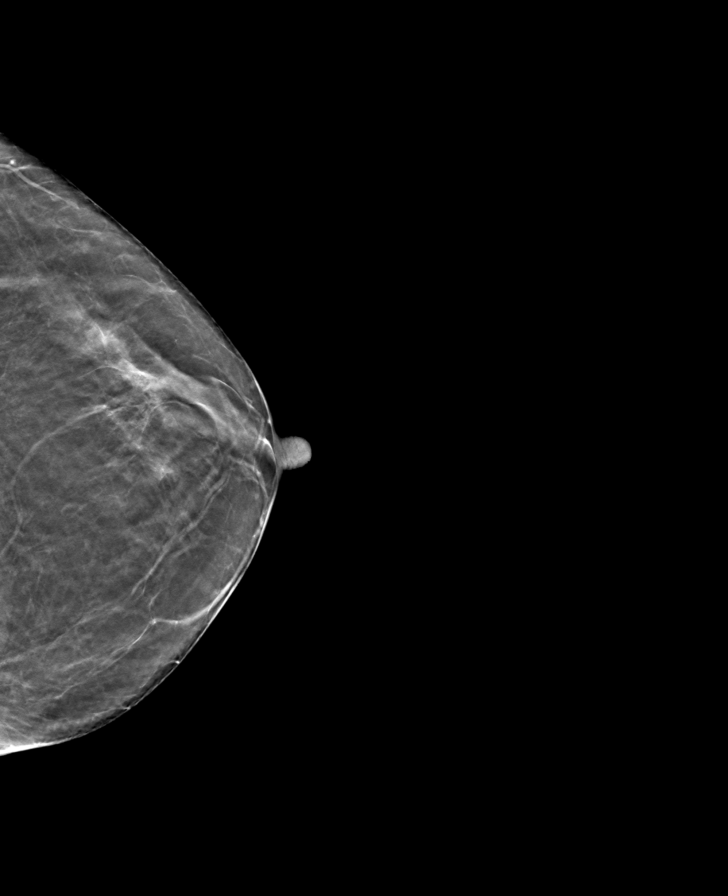

[R MLO tomo · tomo slice 22/43.0]
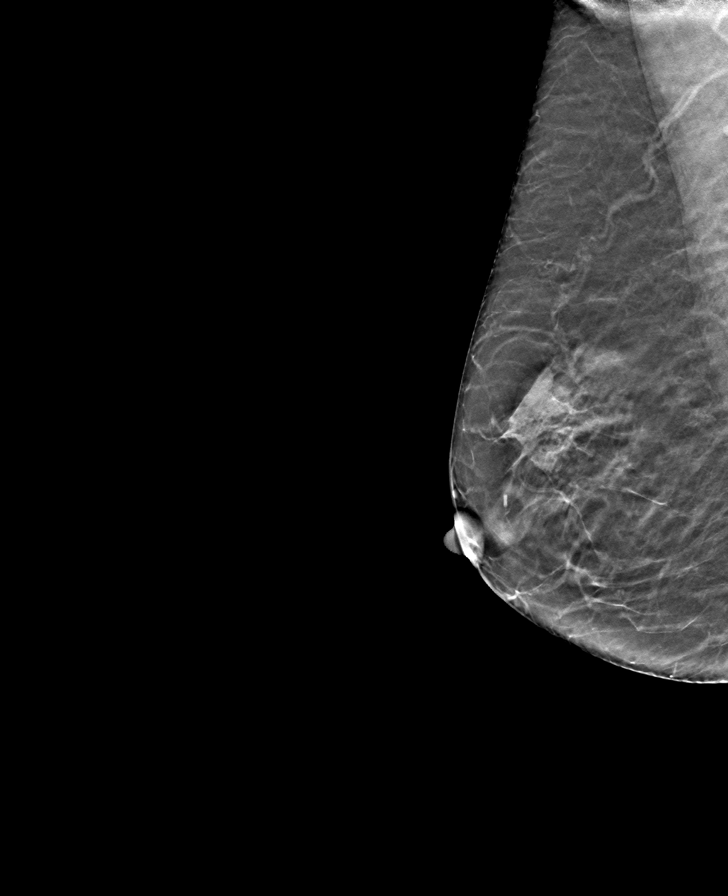

[R CC tomo · tomo slice 21/40.0]
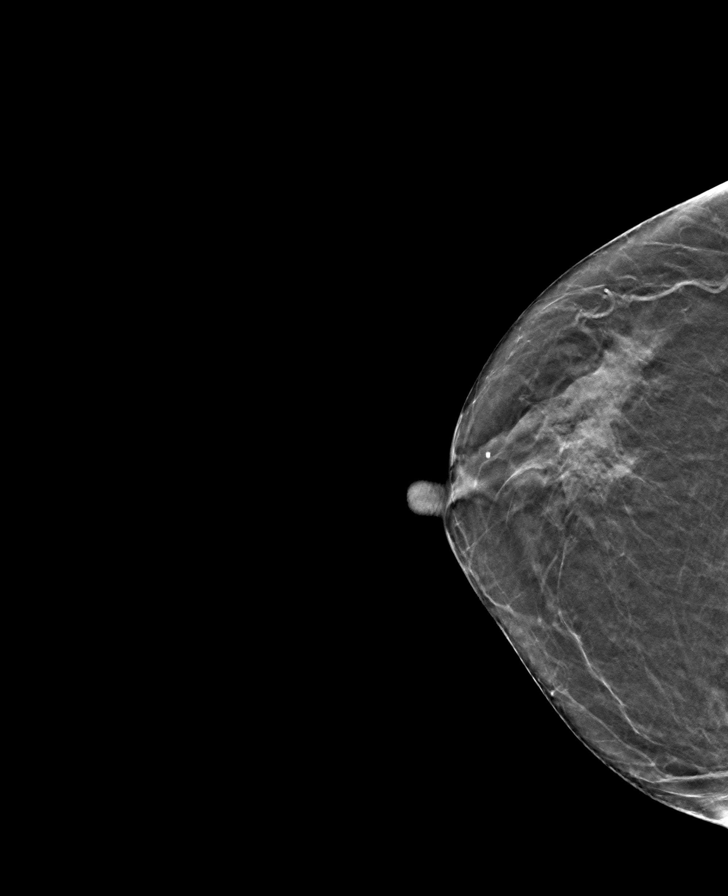

[L MLO tomo · tomo slice 21/41.0]
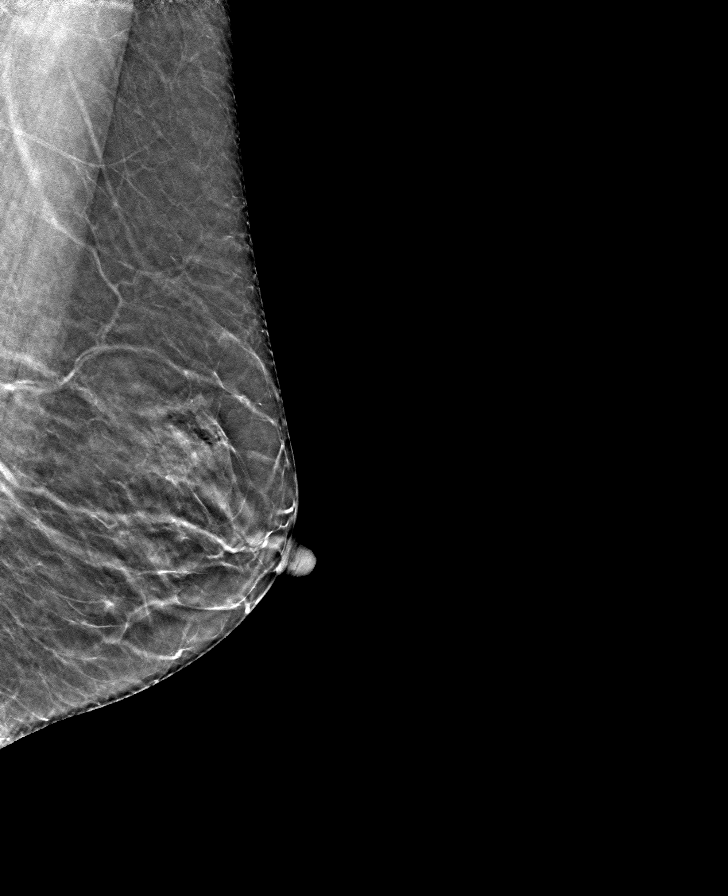

[8 of 24 positions shown; findings below may reference images not displayed]

ACR Breast Density Category b: There are scattered areas of
fibroglandular density.
FINDINGS: There are no findings suspicious for malignancy.
IMPRESSION: No mammographic evidence of malignancy. A result letter of this
screening mammogram will be mailed directly to the patient.

RECOMMENDATION:
Screening mammogram in one year. (Code:51-O-LD2)

BI-RADS CATEGORY  1: Negative.

## 2023-05-10 ENCOUNTER — Other Ambulatory Visit: Payer: Self-pay | Admitting: Infectious Diseases

## 2023-06-04 ENCOUNTER — Other Ambulatory Visit: Payer: Self-pay | Admitting: Infectious Diseases

## 2023-06-15 ENCOUNTER — Ambulatory Visit: Payer: Medicare Other | Admitting: Infectious Diseases

## 2023-06-26 ENCOUNTER — Other Ambulatory Visit: Payer: Self-pay | Admitting: Infectious Diseases

## 2023-07-16 ENCOUNTER — Other Ambulatory Visit: Payer: Self-pay | Admitting: Infectious Diseases

## 2023-07-17 ENCOUNTER — Other Ambulatory Visit: Payer: Self-pay | Admitting: Infectious Diseases

## 2023-08-04 ENCOUNTER — Other Ambulatory Visit: Payer: Self-pay | Admitting: Infectious Diseases

## 2023-08-24 ENCOUNTER — Encounter: Payer: Self-pay | Admitting: Infectious Diseases

## 2023-08-24 ENCOUNTER — Ambulatory Visit: Attending: Infectious Diseases | Admitting: Infectious Diseases

## 2023-08-24 VITALS — BP 123/72 | HR 74 | Temp 97.9°F | Ht 63.0 in | Wt 155.0 lb

## 2023-08-24 DIAGNOSIS — R110 Nausea: Secondary | ICD-10-CM | POA: Diagnosis not present

## 2023-08-24 DIAGNOSIS — E871 Hypo-osmolality and hyponatremia: Secondary | ICD-10-CM | POA: Insufficient documentation

## 2023-08-24 DIAGNOSIS — B9562 Methicillin resistant Staphylococcus aureus infection as the cause of diseases classified elsewhere: Secondary | ICD-10-CM | POA: Insufficient documentation

## 2023-08-24 DIAGNOSIS — Z79899 Other long term (current) drug therapy: Secondary | ICD-10-CM | POA: Insufficient documentation

## 2023-08-24 DIAGNOSIS — T8452XD Infection and inflammatory reaction due to internal left hip prosthesis, subsequent encounter: Secondary | ICD-10-CM | POA: Diagnosis not present

## 2023-08-24 DIAGNOSIS — Z96642 Presence of left artificial hip joint: Secondary | ICD-10-CM | POA: Diagnosis present

## 2023-08-24 DIAGNOSIS — M5416 Radiculopathy, lumbar region: Secondary | ICD-10-CM | POA: Diagnosis not present

## 2023-08-24 DIAGNOSIS — T8450XD Infection and inflammatory reaction due to unspecified internal joint prosthesis, subsequent encounter: Secondary | ICD-10-CM

## 2023-08-24 DIAGNOSIS — T8452XA Infection and inflammatory reaction due to internal left hip prosthesis, initial encounter: Secondary | ICD-10-CM | POA: Diagnosis not present

## 2023-08-24 DIAGNOSIS — Y838 Other surgical procedures as the cause of abnormal reaction of the patient, or of later complication, without mention of misadventure at the time of the procedure: Secondary | ICD-10-CM | POA: Diagnosis not present

## 2023-08-24 MED ORDER — ONDANSETRON HCL 4 MG PO TABS: 4.0000 mg | ORAL_TABLET | Freq: Two times a day (BID) | ORAL | 2 refills | Status: DC

## 2023-08-24 MED ORDER — DOXYCYCLINE HYCLATE 100 MG PO TABS: 100.0000 mg | ORAL_TABLET | Freq: Two times a day (BID) | ORAL | 2 refills | Status: DC

## 2023-08-24 NOTE — Progress Notes (Signed)
 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  She completed IV daptomycin  and Po rifampin  for 6 weeks and then took rifampin  plus Bactrim  for 6 more weeks and since 11/30/2022 she has been on p.o. doxycycline . Suppressive therapy  The wound has healed completely there is some puckering of the skin   Past Medical History:  Diagnosis Date   Abnormal EKG    Anemia    Arthritis    Depression    Fibromyalgia    GERD (gastroesophageal reflux disease)    History of hiatal hernia    Hypertension    Scoliosis    had trouble getting spinal in for TKA in 2022   Tachycardia     Past Surgical History:  Procedure Laterality Date   APPLICATION OF WOUND VAC Left 05/11/2022   Procedure: APPLICATION OF WOUND VAC  [id number];  Surgeon: Arlyne Lame, MD;  Location: [facility];  Service: Orthopedics;  Laterality: Left;  [id number]   CESAREAN SECTION     x 4   COSMETIC SURGERY     lip injury   FRACTURE SURGERY Right    foot ORIF-age 40   HERNIA REPAIR Left    inguinal   INCISION AND DRAINAGE Left 05/11/2022   Procedure: INCISION AND DRAINAGE EVACUATION HEMATOMA LEFT HIP;  Surgeon: Arlyne Lame, MD;  Location: [facility];  Service: Orthopedics;  Laterality: Left;   INCISION AND DRAINAGE HIP Left 09/02/2022   Procedure: IRRIGATION AND DEBRIDEMENT HIP WITH POLY EXCHANGE;  Surgeon: Arlyne Lame, MD;  Location: [facility];  Service: Orthopedics;  Laterality:  Left;   KNEE ARTHROPLASTY Right 01/27/2021   Procedure: COMPUTER ASSISTED TOTAL KNEE ARTHROPLASTY;  Surgeon: Arlyne Lame, MD;  Location: [facility];  Service: Orthopedics;  Laterality: Right;   MOUTH SURGERY     implants   TONSILLECTOMY     TOTAL HIP ARTHROPLASTY Left 04/15/2022   Procedure: TOTAL HIP ARTHROPLASTY;  Surgeon: Arlyne Lame, MD;  Location: [facility];  Service: Orthopedics;  Laterality: Left;    Social History   Socioeconomic History   Marital status: Widowed    Spouse name: Not on file   Number of children: Not on file   Years of education: Not on file   Highest education level: Not on file  Occupational History   Not on file  Tobacco Use   Smoking status: Never   Smokeless tobacco: Never  Vaping Use   Vaping status: Never Used  Substance and Sexual Activity   Alcohol use: Never   Drug use: Never   Sexual activity: Not on file  Other Topics Concern   Not on file  Social History Narrative   Not on file   Social Drivers of Health   Financial Resource Strain: Low Risk  (12/01/2021)   Received [facility], [facility]   Overall Financial Resource Strain (CARDIA)    Difficulty of Paying  Living Expenses: Not hard at all  Food Insecurity: No Food Insecurity (09/03/2022)   Hunger Vital Sign    Worried About Running Out of Food in the Last Year: Never true    Ran Out of Food in the Last Year: Never true  Transportation Needs: No Transportation Needs (09/03/2022)   PRAPARE - Administrator, Civil Service (Medical): No    Lack of Transportation (Non-Medical): No  Physical Activity: Not on file  Stress: Not on file  Social Connections: Not on file  Intimate Partner Violence: Not At Risk (09/03/2022)   Humiliation, Afraid, Rape, and Kick questionnaire    Fear of Current or Ex-Partner: No    Emotionally Abused: No    Physically Abused: No    Sexually Abused: No    Family History  Problem Relation Age of Onset    Breast cancer Neg Hx    No Known Allergies I? Current Outpatient Medications  Medication Sig Dispense Refill   acetaminophen  (TYLENOL ) 500 MG tablet Take 1,000 mg by mouth 2 (two) times daily.     alendronate (FOSAMAX) 70 MG tablet Take 70 mg by mouth once a week. Take with a full glass of water  on an empty stomach. Wednesdays     amLODipine  (NORVASC ) 5 MG tablet Take 5 mg by mouth every evening.     calcium-vitamin D  (OSCAL WITH D) 500-5 MG-MCG tablet Take 1 tablet by mouth daily with breakfast.     carvedilol  (COREG ) 6.25 MG tablet Take 6.25 mg by mouth 2 (two) times daily.     celecoxib  (CELEBREX ) 200 MG capsule Take 200 mg by mouth 2 (two) times daily.     cetirizine (ZYRTEC) 10 MG tablet Take 10 mg by mouth daily as needed for allergies.     doxycycline  (VIBRA -TABS) 100 MG tablet Take 1 tablet (100 mg total) by mouth 2 (two) times daily. 60 tablet 5   DULoxetine  HCl 60 MG CSDR Take 60 mg by mouth every morning.  2   gabapentin  (NEURONTIN ) 600 MG tablet Take 600-1,200 mg by mouth See admin instructions. Take 1200 mg in the morning and 600 mg noon and 1200 mg at bedtime  0   lisinopril  (ZESTRIL ) 40 MG tablet Take 40 mg by mouth every morning.     magnesium  chloride (SLOW-MAG) 64 MG TBEC SR tablet Take 1 tablet (64 mg total) by mouth 2 (two) times daily. 60 tablet 0   Multiple Vitamins-Minerals (MULTIVITAMIN WITH MINERALS) tablet Take 1 tablet by mouth daily. Senior     ondansetron  (ZOFRAN ) 4 MG tablet TAKE 1 TABLET BY MOUTH TWICE DAILY AS NEEDED FOR NAUSEA FOR VOMITING 30 tablet 0   pantoprazole  (PROTONIX ) 40 MG tablet Take 40 mg by mouth 2 (two) times daily.     traMADol  (ULTRAM ) 50 MG tablet Take 1-2 tablets (50-100 mg total) by mouth every 4 (four) hours as needed for moderate pain. 30 tablet 0   traZODone  (DESYREL ) 50 MG tablet Take 50 mg by mouth at bedtime.  5   No current facility-administered medications for this visit.     Abtx:  Anti-infectives (From admission, onward)     None       REVIEW OF SYSTEMS:  Const: negative fever, negative chills, negative weight loss Eyes: negative diplopia or visual changes, negative eye pain ENT: negative coryza, negative sore throat Resp: negative cough, hemoptysis, dyspnea Cards: negative for chest pain, palpitations, lower extremity edema GU: negative for frequency, dysuria and hematuria GI: Negative for abdominal pain,  diarrhea, bleeding, constipation Skin: negative for rash and pruritus Heme: negative for easy bruising and gum/nose bleeding MS: weakness Neurolo:negative for headaches, dizziness, vertigo, memory problems  Psych: recent death of family members- pt is sad Endocrine: negative for thyroid, diabetes Allergy/Immunology- negative for any medication or food allergies  Objective:  VITALS:  BP 123/72   Pulse 74   Temp 97.9 F (36.6 C) (Temporal)   Ht 5\' 3"  (1.6 m)   Wt 155 lb (70.3 kg)   SpO2 97%   BMI 27.46 kg/m    PHYSICAL EXAM:  General: Alert, cooperative, no distress, appears stated age.  Lips, mucosa, and tongue normal. No Thrush Neck: Supple, symmetrical, no adenopathy, thyroid: non tender no carotid bruit and no JVD. Back: No CVA tenderness. Lungs: Clear to auscultation bilaterally. No Wheezing or Rhonchi. No rales. Heart: Regular rate and rhythm, no murmur, rub or gallop. Abdomen: not examined Left hip scar tissue- puckered skin Skin: No rashes or lesions. Or bruising Lymph: Cervical, supraclavicular normal. 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  Underwent DAIR June 2024 Patient finished 6 weeks IV daptomycin  and POifampin on  10/14/2022. And then took  PO bactrim  DS 1 BID along with Rifampin  300mg  PO BID for another 6 weeks. Now she is on Doxycycline  100mg  Po BID sicne 11/30/22 She is month 10 into her SAT She would like to  look at stopping antibiotics We discussed lack of  guidelines regarding PJI with MRSA and stopping SAT -- We can monitor closely ESR/CRP and her symptoms if we decide to stop- She has an appt with Dr,Hooten later this month- As I am away for the rest of the month - we will discuss again in July    Back pain lumbar radiculopathy L5-S1 ESI on 07/27/23   Hyponatremia likely due to SSRI    Will follow her in 6 months.

## 2023-08-24 NOTE — Patient Instructions (Signed)
 You are ehre for follow up for the left hip PJI with MRSA- the hardware is still present- you ate on month 9 of syuppressive doxy therapy- you would like to see whether we can stop it and closely monitor your symptoms and check ESR- we will try to do this in 2 months- will dsicuss with Dr.Hooten as well.

## 2023-08-30 ENCOUNTER — Encounter: Payer: Self-pay | Admitting: Infectious Diseases

## 2023-08-30 NOTE — Telephone Encounter (Signed)
 Good morning,  It was sent on 08/24/23 with 2 refills. Your pharmacy should have it on file. Please let me know if they don't.

## 2023-08-31 ENCOUNTER — Ambulatory Visit: Admitting: Infectious Diseases

## 2023-10-14 ENCOUNTER — Ambulatory Visit: Attending: Infectious Diseases | Admitting: Infectious Diseases

## 2023-10-14 DIAGNOSIS — T8452XD Infection and inflammatory reaction due to internal left hip prosthesis, subsequent encounter: Secondary | ICD-10-CM

## 2023-10-14 DIAGNOSIS — B9562 Methicillin resistant Staphylococcus aureus infection as the cause of diseases classified elsewhere: Secondary | ICD-10-CM

## 2023-10-14 MED ORDER — DOXYCYCLINE HYCLATE 100 MG PO TABS: 100.0000 mg | ORAL_TABLET | Freq: Two times a day (BID) | ORAL | 8 refills | Status: AC

## 2023-10-14 MED ORDER — ONDANSETRON HCL 4 MG PO TABS: 4.0000 mg | ORAL_TABLET | Freq: Two times a day (BID) | ORAL | 6 refills | Status: AC

## 2023-10-14 NOTE — Progress Notes (Signed)
 Called patient. Left hip PJI with MRSA She is on suppressive doxy therapy along with ondansetron  PRN   Discussed with Dr.Hooten. plan is to keep her indefinitely on suppressive therapy Follow up March 2026

## 2023-12-23 ENCOUNTER — Encounter: Payer: Self-pay | Admitting: Ophthalmology

## 2023-12-23 NOTE — Discharge Instructions (Signed)

## 2023-12-23 NOTE — Anesthesia Preprocedure Evaluation (Addendum)
 Anesthesia Evaluation  Patient identified by MRN, date of birth, ID band Patient awake    Reviewed: Allergy & Precautions, H&P , NPO status , Patient's Chart, lab work & pertinent test results  Airway Mallampati: IV  TM Distance: <3 FB Neck ROM: Full  Mouth opening: Limited Mouth Opening  Dental no notable dental hx. (+) Implants States all her teeth are implants:   Pulmonary neg pulmonary ROS   Pulmonary exam normal breath sounds clear to auscultation       Cardiovascular hypertension, negative cardio ROS Normal cardiovascular exam Rhythm:Regular Rate:Normal     Neuro/Psych  PSYCHIATRIC DISORDERS Anxiety Depression     Neuromuscular disease negative neurological ROS  negative psych ROS   GI/Hepatic negative GI ROS, Neg liver ROS, hiatal hernia,GERD  ,,  Endo/Other  negative endocrine ROS    Renal/GU negative Renal ROS  negative genitourinary   Musculoskeletal negative musculoskeletal ROS (+) Arthritis ,  Fibromyalgia -  Abdominal   Peds negative pediatric ROS (+)  Hematology negative hematology ROS (+) Blood dyscrasia, anemia   Anesthesia Other Findings Arthritis Hypertension Fibromyalgia History of hiatal hernia Anemia Tachycardia Depression GERD (gastroesophageal reflux disease) Scoliosis Abnormal EKG Anxiety      Reproductive/Obstetrics negative OB ROS                              Anesthesia Physical Anesthesia Plan  ASA: 3  Anesthesia Plan: MAC   Post-op Pain Management:    Induction: Intravenous  PONV Risk Score and Plan:   Airway Management Planned: Natural Airway and Nasal Cannula  Additional Equipment:   Intra-op Plan:   Post-operative Plan:   Informed Consent: I have reviewed the patients History and Physical, chart, labs and discussed the procedure including the risks, benefits and alternatives for the proposed anesthesia with the patient or authorized  representative who has indicated his/her understanding and acceptance.     Dental Advisory Given  Plan Discussed with: Anesthesiologist, CRNA and Surgeon  Anesthesia Plan Comments: (Patient consented for risks of anesthesia including but not limited to:  - adverse reactions to medications - damage to eyes, teeth, lips or other oral mucosa - nerve damage due to positioning  - sore throat or hoarseness - Damage to heart, brain, nerves, lungs, other parts of body or loss of life  Patient voiced understanding and assent.)         Anesthesia Quick Evaluation

## 2023-12-27 ENCOUNTER — Other Ambulatory Visit: Payer: Self-pay

## 2023-12-27 ENCOUNTER — Ambulatory Visit
Admission: RE | Admit: 2023-12-27 | Discharge: 2023-12-27 | Disposition: A | Discharge status: Home / Self Care | Attending: Ophthalmology | Admitting: Ophthalmology

## 2023-12-27 ENCOUNTER — Ambulatory Visit: Payer: Self-pay | Admitting: Anesthesiology

## 2023-12-27 ENCOUNTER — Encounter
Admission: RE | Disposition: A | Discharge status: Home / Self Care | Payer: Self-pay | Source: Home / Self Care | Attending: Ophthalmology

## 2023-12-27 ENCOUNTER — Encounter: Payer: Self-pay | Admitting: Ophthalmology

## 2023-12-27 DIAGNOSIS — I10 Essential (primary) hypertension: Secondary | ICD-10-CM | POA: Insufficient documentation

## 2023-12-27 DIAGNOSIS — M1990 Unspecified osteoarthritis, unspecified site: Secondary | ICD-10-CM | POA: Diagnosis not present

## 2023-12-27 DIAGNOSIS — H2512 Age-related nuclear cataract, left eye: Secondary | ICD-10-CM | POA: Diagnosis present

## 2023-12-27 DIAGNOSIS — F32A Depression, unspecified: Secondary | ICD-10-CM | POA: Insufficient documentation

## 2023-12-27 DIAGNOSIS — F419 Anxiety disorder, unspecified: Secondary | ICD-10-CM | POA: Insufficient documentation

## 2023-12-27 DIAGNOSIS — M797 Fibromyalgia: Secondary | ICD-10-CM | POA: Insufficient documentation

## 2023-12-27 DIAGNOSIS — H25012 Cortical age-related cataract, left eye: Secondary | ICD-10-CM | POA: Diagnosis present

## 2023-12-27 HISTORY — DX: Chronic pain syndrome: G89.4

## 2023-12-27 HISTORY — DX: Anxiety disorder, unspecified: F41.9

## 2023-12-27 HISTORY — DX: Other symptoms and signs involving the nervous system: R29.818

## 2023-12-27 HISTORY — DX: Alcohol dependence, in remission: F10.21

## 2023-12-27 HISTORY — DX: Opioid use, unspecified, uncomplicated: F11.90

## 2023-12-27 HISTORY — DX: Opioid abuse with intoxication, unspecified: F11.129

## 2023-12-27 HISTORY — PX: CATARACT EXTRACTION W/PHACO: SHX586

## 2023-12-27 HISTORY — DX: Other psychoactive substance dependence, uncomplicated: F19.20

## 2023-12-27 SURGERY — PHACOEMULSIFICATION, CATARACT, WITH IOL INSERTION
Anesthesia: Monitor Anesthesia Care | Site: Eye | Laterality: Left

## 2023-12-27 MED ORDER — ARMC OPHTHALMIC DILATING DROPS
1.0000 | OPHTHALMIC | Status: DC | PRN
Start: 2023-12-27 — End: 2023-12-27
  Administered 2023-12-27 (×2): 1 via OPHTHALMIC

## 2023-12-27 MED ORDER — FENTANYL CITRATE (PF) 100 MCG/2ML IJ SOLN
INTRAMUSCULAR | Status: AC
  Filled 2023-12-27: qty 2

## 2023-12-27 MED ORDER — MIDAZOLAM HCL 2 MG/2ML IJ SOLN
INTRAMUSCULAR | Status: DC | PRN
  Administered 2023-12-27: 1 mg via INTRAVENOUS

## 2023-12-27 MED ORDER — FENTANYL CITRATE (PF) 100 MCG/2ML IJ SOLN
INTRAMUSCULAR | Status: DC | PRN
  Administered 2023-12-27: 50 ug via INTRAVENOUS

## 2023-12-27 MED ORDER — TETRACAINE HCL 0.5 % OP SOLN
1.0000 [drp] | OPHTHALMIC | Status: DC | PRN
Start: 2023-12-27 — End: 2023-12-27
  Administered 2023-12-27 (×3): 1 [drp] via OPHTHALMIC

## 2023-12-27 MED ORDER — ARMC OPHTHALMIC DILATING DROPS
OPHTHALMIC | Status: AC
  Filled 2023-12-27: qty 0.5

## 2023-12-27 MED ORDER — MIDAZOLAM HCL 2 MG/2ML IJ SOLN
INTRAMUSCULAR | Status: AC
  Filled 2023-12-27: qty 2

## 2023-12-27 MED ORDER — MOXIFLOXACIN HCL 0.5 % OP SOLN
OPHTHALMIC | Status: DC | PRN
  Administered 2023-12-27: .2 mL via OPHTHALMIC

## 2023-12-27 MED ORDER — TETRACAINE HCL 0.5 % OP SOLN
OPHTHALMIC | Status: AC
  Filled 2023-12-27: qty 4

## 2023-12-27 MED ORDER — SIGHTPATH DOSE#1 BSS IO SOLN
INTRAOCULAR | Status: DC | PRN
  Administered 2023-12-27: 15 mL via INTRAOCULAR

## 2023-12-27 MED ORDER — LACTATED RINGERS IV SOLN: INTRAVENOUS | Status: DC

## 2023-12-27 MED ORDER — SIGHTPATH DOSE#1 BSS IO SOLN
INTRAOCULAR | Status: DC | PRN
  Administered 2023-12-27: 82 mL via OPHTHALMIC

## 2023-12-27 MED ORDER — SIGHTPATH DOSE#1 NA HYALUR & NA CHOND-NA HYALUR IO KIT
PACK | INTRAOCULAR | Status: DC | PRN
  Administered 2023-12-27: 1 via OPHTHALMIC

## 2023-12-27 MED ORDER — LIDOCAINE HCL (PF) 2 % IJ SOLN
INTRAOCULAR | Status: DC | PRN
  Administered 2023-12-27: 4 mL via INTRAOCULAR

## 2023-12-27 SURGICAL SUPPLY — 9 items
DISSECTOR HYDRO NUCLEUS 50X22 (MISCELLANEOUS) ×1 IMPLANT
FEE CATARACT SUITE SIGHTPATH (MISCELLANEOUS) ×1 IMPLANT
GLOVE PI ULTRA LF STRL 7.5 (GLOVE) ×1 IMPLANT
GLOVE SURG SYN 6.5 PF PI BL (GLOVE) ×1 IMPLANT
GLOVE SURG SYN 8.5 PF PI BL (GLOVE) ×1 IMPLANT
LENS IOL TECNIS EYHANCE 22.0 (Intraocular Lens) IMPLANT
NDL FILTER BLUNT 18X1 1/2 (NEEDLE) ×1 IMPLANT
NEEDLE FILTER BLUNT 18X1 1/2 (NEEDLE) ×1 IMPLANT
SYR 3ML LL SCALE MARK (SYRINGE) ×1 IMPLANT

## 2023-12-27 NOTE — Transfer of Care (Signed)
 Immediate Anesthesia Transfer of Care Note  Patient: Gina Matthews  Procedure(s) Performed: PHACOEMULSIFICATION, CATARACT, WITH IOL INSERTION 3.23 00:26.9 (Left: Eye)  Patient Location: PACU  Anesthesia Type: MAC  Level of Consciousness: awake, alert  and patient cooperative  Airway and Oxygen Therapy: Patient Spontanous Breathing and Patient connected to supplemental oxygen  Post-op Assessment: Post-op Vital signs reviewed, Patient's Cardiovascular Status Stable, Respiratory Function Stable, Patent Airway and No signs of Nausea or vomiting  Post-op Vital Signs: Reviewed and stable  Complications: No notable events documented.

## 2023-12-27 NOTE — Op Note (Signed)
 OPERATIVE NOTE  Gina Matthews [id number] 12/27/2023   PREOPERATIVE DIAGNOSIS:  Nuclear sclerotic cataract left eye.  H25.12   POSTOPERATIVE DIAGNOSIS:    Nuclear sclerotic cataract left eye.     PROCEDURE:  Phacoemusification with posterior chamber intraocular lens placement of the left eye   LENS:   Implant Name Type Inv. Item Serial No. Manufacturer Lot No. LRB No. Used Action  LENS IOL TECNIS EYHANCE 22.0 - [id number] Intraocular Lens LENS IOL TECNIS EYHANCE 22.0 [id number] [employer]  Left 1 Implanted      Procedure(s): PHACOEMULSIFICATION, CATARACT, WITH IOL INSERTION 3.23 00:26.9 (Left)  SURGEON:  Adine Novak, MD, MPH   ANESTHESIA:  Topical with tetracaine drops augmented with 1% preservative-free intracameral lidocaine .  ESTIMATED BLOOD LOSS: <1 mL   COMPLICATIONS:  None.   DESCRIPTION OF PROCEDURE:  The patient was identified in the holding room and transported to the operating room and placed in the supine position under the operating microscope.  The left eye was identified as the operative eye and it was prepped and draped in the usual sterile ophthalmic fashion.   A 1.0 millimeter clear-corneal paracentesis was made at the 5:00 position. 0.5 ml of preservative-free 1% lidocaine  with epinephrine was injected into the anterior chamber.  The anterior chamber was filled with viscoelastic.  A 2.4 millimeter keratome was used to make a near-clear corneal incision at the 2:00 position.  A curvilinear capsulorrhexis was made with a cystotome and capsulorrhexis forceps.  Balanced salt solution was used to hydrodissect and hydrodelineate the nucleus.   Phacoemulsification was then used in stop and chop fashion to remove the lens nucleus and epinucleus.  The remaining cortex was then removed using the irrigation and aspiration handpiece. Viscoelastic was then placed into the capsular bag to distend it for lens placement.  A lens was then injected into the capsular bag.  The  remaining viscoelastic was aspirated.   Wounds were hydrated with balanced salt solution.  The anterior chamber was inflated to a physiologic pressure with balanced salt solution.  Intracameral vigamox 0.1 mL undiltued was injected into the eye and a drop placed onto the ocular surface.  No wound leaks were noted.  The patient was taken to the recovery room in stable condition without complications of anesthesia or surgery  Adine Novak 12/27/2023, 9:55 AM

## 2023-12-27 NOTE — Anesthesia Postprocedure Evaluation (Signed)
 Anesthesia Post Note  Patient: Gina Matthews  Procedure(s) Performed: PHACOEMULSIFICATION, CATARACT, WITH IOL INSERTION 3.23 00:26.9 (Left: Eye)  Patient location during evaluation: PACU Anesthesia Type: MAC Level of consciousness: awake and alert Pain management: pain level controlled Vital Signs Assessment: post-procedure vital signs reviewed and stable Respiratory status: spontaneous breathing, nonlabored ventilation, respiratory function stable and patient connected to nasal cannula oxygen Cardiovascular status: stable and blood pressure returned to baseline Postop Assessment: no apparent nausea or vomiting Anesthetic complications: no   No notable events documented.   Last Vitals:  Vitals:   12/27/23 0958 12/27/23 1000  BP: 124/86 114/77  Pulse: 69 65  Resp: 15 17  Temp: 36.8 C   SpO2: 95% 100%    Last Pain:  Vitals:   12/27/23 0958  TempSrc:   PainSc: 0-No pain                 Donny BROCKS Audiel Scheiber

## 2023-12-27 NOTE — H&P (Signed)
 [facility]   Primary Care Physician:  Delfina Pao, MD Ophthalmologist: Dr. Adine Novak  Pre-Procedure History & Physical: HPI:  Gina Matthews is a 73 y.o. female here for cataract surgery.   Past Medical History:  Diagnosis Date   Abnormal EKG    Alcohol use disorder, severe, in sustained remission (HCC)    Anemia    Anxiety    Arthritis    Chronic pain syndrome    Depression    Dextromethorphan use disorder, severe, dependence (HCC)    Fibromyalgia    GERD (gastroesophageal reflux disease)    History of hiatal hernia    Hypertension    Neurogenic claudication    Opioid abuse with intoxication (HCC)    Opioid use disorder    Scoliosis    had trouble getting spinal in for TKA in 2022   Tachycardia     Past Surgical History:  Procedure Laterality Date   APPLICATION OF WOUND VAC Left 05/11/2022   Procedure: APPLICATION OF WOUND VAC  [id number];  Surgeon: Mardee Lynwood SQUIBB, MD;  Location: [facility];  Service: Orthopedics;  Laterality: Left;  [id number]   CESAREAN SECTION     x 4   COSMETIC SURGERY     lip injury   FRACTURE SURGERY Right    foot ORIF-age 59   HERNIA REPAIR Left    inguinal   INCISION AND DRAINAGE Left 05/11/2022   Procedure: INCISION AND DRAINAGE EVACUATION HEMATOMA LEFT HIP;  Surgeon: Mardee Lynwood SQUIBB, MD;  Location: [facility];  Service: Orthopedics;  Laterality: Left;   INCISION AND DRAINAGE HIP Left 09/02/2022   Procedure: IRRIGATION AND DEBRIDEMENT HIP WITH POLY EXCHANGE;  Surgeon: Mardee Lynwood SQUIBB, MD;  Location: [facility];  Service: Orthopedics;  Laterality: Left;   KNEE ARTHROPLASTY Right 01/27/2021   Procedure: COMPUTER ASSISTED TOTAL KNEE ARTHROPLASTY;  Surgeon: Mardee Lynwood SQUIBB, MD;  Location: [facility];  Service: Orthopedics;  Laterality: Right;   MOUTH SURGERY     implants   TONSILLECTOMY     TOTAL HIP ARTHROPLASTY Left 04/15/2022   Procedure: TOTAL HIP ARTHROPLASTY;  Surgeon: Mardee Lynwood SQUIBB, MD;  Location: [facility];  Service: Orthopedics;   Laterality: Left;    Prior to Admission medications   Medication Sig Start Date End Date Taking? Authorizing Provider  acetaminophen  (TYLENOL ) 500 MG tablet Take 1,000 mg by mouth 2 (two) times daily.   Yes [provider]  alendronate (FOSAMAX) 70 MG tablet Take 70 mg by mouth once a week. Take with a full glass of water  on an empty stomach. Wednesdays   Yes [provider]  amLODipine  (NORVASC ) 10 MG tablet Take 10 mg by mouth daily.   Yes [provider]  baclofen (LIORESAL) 10 MG tablet Take 5 mg by mouth at bedtime.   Yes [provider]  Calcium Carbonate-Vitamin D  (OS-CAL 500 + D PO) Take 1 tablet by mouth daily.   Yes [provider]  calcium-vitamin D  (OSCAL WITH D) 500-5 MG-MCG tablet Take 1 tablet by mouth daily with breakfast.   Yes [provider]  carvedilol  (COREG ) 6.25 MG tablet Take 6.25 mg by mouth 2 (two) times daily. 12/05/20  Yes [provider]  celecoxib  (CELEBREX ) 200 MG capsule Take 200 mg by mouth 2 (two) times daily. 11/10/22  Yes [provider]  cetirizine (ZYRTEC) 10 MG tablet Take 10 mg by mouth daily as needed for allergies.   Yes [provider]  doxycycline  (VIBRA -TABS) 100 MG tablet Take 1 tablet (  100 mg total) by mouth 2 (two) times daily. 10/14/23  Yes Fayette Bodily, MD  DULoxetine  HCl 60 MG CSDR Take 60 mg by mouth every morning. 05/21/16  Yes [provider]  famotidine  (PEPCID ) 20 MG tablet Take 20 mg by mouth as needed for heartburn or indigestion.   Yes [provider]  gabapentin  (NEURONTIN ) 600 MG tablet Take 600-1,200 mg by mouth See admin instructions. Take 1200 mg in the morning and 600 mg noon and 1200 mg at bedtime 04/03/16  Yes [provider]  hydrOXYzine (ATARAX) 25 MG tablet Take 25 mg by mouth every 6 (six) hours as needed.   Yes [provider]  lisinopril  (ZESTRIL ) 40 MG tablet Take 40 mg by mouth every morning. 12/18/20   Yes [provider]  loperamide (IMODIUM A-D) 2 MG tablet Take 2 mg by mouth 4 (four) times daily as needed for diarrhea or loose stools.   Yes [provider]  magnesium  chloride (SLOW-MAG) 64 MG TBEC SR tablet Take 1 tablet (64 mg total) by mouth 2 (two) times daily. 09/07/22  Yes Fausto Burnard LABOR, DO  Multiple Vitamins-Minerals (MULTIVITAMIN WITH MINERALS) tablet Take 1 tablet by mouth daily. Senior   Yes [provider]  pantoprazole  (PROTONIX ) 40 MG tablet Take 40 mg by mouth 2 (two) times daily.   Yes [provider]  prochlorperazine (COMPAZINE) 10 MG tablet Take 10 mg by mouth every 6 (six) hours as needed for nausea or vomiting.   Yes [provider]  traMADol  (ULTRAM ) 50 MG tablet Take by mouth every 6 (six) hours as needed.   Yes [provider]  traZODone  (DESYREL ) 50 MG tablet Take 50 mg by mouth at bedtime. 05/18/16  Yes [provider]  ondansetron  (ZOFRAN ) 4 MG tablet Take 1 tablet (4 mg total) by mouth 2 (two) times daily. Patient taking differently: Take 4 mg by mouth as needed. 10/14/23   Fayette Bodily, MD    Allergies as of 12/13/2023   (No Known Allergies)    Family History  Problem Relation Age of Onset   Breast cancer Neg Hx     Social History   Socioeconomic History   Marital status: Widowed    Spouse name: Not on file   Number of children: Not on file   Years of education: Not on file   Highest education level: Not on file  Occupational History   Not on file  Tobacco Use   Smoking status: Never   Smokeless tobacco: Never  Vaping Use   Vaping status: Never Used  Substance and Sexual Activity   Alcohol use: Never   Drug use: Yes    Types: Marijuana    Comment: when 20   Sexual activity: Not on file  Other Topics Concern   Not on file  Social History Narrative   Not on file   Social Drivers of Health   Financial Resource Strain: Low Risk  (12/02/2023)   Received from [facility]   Overall Financial Resource Strain (CARDIA)    How hard is it for you to pay for the very basics like food, housing, medical care, and heating?: Not hard at all  Food Insecurity: No Food Insecurity (12/06/2023)   Received from [facility]   Hunger Vital Sign    Within the past 12 months, you worried that your food would run out before you got the money to buy more.: Never true    Within the past 12  months, the food you bought just didn't last and you didn't have money to get more.: Never true  Transportation Needs: No Transportation Needs (12/06/2023)   Received from [facility] - Transportation    In the past 12 months, has lack of transportation kept you from medical appointments or from getting medications?: No    Lack of Transportation (Non-Medical): No  Physical Activity: Not on file  Stress: Not on file  Social Connections: Not on file  Intimate Partner Violence: Not At Risk (09/03/2022)   Humiliation, Afraid, Rape, and Kick questionnaire    Fear of Current or Ex-Partner: No    Emotionally Abused: No    Physically Abused: No    Sexually Abused: No    Review of Systems: See HPI, otherwise negative ROS  Physical Exam: BP 133/78   Pulse 69   Temp (!) 97.1 F (36.2 C) (Temporal)   Ht 5' 3 (1.6 m)   Wt 70.3 kg   SpO2 99%   BMI 27.46 kg/m  General:   Alert, cooperative. Head:  Normocephalic and atraumatic. Respiratory:  Normal work of breathing. Cardiovascular:  NAD  Impression/Plan: Gina Matthews is here for cataract surgery.  Risks, benefits, limitations, and alternatives regarding cataract surgery have been reviewed with the patient.  Questions have been answered.  All parties agreeable.   Adine Novak, MD  12/27/2023, 9:27 AM

## 2023-12-28 ENCOUNTER — Encounter: Payer: Self-pay | Admitting: Ophthalmology

## 2024-01-03 ENCOUNTER — Encounter: Payer: Self-pay | Admitting: Ophthalmology

## 2024-01-04 NOTE — Anesthesia Preprocedure Evaluation (Addendum)
 Anesthesia Evaluation  Patient identified by MRN, date of birth, ID band Patient awake    Reviewed: Allergy & Precautions, H&P , NPO status , Patient's Chart, lab work & pertinent test results  Airway Mallampati: IV  TM Distance: <3 FB Neck ROM: Full    Dental no notable dental hx. (+) Implants  States all her teeth are implants  :   Pulmonary neg pulmonary ROS   Pulmonary exam normal breath sounds clear to auscultation       Cardiovascular hypertension, negative cardio ROS Normal cardiovascular exam Rhythm:Regular Rate:Normal     Neuro/Psych  PSYCHIATRIC DISORDERS Anxiety Depression     Neuromuscular disease negative neurological ROS  negative psych ROS   GI/Hepatic negative GI ROS, Neg liver ROS, hiatal hernia,GERD  ,,  Endo/Other  negative endocrine ROS    Renal/GU negative Renal ROS  negative genitourinary   Musculoskeletal negative musculoskeletal ROS (+) Arthritis ,  Fibromyalgia -  Abdominal   Peds negative pediatric ROS (+)  Hematology negative hematology ROS (+) Blood dyscrasia, anemia   Anesthesia Other Findings Previous cataract 12-27-23 Dr. Ola   Arthritis Hypertension Fibromyalgia History of hiatal hernia Anemia Tachycardia Depression GERD (gastroesophageal reflux disease) Scoliosis Abnormal EKG Anxiety                   Reproductive/Obstetrics negative OB ROS                              Anesthesia Physical Anesthesia Plan  ASA: 3  Anesthesia Plan: MAC   Post-op Pain Management:    Induction: Intravenous  PONV Risk Score and Plan:   Airway Management Planned: Natural Airway and Nasal Cannula  Additional Equipment:   Intra-op Plan:   Post-operative Plan:   Informed Consent: I have reviewed the patients History and Physical, chart, labs and discussed the procedure including the risks, benefits and alternatives for the proposed anesthesia with  the patient or authorized representative who has indicated his/her understanding and acceptance.     Dental Advisory Given  Plan Discussed with: Anesthesiologist, CRNA and Surgeon  Anesthesia Plan Comments: (Patient consented for risks of anesthesia including but not limited to:  - adverse reactions to medications - damage to eyes, teeth, lips or other oral mucosa - nerve damage due to positioning  - sore throat or hoarseness - Damage to heart, brain, nerves, lungs, other parts of body or loss of life  Patient voiced understanding and assent.)         Anesthesia Quick Evaluation

## 2024-01-05 NOTE — Discharge Instructions (Signed)

## 2024-01-10 ENCOUNTER — Ambulatory Visit: Payer: Self-pay | Admitting: Anesthesiology

## 2024-01-10 ENCOUNTER — Encounter
Admission: RE | Disposition: A | Discharge status: Home / Self Care | Payer: Self-pay | Source: Home / Self Care | Attending: Ophthalmology

## 2024-01-10 ENCOUNTER — Ambulatory Visit
Admission: RE | Admit: 2024-01-10 | Discharge: 2024-01-10 | Disposition: A | Discharge status: Home / Self Care | Attending: Ophthalmology | Admitting: Ophthalmology

## 2024-01-10 ENCOUNTER — Other Ambulatory Visit: Payer: Self-pay

## 2024-01-10 ENCOUNTER — Encounter: Payer: Self-pay | Admitting: Ophthalmology

## 2024-01-10 DIAGNOSIS — I10 Essential (primary) hypertension: Secondary | ICD-10-CM | POA: Insufficient documentation

## 2024-01-10 DIAGNOSIS — H2511 Age-related nuclear cataract, right eye: Secondary | ICD-10-CM | POA: Diagnosis present

## 2024-01-10 DIAGNOSIS — K219 Gastro-esophageal reflux disease without esophagitis: Secondary | ICD-10-CM | POA: Diagnosis not present

## 2024-01-10 DIAGNOSIS — H25011 Cortical age-related cataract, right eye: Secondary | ICD-10-CM | POA: Diagnosis present

## 2024-01-10 HISTORY — PX: CATARACT EXTRACTION W/PHACO: SHX586

## 2024-01-10 SURGERY — PHACOEMULSIFICATION, CATARACT, WITH IOL INSERTION
Anesthesia: Monitor Anesthesia Care | Site: Eye | Laterality: Right

## 2024-01-10 MED ORDER — MOXIFLOXACIN HCL 0.5 % OP SOLN
OPHTHALMIC | Status: DC | PRN
  Administered 2024-01-10: .2 mL via OPHTHALMIC

## 2024-01-10 MED ORDER — TETRACAINE HCL 0.5 % OP SOLN
1.0000 [drp] | OPHTHALMIC | Status: DC | PRN
  Administered 2024-01-10 (×3): 1 [drp] via OPHTHALMIC

## 2024-01-10 MED ORDER — SIGHTPATH DOSE#1 NA HYALUR & NA CHOND-NA HYALUR IO KIT
PACK | INTRAOCULAR | Status: DC | PRN
  Administered 2024-01-10: 1 via OPHTHALMIC

## 2024-01-10 MED ORDER — TETRACAINE HCL 0.5 % OP SOLN
OPHTHALMIC | Status: AC
  Filled 2024-01-10: qty 4

## 2024-01-10 MED ORDER — SIGHTPATH DOSE#1 BSS IO SOLN
INTRAOCULAR | Status: DC | PRN
  Administered 2024-01-10: 15 mL via INTRAOCULAR

## 2024-01-10 MED ORDER — MIDAZOLAM HCL (PF) 2 MG/2ML IJ SOLN
INTRAMUSCULAR | Status: DC | PRN
  Administered 2024-01-10: 2 mg via INTRAVENOUS

## 2024-01-10 MED ORDER — LIDOCAINE HCL (PF) 2 % IJ SOLN
INTRAOCULAR | Status: DC | PRN
  Administered 2024-01-10: 4 mL via INTRAOCULAR

## 2024-01-10 MED ORDER — MIDAZOLAM HCL 2 MG/2ML IJ SOLN
INTRAMUSCULAR | Status: AC
  Filled 2024-01-10: qty 2

## 2024-01-10 MED ORDER — FENTANYL CITRATE (PF) 100 MCG/2ML IJ SOLN
INTRAMUSCULAR | Status: DC | PRN
  Administered 2024-01-10: 50 ug via INTRAVENOUS

## 2024-01-10 MED ORDER — SIGHTPATH DOSE#1 BSS IO SOLN
INTRAOCULAR | Status: DC | PRN
  Administered 2024-01-10: 80 mL via OPHTHALMIC

## 2024-01-10 MED ORDER — ARMC OPHTHALMIC DILATING DROPS
OPHTHALMIC | Status: AC
  Filled 2024-01-10: qty 0.5

## 2024-01-10 MED ORDER — FENTANYL CITRATE (PF) 100 MCG/2ML IJ SOLN
INTRAMUSCULAR | Status: AC
  Filled 2024-01-10: qty 2

## 2024-01-10 MED ORDER — ARMC OPHTHALMIC DILATING DROPS
1.0000 | OPHTHALMIC | Status: DC | PRN
  Administered 2024-01-10 (×3): 1 via OPHTHALMIC

## 2024-01-10 MED ORDER — LACTATED RINGERS IV SOLN
INTRAVENOUS | Status: DC
Start: 2024-01-10 — End: 2024-01-10

## 2024-01-10 SURGICAL SUPPLY — 9 items
DISSECTOR HYDRO NUCLEUS 50X22 (MISCELLANEOUS) ×1 IMPLANT
FEE CATARACT SUITE SIGHTPATH (MISCELLANEOUS) ×1 IMPLANT
GLOVE PI ULTRA LF STRL 7.5 (GLOVE) ×1 IMPLANT
GLOVE SURG SYN 6.5 PF PI BL (GLOVE) ×1 IMPLANT
GLOVE SURG SYN 8.5 PF PI BL (GLOVE) ×1 IMPLANT
LENS IOL TECNIS EYHANCE 20.0 (Intraocular Lens) IMPLANT
NDL FILTER BLUNT 18X1 1/2 (NEEDLE) ×1 IMPLANT
NEEDLE FILTER BLUNT 18X1 1/2 (NEEDLE) ×1 IMPLANT
SYR 3ML LL SCALE MARK (SYRINGE) ×1 IMPLANT

## 2024-01-10 NOTE — Op Note (Signed)
 OPERATIVE NOTE  Makaley Storts [id number] 01/10/2024   PREOPERATIVE DIAGNOSIS:  Nuclear sclerotic cataract right eye.  H25.11   POSTOPERATIVE DIAGNOSIS:    Nuclear sclerotic cataract right eye.     PROCEDURE:  Phacoemusification with posterior chamber intraocular lens placement of the right eye   LENS:   Implant Name Type Inv. Item Serial No. Manufacturer Lot No. LRB No. Used Action  LENS IOL TECNIS EYHANCE 20.0 - [id number] Intraocular Lens LENS IOL TECNIS EYHANCE 20.0 [id number] [employer]  Right 1 Implanted       Procedure(s): PHACOEMULSIFICATION, CATARACT, WITH IOL INSERTION 4.59 00:39.0 (Right)  SURGEON:  Adine Novak, MD, MPH  ANESTHESIOLOGIST: Anesthesiologist: Ola Donny BROCKS, MD CRNA: Myra Lawless, CRNA   ANESTHESIA:  Topical with tetracaine drops augmented with 1% preservative-free intracameral lidocaine .  ESTIMATED BLOOD LOSS: less than 1 mL.   COMPLICATIONS:  None.   DESCRIPTION OF PROCEDURE:  The patient was identified in the holding room and transported to the operating room and placed in the supine position under the operating microscope.  The right eye was identified as the operative eye and it was prepped and draped in the usual sterile ophthalmic fashion.   A 1.0 millimeter clear-corneal paracentesis was made at the 10:30 position. 0.5 ml of preservative-free 1% lidocaine  with epinephrine was injected into the anterior chamber.  The anterior chamber was filled with viscoelastic.  A 2.4 millimeter keratome was used to make a near-clear corneal incision at the 8:00 position.  A curvilinear capsulorrhexis was made with a cystotome and capsulorrhexis forceps.  Balanced salt solution was used to hydrodissect and hydrodelineate the nucleus.   Phacoemulsification was then used in stop and chop fashion to remove the lens nucleus and epinucleus.  The remaining cortex was then removed using the irrigation and aspiration handpiece. Viscoelastic was then placed into the  capsular bag to distend it for lens placement.  A lens was then injected into the capsular bag.  The remaining viscoelastic was aspirated.   Wounds were hydrated with balanced salt solution.  The anterior chamber was inflated to a physiologic pressure with balanced salt solution.   Intracameral vigamox 0.1 mL undiluted was injected into the eye and a drop placed onto the ocular surface.  No wound leaks were noted.  The patient was taken to the recovery room in stable condition without complications of anesthesia or surgery  Adine Novak 01/10/2024, 11:02 AM

## 2024-01-10 NOTE — Anesthesia Postprocedure Evaluation (Signed)
 Anesthesia Post Note  Patient: Gina Matthews  Procedure(s) Performed: PHACOEMULSIFICATION, CATARACT, WITH IOL INSERTION 4.59 00:39.0 (Right: Eye)  Patient location during evaluation: PACU Anesthesia Type: MAC Level of consciousness: awake and alert Pain management: pain level controlled Vital Signs Assessment: post-procedure vital signs reviewed and stable Respiratory status: spontaneous breathing, nonlabored ventilation, respiratory function stable and patient connected to nasal cannula oxygen Cardiovascular status: stable and blood pressure returned to baseline Postop Assessment: no apparent nausea or vomiting Anesthetic complications: no   No notable events documented.   Last Vitals:  Vitals:   01/10/24 1103 01/10/24 1108  BP: (!) 80/60 (!) 92/59  Pulse: 68 67  Resp: 18 17  Temp: (!) 36.2 C (!) 36.4 C  SpO2: 94% 98%    Last Pain:  Vitals:   01/10/24 1108  TempSrc:   PainSc: 0-No pain                 Nechama Escutia C Lindley Stachnik

## 2024-01-10 NOTE — H&P (Signed)
 [facility]   Primary Care Physician:  Delfina Pao, MD Ophthalmologist: Dr. Adine Novak  Pre-Procedure History & Physical: HPI:  Gina Matthews is a 73 y.o. female here for cataract surgery.   Past Medical History:  Diagnosis Date   Abnormal EKG    Alcohol use disorder, severe, in sustained remission (HCC)    Anemia    Anxiety    Arthritis    Chronic pain syndrome    Depression    Dextromethorphan use disorder, severe, dependence (HCC)    Fibromyalgia    GERD (gastroesophageal reflux disease)    History of hiatal hernia    Hypertension    Neurogenic claudication    Opioid abuse with intoxication (HCC)    Opioid use disorder    Scoliosis    had trouble getting spinal in for TKA in 2022   Tachycardia     Past Surgical History:  Procedure Laterality Date   APPLICATION OF WOUND VAC Left 05/11/2022   Procedure: APPLICATION OF WOUND VAC  [id number];  Surgeon: Mardee Lynwood SQUIBB, MD;  Location: [facility];  Service: Orthopedics;  Laterality: Left;  [id number]   CATARACT EXTRACTION W/PHACO Left 12/27/2023   Procedure: PHACOEMULSIFICATION, CATARACT, WITH IOL INSERTION 3.23 00:26.9;  Surgeon: Novak Adine Anes, MD;  Location: [facility];  Service: Ophthalmology;  Laterality: Left;   CESAREAN SECTION     x 4   COSMETIC SURGERY     lip injury   FRACTURE SURGERY Right    foot ORIF-age 3   HERNIA REPAIR Left    inguinal   INCISION AND DRAINAGE Left 05/11/2022   Procedure: INCISION AND DRAINAGE EVACUATION HEMATOMA LEFT HIP;  Surgeon: Mardee Lynwood SQUIBB, MD;  Location: [facility];  Service: Orthopedics;  Laterality: Left;   INCISION AND DRAINAGE HIP Left 09/02/2022   Procedure: IRRIGATION AND DEBRIDEMENT HIP WITH POLY EXCHANGE;  Surgeon: Mardee Lynwood SQUIBB, MD;  Location: [facility];  Service: Orthopedics;  Laterality: Left;   KNEE ARTHROPLASTY Right 01/27/2021   Procedure: COMPUTER ASSISTED TOTAL KNEE ARTHROPLASTY;  Surgeon: Mardee Lynwood SQUIBB, MD;  Location: [facility];  Service:  Orthopedics;  Laterality: Right;   MOUTH SURGERY     implants   TONSILLECTOMY     TOTAL HIP ARTHROPLASTY Left 04/15/2022   Procedure: TOTAL HIP ARTHROPLASTY;  Surgeon: Mardee Lynwood SQUIBB, MD;  Location: [facility];  Service: Orthopedics;  Laterality: Left;    Prior to Admission medications   Medication Sig Start Date End Date Taking? Authorizing Provider  acetaminophen  (TYLENOL ) 500 MG tablet Take 1,000 mg by mouth 2 (two) times daily.   Yes [provider]  alendronate (FOSAMAX) 70 MG tablet Take 70 mg by mouth once a week. Take with a full glass of water  on an empty stomach. Wednesdays   Yes [provider]  amLODipine  (NORVASC ) 10 MG tablet Take 10 mg by mouth daily.   Yes [provider]  Calcium Carbonate-Vitamin D  (OS-CAL 500 + D PO) Take 1 tablet by mouth daily.   Yes [provider]  calcium-vitamin D  (OSCAL WITH D) 500-5 MG-MCG tablet Take 1 tablet by mouth daily with breakfast.   Yes [provider]  carvedilol  (COREG ) 6.25 MG tablet Take 6.25 mg by mouth 2 (two) times daily. 12/05/20  Yes [provider]  celecoxib  (CELEBREX ) 200 MG capsule Take 200 mg by mouth 2 (two) times daily. 11/10/22  Yes [provider]  cetirizine (ZYRTEC) 10 MG tablet Take 10 mg by mouth daily as needed for allergies.  Yes [provider]  doxycycline  (VIBRA -TABS) 100 MG tablet Take 1 tablet (100 mg total) by mouth 2 (two) times daily. 10/14/23  Yes Fayette Bodily, MD  DULoxetine  HCl 60 MG CSDR Take 60 mg by mouth every morning. 05/21/16  Yes [provider]  famotidine  (PEPCID ) 20 MG tablet Take 20 mg by mouth as needed for heartburn or indigestion.   Yes [provider]  gabapentin  (NEURONTIN ) 600 MG tablet Take 600-1,200 mg by mouth See admin instructions. Take 1200 mg in the morning and 600 mg noon and 1200 mg at bedtime 04/03/16  Yes [provider]  hydrOXYzine (ATARAX) 25 MG tablet Take 25 mg by mouth  every 6 (six) hours as needed.   Yes [provider]  lisinopril  (ZESTRIL ) 40 MG tablet Take 40 mg by mouth every morning. 12/18/20  Yes [provider]  loperamide (IMODIUM A-D) 2 MG tablet Take 2 mg by mouth 4 (four) times daily as needed for diarrhea or loose stools.   Yes [provider]  magnesium  chloride (SLOW-MAG) 64 MG TBEC SR tablet Take 1 tablet (64 mg total) by mouth 2 (two) times daily. 09/07/22  Yes Fausto Burnard LABOR, DO  Multiple Vitamins-Minerals (MULTIVITAMIN WITH MINERALS) tablet Take 1 tablet by mouth daily. Senior   Yes [provider]  ondansetron  (ZOFRAN ) 4 MG tablet Take 1 tablet (4 mg total) by mouth 2 (two) times daily. Patient taking differently: Take 4 mg by mouth as needed. 10/14/23  Yes Fayette Bodily, MD  pantoprazole  (PROTONIX ) 40 MG tablet Take 40 mg by mouth 2 (two) times daily.   Yes [provider]  prochlorperazine (COMPAZINE) 10 MG tablet Take 10 mg by mouth every 6 (six) hours as needed for nausea or vomiting.   Yes [provider]  traMADol  (ULTRAM ) 50 MG tablet Take by mouth every 6 (six) hours as needed.   Yes [provider]  traZODone  (DESYREL ) 50 MG tablet Take 50 mg by mouth at bedtime. 05/18/16  Yes [provider]  baclofen (LIORESAL) 10 MG tablet Take 5 mg by mouth at bedtime.    [provider]    Allergies as of 12/13/2023   (No Known Allergies)    Family History  Problem Relation Age of Onset   Breast cancer Neg Hx     Social History   Socioeconomic History   Marital status: Widowed    Spouse name: Not on file   Number of children: Not on file   Years of education: Not on file   Highest education level: Not on file  Occupational History   Not on file  Tobacco Use   Smoking status: Never   Smokeless tobacco: Never  Vaping Use   Vaping status: Never Used  Substance and Sexual Activity   Alcohol use: Never   Drug use: Yes    Types: Marijuana     Comment: when 20   Sexual activity: Not on file  Other Topics Concern   Not on file  Social History Narrative   Not on file   Social Drivers of Health   Financial Resource Strain: Low Risk  (12/02/2023)   [facility] from [facility]   Overall Financial Resource Strain (CARDIA)    How hard is it for you to pay for the very basics like food, housing, medical care, and heating?: Not hard at all  Food Insecurity: No Food Insecurity (12/06/2023)   Received from [facility]   Hunger Vital Sign  Within the past 12 months, you worried that your food would run out before you got the money to buy more.: Never true    Within the past 12 months, the food you bought just didn't last and you didn't have money to get more.: Never true  Transportation Needs: No Transportation Needs (12/06/2023)   Received from [facility] - Transportation    In the past 12 months, has lack of transportation kept you from medical appointments or from getting medications?: No    Lack of Transportation (Non-Medical): No  Physical Activity: Not on file  Stress: Not on file  Social Connections: Not on file  Intimate Partner Violence: Not At Risk (09/03/2022)   Humiliation, Afraid, Rape, and Kick questionnaire    Fear of Current or Ex-Partner: No    Emotionally Abused: No    Physically Abused: No    Sexually Abused: No    Review of Systems: See HPI, otherwise negative ROS  Physical Exam: BP 102/70   Pulse 76   Temp (!) 97.4 F (36.3 C) (Temporal)   Resp 19   Ht 5' 3 (1.6 m)   Wt 68.9 kg   SpO2 98%   BMI 26.93 kg/m  General:   Alert, cooperative. Head:  Normocephalic and atraumatic. Respiratory:  Normal work of breathing. Cardiovascular:  NAD  Impression/Plan: Gina Matthews is here for cataract surgery.  Risks, benefits, limitations, and alternatives regarding cataract surgery have been reviewed with the patient.  Questions have been answered.  All  parties agreeable.   Adine Novak, MD  01/10/2024, 10:37 AM

## 2024-01-10 NOTE — Transfer of Care (Signed)
 Immediate Anesthesia Transfer of Care Note  Patient: [name]  Procedure(s) Performed: PHACOEMULSIFICATION, CATARACT, WITH IOL INSERTION 4.59 00:39.0 (Right: Eye)  Patient Location: PACU  Anesthesia Type: MAC  Level of Consciousness: awake, alert  and patient cooperative  Airway and Oxygen Therapy: Patient Spontanous Breathing and Patient connected to supplemental oxygen  Post-op Assessment: Post-op Vital signs reviewed, Patient's Cardiovascular Status Stable, Respiratory Function Stable, Patent Airway and No signs of Nausea or vomiting  Post-op Vital Signs: Reviewed and stable  Complications: No notable events documented.

## 2024-02-11 ENCOUNTER — Other Ambulatory Visit: Payer: Self-pay | Admitting: Pediatrics

## 2024-02-11 DIAGNOSIS — Z1231 Encounter for screening mammogram for malignant neoplasm of breast: Secondary | ICD-10-CM

## 2024-03-29 ENCOUNTER — Ambulatory Visit
Admission: RE | Admit: 2024-03-29 | Discharge: 2024-03-29 | Disposition: A | Discharge status: Home / Self Care | Source: Ambulatory Visit | Attending: Pediatrics | Admitting: Pediatrics

## 2024-03-29 DIAGNOSIS — Z1231 Encounter for screening mammogram for malignant neoplasm of breast: Secondary | ICD-10-CM | POA: Diagnosis present

## 2024-06-06 ENCOUNTER — Ambulatory Visit: Admitting: Infectious Diseases
